# Patient Record
Sex: Female | Born: 1950 | Race: White | Hispanic: No | Marital: Married | Smoking: Former smoker
Health system: Southern US, Community
[De-identification: ages and names within clinical notes are randomized; demographics above are authoritative.]

## PROBLEM LIST (undated history)

## (undated) DIAGNOSIS — M810 Age-related osteoporosis without current pathological fracture: Secondary | ICD-10-CM

## (undated) DIAGNOSIS — E785 Hyperlipidemia, unspecified: Secondary | ICD-10-CM

## (undated) DIAGNOSIS — J449 Chronic obstructive pulmonary disease, unspecified: Secondary | ICD-10-CM

## (undated) DIAGNOSIS — T4145XA Adverse effect of unspecified anesthetic, initial encounter: Secondary | ICD-10-CM

## (undated) DIAGNOSIS — F32A Depression, unspecified: Secondary | ICD-10-CM

## (undated) DIAGNOSIS — C3490 Malignant neoplasm of unspecified part of unspecified bronchus or lung: Secondary | ICD-10-CM

## (undated) DIAGNOSIS — T8859XA Other complications of anesthesia, initial encounter: Secondary | ICD-10-CM

## (undated) DIAGNOSIS — F329 Major depressive disorder, single episode, unspecified: Secondary | ICD-10-CM

## (undated) HISTORY — DX: Hyperlipidemia, unspecified: E78.5

## (undated) HISTORY — PX: CHOLECYSTECTOMY: SHX55

## (undated) HISTORY — DX: Age-related osteoporosis without current pathological fracture: M81.0

## (undated) HISTORY — DX: Major depressive disorder, single episode, unspecified: F32.9

## (undated) HISTORY — DX: Adverse effect of unspecified anesthetic, initial encounter: T41.45XA

## (undated) HISTORY — DX: Depression, unspecified: F32.A

## (undated) HISTORY — DX: Chronic obstructive pulmonary disease, unspecified: J44.9

## (undated) HISTORY — DX: Malignant neoplasm of unspecified part of unspecified bronchus or lung: C34.90

## (undated) HISTORY — PX: POLYPECTOMY: SHX149

## (undated) HISTORY — PX: VAGINAL HYSTERECTOMY: SUR661

## (undated) HISTORY — DX: Other complications of anesthesia, initial encounter: T88.59XA

---

## 2003-01-18 ENCOUNTER — Ambulatory Visit (HOSPITAL_COMMUNITY): Admission: RE | Admit: 2003-01-18 | Discharge: 2003-01-18 | Payer: Self-pay | Admitting: Family Medicine

## 2003-01-18 ENCOUNTER — Encounter: Payer: Self-pay | Admitting: Family Medicine

## 2003-02-23 ENCOUNTER — Encounter (HOSPITAL_COMMUNITY): Admission: RE | Admit: 2003-02-23 | Discharge: 2003-03-25 | Payer: Self-pay | Admitting: Family Medicine

## 2004-11-09 ENCOUNTER — Ambulatory Visit: Payer: Self-pay | Admitting: Internal Medicine

## 2006-06-29 DIAGNOSIS — E785 Hyperlipidemia, unspecified: Secondary | ICD-10-CM | POA: Insufficient documentation

## 2006-06-29 DIAGNOSIS — F329 Major depressive disorder, single episode, unspecified: Secondary | ICD-10-CM

## 2006-06-30 ENCOUNTER — Ambulatory Visit: Payer: Self-pay | Admitting: Internal Medicine

## 2006-06-30 DIAGNOSIS — R634 Abnormal weight loss: Secondary | ICD-10-CM

## 2006-06-30 LAB — CONVERTED CEMR LAB
ALT: 12 units/L (ref 0–40)
AST: 20 units/L (ref 0–37)
Albumin: 4.3 g/dL (ref 3.5–5.2)
Alkaline Phosphatase: 74 units/L (ref 39–117)
BUN: 8 mg/dL (ref 6–23)
Basophils Absolute: 0 10*3/uL (ref 0.0–0.1)
Basophils Relative: 0.4 % (ref 0.0–1.0)
Bilirubin, Direct: 0.1 mg/dL (ref 0.0–0.3)
CO2: 32 meq/L (ref 19–32)
Calcium: 9.8 mg/dL (ref 8.4–10.5)
Chloride: 104 meq/L (ref 96–112)
Chol/HDL Ratio, serum: 4
Cholesterol, target level: 200 mg/dL
Cholesterol: 214 mg/dL (ref 0–200)
Creatinine, Ser: 0.8 mg/dL (ref 0.4–1.2)
Eosinophil percent: 0.8 % (ref 0.0–5.0)
GFR calc non Af Amer: 79 mL/min
Glomerular Filtration Rate, Af Am: 95 mL/min/{1.73_m2}
Glucose, Bld: 86 mg/dL (ref 70–99)
HCT: 47.2 % — ABNORMAL HIGH (ref 36.0–46.0)
HDL goal, serum: 40 mg/dL
HDL: 54.1 mg/dL (ref >39.0)
Hemoglobin: 15.8 g/dL — ABNORMAL HIGH (ref 12.0–15.0)
LDL DIRECT: 136.8 mg/dL
LDL Goal: 130 mg/dL
Lymphocytes Relative: 20.7 % (ref 12.0–46.0)
MCHC: 33.6 g/dL (ref 30.0–36.0)
MCV: 87 fL (ref 78.0–100.0)
Monocytes Absolute: 0.4 10*3/uL (ref 0.2–0.7)
Monocytes Relative: 4.6 % (ref 3.0–11.0)
Neutro Abs: 6.3 10*3/uL (ref 1.4–7.7)
Neutrophils Relative %: 73.5 % (ref 43.0–77.0)
Platelets: 311 10*3/uL (ref 150–400)
Potassium: 4.2 meq/L (ref 3.5–5.1)
RBC: 5.42 M/uL — ABNORMAL HIGH (ref 3.87–5.11)
RDW: 12.6 % (ref 11.5–14.6)
Sodium: 142 meq/L (ref 135–145)
TSH: 0.6 microintl units/mL (ref 0.35–5.50)
Total Bilirubin: 1 mg/dL (ref 0.3–1.2)
Total Protein: 7.9 g/dL (ref 6.0–8.3)
Triglyceride fasting, serum: 123 mg/dL (ref 0–149)
VLDL: 25 mg/dL (ref 0–40)
WBC: 8.6 10*3/uL (ref 4.5–10.5)

## 2008-08-19 DIAGNOSIS — C3490 Malignant neoplasm of unspecified part of unspecified bronchus or lung: Secondary | ICD-10-CM

## 2008-08-19 HISTORY — PX: LUNG CANCER SURGERY: SHX702

## 2008-08-19 HISTORY — DX: Malignant neoplasm of unspecified part of unspecified bronchus or lung: C34.90

## 2008-11-11 ENCOUNTER — Encounter: Payer: Self-pay | Admitting: Internal Medicine

## 2008-12-24 ENCOUNTER — Inpatient Hospital Stay (HOSPITAL_COMMUNITY): Admission: EM | Admit: 2008-12-24 | Discharge: 2008-12-28 | Payer: Self-pay | Admitting: Internal Medicine

## 2008-12-24 ENCOUNTER — Ambulatory Visit: Payer: Self-pay | Admitting: Internal Medicine

## 2008-12-26 ENCOUNTER — Encounter: Payer: Self-pay | Admitting: Internal Medicine

## 2008-12-27 ENCOUNTER — Encounter (INDEPENDENT_AMBULATORY_CARE_PROVIDER_SITE_OTHER): Payer: Self-pay | Admitting: Interventional Radiology

## 2008-12-27 ENCOUNTER — Encounter: Payer: Self-pay | Admitting: Internal Medicine

## 2008-12-30 ENCOUNTER — Ambulatory Visit: Payer: Self-pay | Admitting: Internal Medicine

## 2009-01-02 ENCOUNTER — Ambulatory Visit: Payer: Self-pay | Admitting: Internal Medicine

## 2009-01-05 ENCOUNTER — Ambulatory Visit: Payer: Self-pay | Admitting: Internal Medicine

## 2009-01-05 DIAGNOSIS — Z85118 Personal history of other malignant neoplasm of bronchus and lung: Secondary | ICD-10-CM

## 2009-01-05 DIAGNOSIS — Z08 Encounter for follow-up examination after completed treatment for malignant neoplasm: Secondary | ICD-10-CM

## 2009-01-06 ENCOUNTER — Encounter: Payer: Self-pay | Admitting: Internal Medicine

## 2009-01-10 ENCOUNTER — Ambulatory Visit (HOSPITAL_COMMUNITY): Admission: RE | Admit: 2009-01-10 | Discharge: 2009-01-10 | Payer: Self-pay | Admitting: Internal Medicine

## 2009-01-10 LAB — COMPREHENSIVE METABOLIC PANEL
ALT: 10 U/L (ref 0–35)
CO2: 29 mEq/L (ref 19–32)
Calcium: 9.8 mg/dL (ref 8.4–10.5)
Chloride: 103 mEq/L (ref 96–112)
Creatinine, Ser: 0.71 mg/dL (ref 0.40–1.20)
Total Protein: 7.3 g/dL (ref 6.0–8.3)

## 2009-01-10 LAB — CBC WITH DIFFERENTIAL/PLATELET
BASO%: 0.4 % (ref 0.0–2.0)
Basophils Absolute: 0 10*3/uL (ref 0.0–0.1)
HCT: 41.5 % (ref 34.8–46.6)
HGB: 14.3 g/dL (ref 11.6–15.9)
MCHC: 34.3 g/dL (ref 31.5–36.0)
MONO#: 0.4 10*3/uL (ref 0.1–0.9)
NEUT#: 4.2 10*3/uL (ref 1.5–6.5)
NEUT%: 66 % (ref 38.4–76.8)
WBC: 6.4 10*3/uL (ref 3.9–10.3)
lymph#: 1.7 10*3/uL (ref 0.9–3.3)

## 2009-01-20 ENCOUNTER — Ambulatory Visit: Payer: Self-pay | Admitting: Cardiology

## 2009-01-23 ENCOUNTER — Telehealth: Payer: Self-pay | Admitting: Internal Medicine

## 2009-01-25 ENCOUNTER — Ambulatory Visit: Payer: Self-pay | Admitting: Internal Medicine

## 2009-01-26 ENCOUNTER — Ambulatory Visit: Payer: Self-pay | Admitting: Cardiothoracic Surgery

## 2009-01-30 ENCOUNTER — Inpatient Hospital Stay (HOSPITAL_COMMUNITY): Admission: RE | Admit: 2009-01-30 | Discharge: 2009-02-04 | Payer: Self-pay | Admitting: Cardiothoracic Surgery

## 2009-01-30 ENCOUNTER — Encounter: Payer: Self-pay | Admitting: Cardiothoracic Surgery

## 2009-01-30 ENCOUNTER — Encounter: Payer: Self-pay | Admitting: Internal Medicine

## 2009-01-30 ENCOUNTER — Ambulatory Visit: Payer: Self-pay | Admitting: Cardiothoracic Surgery

## 2009-02-10 ENCOUNTER — Ambulatory Visit: Payer: Self-pay | Admitting: Cardiothoracic Surgery

## 2009-02-21 ENCOUNTER — Encounter: Payer: Self-pay | Admitting: Internal Medicine

## 2009-02-21 ENCOUNTER — Encounter: Admission: RE | Admit: 2009-02-21 | Discharge: 2009-02-21 | Payer: Self-pay | Admitting: Cardiothoracic Surgery

## 2009-02-21 ENCOUNTER — Ambulatory Visit: Payer: Self-pay | Admitting: Cardiothoracic Surgery

## 2009-04-27 ENCOUNTER — Encounter: Admission: RE | Admit: 2009-04-27 | Discharge: 2009-04-27 | Payer: Self-pay | Admitting: Cardiothoracic Surgery

## 2009-04-27 ENCOUNTER — Ambulatory Visit: Payer: Self-pay | Admitting: Cardiothoracic Surgery

## 2009-05-10 ENCOUNTER — Encounter: Payer: Self-pay | Admitting: Internal Medicine

## 2009-06-06 ENCOUNTER — Ambulatory Visit: Payer: Self-pay | Admitting: Internal Medicine

## 2009-06-06 DIAGNOSIS — J449 Chronic obstructive pulmonary disease, unspecified: Secondary | ICD-10-CM | POA: Insufficient documentation

## 2009-07-07 ENCOUNTER — Ambulatory Visit: Payer: Self-pay | Admitting: Internal Medicine

## 2009-08-24 ENCOUNTER — Ambulatory Visit: Payer: Self-pay | Admitting: Cardiovascular Disease

## 2009-08-31 ENCOUNTER — Ambulatory Visit: Payer: Self-pay | Admitting: Cardiothoracic Surgery

## 2009-08-31 ENCOUNTER — Encounter: Payer: Self-pay | Admitting: Internal Medicine

## 2009-09-08 ENCOUNTER — Telehealth: Payer: Self-pay | Admitting: Internal Medicine

## 2010-02-22 ENCOUNTER — Ambulatory Visit: Payer: Self-pay | Admitting: Internal Medicine

## 2010-02-27 ENCOUNTER — Ambulatory Visit: Payer: Self-pay | Admitting: Internal Medicine

## 2010-03-05 LAB — CONVERTED CEMR LAB: Vit D, 25-Hydroxy: 19 ng/mL — ABNORMAL LOW (ref 30–89)

## 2010-03-08 ENCOUNTER — Ambulatory Visit: Payer: Self-pay | Admitting: Cardiothoracic Surgery

## 2010-03-08 ENCOUNTER — Encounter: Payer: Self-pay | Admitting: Internal Medicine

## 2010-04-04 ENCOUNTER — Telehealth (INDEPENDENT_AMBULATORY_CARE_PROVIDER_SITE_OTHER): Payer: Self-pay | Admitting: *Deleted

## 2010-04-25 ENCOUNTER — Ambulatory Visit: Payer: Self-pay | Admitting: Internal Medicine

## 2010-04-30 ENCOUNTER — Telehealth: Payer: Self-pay | Admitting: Internal Medicine

## 2010-05-01 LAB — CONVERTED CEMR LAB: Vit D, 25-Hydroxy: 38 ng/mL (ref 30–89)

## 2010-05-02 ENCOUNTER — Telehealth: Payer: Self-pay | Admitting: Internal Medicine

## 2010-05-14 ENCOUNTER — Telehealth (INDEPENDENT_AMBULATORY_CARE_PROVIDER_SITE_OTHER): Payer: Self-pay | Admitting: *Deleted

## 2010-07-06 ENCOUNTER — Ambulatory Visit: Payer: Self-pay | Admitting: Internal Medicine

## 2010-09-06 ENCOUNTER — Ambulatory Visit: Admit: 2010-09-06 | Payer: Self-pay | Admitting: Cardiothoracic Surgery

## 2010-09-09 ENCOUNTER — Encounter: Payer: Self-pay | Admitting: Cardiothoracic Surgery

## 2010-09-10 ENCOUNTER — Ambulatory Visit: Payer: Self-pay | Admitting: Cardiology

## 2010-09-12 ENCOUNTER — Encounter: Payer: Self-pay | Admitting: Internal Medicine

## 2010-09-12 ENCOUNTER — Ambulatory Visit
Admission: RE | Admit: 2010-09-12 | Discharge: 2010-09-12 | Payer: Self-pay | Source: Home / Self Care | Attending: Internal Medicine | Admitting: Internal Medicine

## 2010-09-18 NOTE — Assessment & Plan Note (Signed)
Summary: rov   Copy to:  Amil Amen Primary Provider/Referring Provider:  Birdie Sons MD  CC:  follow up---review of ct scan results--with the humidity she wanted to know if you  wanted to increase the symbicort back to the 160??.  History of Present Illness:  [name] is an ex-40 pack smoker. 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 Preop PFTs were normal although she had CT evidence of emphysema. Post surgery  Spirometry showed Severe Obstruction: Fev1 1.19L/47% and she was started on spiriva and symbicort in Oct 2010. After this spirometry and walking desaturation normalized by Nov 2010 I started her on spiriv and symbicort.  Last seen Nov 2010. AFter that she followed with Dr. Tyrone Sage in Jan 2011. CT scan chest showed remission of LUL cancer but a new RUL infiltrate. Overall she has been doing well. No new complaints. Summer heat has her slightly more dyspneic. SHe is wondering about increasing her symbicort dose. Enjoyed vacation in [place] recently. Not exercising but has good quality of life. Here iwth husband.   Preventive Screening-Counseling & Management  Alcohol-Tobacco     Smoking Status: quit     Smoke Cessation Stage: quit     Packs/Day: 1.0     Year Started: 1969     Year Quit: 12/2008     Pack years: 40     Tobacco Counseling: not to resume use of tobacco products  Allergies: 1)  ! Penicillin V Potassium (Penicillin V Potassium) 2)  ! Codeine  Comments:  Nurse/Medical Assistant: The patient's medications and allergies were reviewed with the patient and were updated in the Medication and Allergy Lists.  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/23/2008 Daughter is [name] and works in [employer]  Risk Factors: Exercise: no (07/05/2006)  Risk Factors: Smoking Status: quit (02/27/2010) Packs/Day: 1.0 (02/27/2010) Passive Smoke Exposure: no (07/05/2006)  Past Medical History: Reviewed history from 07/07/2009 and no changes required. Depression Hyperlipidemia LLL mass - NSCLC COPD  >Gold stage 0 prior to Lobectomy in June 2010 >Gold stage 3 in October 2010 with exertional desaturations  > Normalized PFT Nov 2010 with spiriva and symbicort without exertional desaturations  Past Surgical History: Reviewed history from 06/06/2009 and no changes required. Cholecystectomy Hysterectomy Left Upper Lobectomy 2010  Family History: Reviewed history from 07/05/2006 and no changes required. mother deceased---unknown cause 41yo father deceased---cerebral aneurysm 28yo  Social History: Reviewed history from 01/05/2009 and no changes required. 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/23/2008 Daughter is [name] and works in Adult nurse Smoking Status:  quit Packs/Day:  1.0 Pack years:  40  Review of Systems  The patient denies shortness of breath with activity, shortness of breath at rest, productive cough, non-productive cough, coughing up blood, chest pain, irregular heartbeats, acid heartburn, indigestion, loss of appetite, weight change, abdominal pain, difficulty swallowing, sore throat, tooth/dental problems, headaches, nasal congestion/difficulty breathing through nose, sneezing, itching, ear ache, anxiety, depression, hand/feet swelling, joint stiffness or pain, rash, change in color of mucus, and fever.    Vital Signs:  Patient profile:   60 year old female Height:      63 inches Weight:      130 pounds BMI:     23.11 O2 Sat:  97 % on Room air Temp:     97.2 degrees F oral Pulse rate:   85 / minute Cuff size:   regular  Vitals Entered By: Randell Loop CMA (February 27, 2010 2:22 PM)  O2 Flow:  Room air CC: follow  up---review of ct scan results--with the humidity she wanted to know if you  wanted to increase the symbicort back to the 160?? Is Patient Diabetic? No Pain Assessment Patient in pain? no      Comments meds and allergies updated with pt today Randell Loop CMA  February 27, 2010 2:28 PM    Physical Exam  General:  well developed, well nourished, in no acute distress Head:  normocephalic and atraumatic Eyes:  PERRLA/EOM intact; conjunctiva and sclera clear Ears:  TMs intact and clear with normal canals Nose:  no deformity, discharge, inflammation, or lesions Mouth:  no deformity or lesions Neck:  no masses, thyromegaly, or abnormal cervical nodes Chest Wall:  no deformities noted Lungs:  decreased BS bilateral and prolonged exhilation.   Heart:  regular rate and rhythm, S1, S2 without murmurs, rubs, gallops, or clicks Abdomen:  bowel sounds positive; abdomen soft and non-tender without masses, or organomegaly Msk:  no deformity or scoliosis noted with normal posture Pulses:  pulses normal Extremities:  no clubbing, cyanosis, edema, or deformity noted Neurologic:  CN II-XII grossly intact with normal reflexes, coordination, muscle strength and tone Skin:  intact without lesions or rashes Cervical Nodes:  no significant adenopathy Axillary Nodes:  no significant adenopathy Psych:  alert and cooperative; normal mood and affect; normal attention span and concentration   CT of Chest  Procedure date:  02/22/2010  Findings:      LUL - lung cancer - no evidence of recurrence RUL infilrate from jan 2011 - signficiantly improved  Impression & Recommendations:  Problem # 1:  COPD (ICD-496) Assessment Unchanged stable copd  plan contnue spiriva she can try higher dose symbicort during the summer encourage her to start home exercises (could not attend rehab due to schedule conflicts)  check Vitamin D level  Problem # 2:  CARCINOMA, LUNG, UPPER LOBE (ICD-162.3) Assessment:  Unchanged  s/p LEft Upper Lobe Lobectomy June 2010 for Stage 1B (T2A, N0, M0, 3.1cm) Squamous cell carcinoma of the lung without folllowup Chemo/XRT  per Dr. Shirline Frees. She and family wish to follow with me for  post lobectomy cancer surveillance.  1 year surveillance CT on 02/22/2010 shows no evidence of recurrence  plan next CT Dec 2011 (18th month CT) (she wil need ct q 6 months through june 2012 and then annually after that per new guidelines)  Problem # 3:  PULMONARY NODULE, RIGHT UPPER LOBE (ICD-518.89) Assessment: New  This appeared new on surveillance CT during Jan 2011. Nearly resolved July 2011.  plan repeat ct dec 2011  Orders: Est. Patient Level IV ([address])  Medications Added to Medication List This Visit: 1)  Symbicort 80-4.5 Mcg/act Aero (Budesonide-formoterol fumarate) .... 2 puffs two times a day  rinse after each use  Other Orders: Radiology Referral (Radiology) T- * Misc. Laboratory test [phone])  Patient Instructions: 1)  you can take 2 samples of the 160 symbicort 2)  see if the higher dose of symbicort helps you in the summer 3)  otherwise continue spiriva and symbicort as before 4)  next cT SCAN CHEST IN  6 months 5)  return to see me in 6 months after ct scan chest 6)  check vitamin d level

## 2010-09-18 NOTE — Letter (Signed)
Summary: [facility]  [facility]   Imported By: Maryln Gottron 09/21/2009 12:12:35  _____________________________________________________________________  External Attachment:    Type:   Image     Comment:   External Document

## 2010-09-18 NOTE — Progress Notes (Signed)
Summary: AECOPD  Pt req work ov today to see Dr Cato Mulligan or a med called in  Phone Note Call from Patient Call back at Home Phone [phone]   Caller: Patient Summary of Call: Pt called and is req work in appt to see Dr Cato Mulligan today for sore throat and cough. If not pt req a med called in to [facility] in [place] [phone]  Initial call taken by: Lucy Antigua,  April 30, 2010 9:49 AM  Follow-up for Phone Call        I ran into daughter Jackilyn Umphlett in the lhallways. She interrupted me and had me speak to patient. Patient desscribed nasal congestion, fever, and dry cough. All c/w URI and early AECOPD. Feels very run down. FEver broke today. REcommended doxycycline x 5 days. IF worse wtih wheeze or dyspnea then needs aECOPD. I will close this note out Follow-up by: Kalman Shan MD,  April 30, 2010 3:00 PM    New/Updated Medications: DOXYCYCLINE MONOHYDRATE 100 MG  CAPS (DOXYCYCLINE MONOHYDRATE) By mouth twice daily after meals and avoid sunlight Prescriptions: DOXYCYCLINE MONOHYDRATE 100 MG  CAPS (DOXYCYCLINE MONOHYDRATE) By mouth twice daily after meals and avoid sunlight  #12 x 0   Entered and Authorized by:   Kalman Shan MD   Signed by:   Kalman Shan MD on 04/30/2010   Method used:   Electronically to        [facility] [address] [phone]* (retail)       [address] [place]  [address]       Ph: [phone]       Fax: [phone]   RxID:   [id number]

## 2010-09-18 NOTE — Progress Notes (Signed)
Summary: vitamin d level to be rechecked week of 9.5.11  Phone Note Call from Patient   Caller: gail Call For: ramaswamy Summary of Call: will finish vitd on 04/24/10 MR told her to check her vit again when does she need to come  Initial call taken by: Oneita Jolly,  April 04, 2010 3:27 PM  Follow-up for Phone Call        called spoke with patient.  lab appt scheduled for the week of 9.5.11 to recheck her vitamin d level.  pt aware she just needs to go to the lab and then may leave.  pt verbalized her understanding. Follow-up by: Boone Master CNA/MA,  April 04, 2010 3:49 PM

## 2010-09-18 NOTE — Assessment & Plan Note (Signed)
Summary: Acute NP office visit - COPD / sinus inf   Primary Provider/Referring Provider:  Birdie Sons MD  CC:  dry cough, sinus pressure/congestion with yellow/green drainage, PND, fever up to 102, and wheezing x5days.  History of Present Illness: 60 yo female ex-40 pack smoker. 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 Preop PFTs were normal although she had CT evidence of emphysema. Post surgery  Spirometry showed Severe Obstruction: Fev1 1.19L/47% and she was started on spiriva and symbicort in Oct 2010. After this spirometry and walking desaturation normalized by 11-21-2010I started her on spiriv and symbicort.  02/2010--Last seen 07/09/2009. AFter that she followed with Dr. Tyrone Sage in Jan 2011. CT scan chest showed remission of LUL cancer but a new RUL infiltrate. Overall she has been doing well. No new complaints. Summer heat has her slightly more dyspneic. SHe is wondering about increasing her symbicort dose. Enjoyed vacation in [place] recently. Not exercising but has good quality of life.   July 06, 2010--Presents for an acute office visit. Complains of dry cough, sinus pressure/congestion with yellow/green drainage, PND, fever up to 102, wheezing x5days. OTC not helping. Denies chest pain, orthopnea, hemoptysis, fever, n/v/d, edema, headache.  She has been doing well until last week. Eating well.   Medications Prior to Update: 1)  Ventolin Hfa 108 (90 Base) Mcg/act Aers (Albuterol Sulfate) .... As Needed 2)  Spiriva Handihaler 18 Mcg Caps (Tiotropium Bromide Monohydrate) .... One Puff Once A Day 3)  Symbicort 80-4.5 Mcg/act Aero (Budesonide-Formoterol Fumarate) .... 2 Puffs Two Times A Day  Rinse After Each Use 4)  Vitamin D3 50,000 Units .... Once A Week X8weeks 5)  Calcium 1000mg  .... Once A Day  Current Medications (verified): 1)  Ventolin Hfa 108 (90 Base) Mcg/act Aers (Albuterol Sulfate)  .... Inhale 2 Puffs Every Four Hours As Needed 2)  Spiriva Handihaler 18 Mcg Caps (Tiotropium Bromide Monohydrate) .... One Puff Once A Day 3)  Symbicort 80-4.5 Mcg/act Aero (Budesonide-Formoterol Fumarate) .... 2 Puffs Two Times A Day  Rinse After Each Use 4)  Viactiv Multi-Vitamin  Chew (Multiple Vitamins-Calcium) .Marland Kitchen.. 1 Chew By Mouth Two Times A Day  Allergies (verified): 1)  ! Penicillin V Potassium (Penicillin V Potassium) 2)  ! 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 is Marcia Brash and works in Adult nurse  Risk Factors: Smoking Status: quit (02/27/2010) Packs/Day: 1.0 (02/27/2010) Passive Smoke Exposure: no (July 09, 2006)  Review of Systems      See HPI  Vital Signs:  Patient profile:   60 year old female Height:      63 inches Weight:      129.38 pounds BMI:     23.00 O2 Sat:      97 % on Room air Temp:     97.6 degrees F oral Pulse rate:   95 / minute BP sitting:   114 / 72  (left  arm) Cuff size:   regular  Vitals Entered By: Boone Master CNA/MA (July 06, 2010 4:08 PM)  O2 Flow:  Room air CC: dry cough, sinus pressure/congestion with yellow/green drainage, PND, fever up to 102, wheezing x5days Is Patient Diabetic? No Comments Medications reviewed with patient Daytime contact number verified with patient. Boone Master CNA/MA  July 06, 2010 4:05 PM    Physical Exam  Additional Exam:  GEN: A/Ox3; pleasant , NAD HEENT:  [place]/AT, , EACs-clear, TMs-wnl, NOSE-clear, THROAT-clear NECK:  Supple w/ fair ROM; no JVD; normal carotid impulses w/o bruits; no thyromegaly or nodules palpated; no lymphadenopathy. RESP  Coarse BS w/ faint  exp wheeze  CARD:  RRR, no m/r/g   GI:   Soft & nt; nml bowel sounds; no organomegaly or masses detected. [place]: Warm bil,  no calf tenderness edema, clubbing, pulses intact Neuro:intact w/ no focal deficits noted.    Impression & Recommendations:  Problem # 1:  COPD (ICD-496)  Flare  Plan xopenex neb  Omnicef 300mg  two times a day for 7days  Prednisone taper over next week.  Mucinex DM two times a day as needed cough/congestion  Hydromet 1-2 tsp every 4-6 hrs as needed cough-may make you sleepy.  Zyrtec 10mg  at bedtime as needed drainage.  Please contact office for sooner follow up if symptoms do not improve or worsen  follow up Dr. Marchelle Gearing in 6-8 weeks   Orders: Est. Patient Level IV ([address])  Medications Added to Medication List This Visit: 1)  Viactiv Multi-vitamin Chew (Multiple vitamins-calcium) .Marland Kitchen.. 1 chew by mouth two times a day 2)  Ventolin Hfa 108 (90 Base) Mcg/act Aers (Albuterol sulfate) .... Inhale 2 puffs every four hours as needed 3)  Cefdinir 300 Mg Caps (Cefdinir) .Marland Kitchen.. 1 by mouth two times a day 4)  Prednisone 10 Mg Tabs (Prednisone) .... 4 tabs for 2 days, then 3 tabs for 2 days, 2 tabs for 2 days, then 1 tab for 2 days, then stop 5)  Hydromet 5-1.5 Mg/39ml Syrp (Hydrocodone-homatropine) .Marland Kitchen.. 1-2 tsp every 4-6 hr as needed cough  Complete Medication List: 1)  Spiriva Handihaler 18 Mcg Caps (Tiotropium bromide monohydrate) .... One puff once a day 2)  Symbicort 80-4.5 Mcg/act Aero (Budesonide-formoterol fumarate) .... 2 puffs two times a day  rinse after each use 3)  Viactiv Multi-vitamin Chew (Multiple vitamins-calcium) .Marland Kitchen.. 1 chew by mouth two times a day 4)   Ventolin Hfa 108 (90 Base) Mcg/act Aers (Albuterol sulfate) .... Inhale 2 puffs every four hours as needed 5)  Cefdinir 300 Mg Caps (Cefdinir) .Marland Kitchen.. 1 by mouth two times a day 6)  Prednisone 10 Mg Tabs (Prednisone) .... 4 tabs for 2 days, then 3 tabs for 2 days, 2 tabs for 2 days, then 1 tab for 2 days, then stop 7)  Hydromet 5-1.5 Mg/2ml Syrp (Hydrocodone-homatropine) .Marland Kitchen.. 1-2 tsp every 4-6 hr as needed cough  Patient Instructions: 1)  Omnicef 300mg  two times a day for 7days  2)  Prednisone taper over next week.  3)  Mucinex DM two times a day as needed cough/congestion  4)  Hydromet 1-2 tsp every 4-6 hrs as needed cough-may make you sleepy.  5)  Zyrtec 10mg  at bedtime as needed drainage.  6)  Please contact office for sooner follow up if symptoms do not improve or worsen  7)  follow up Dr. Marchelle Gearing in 6-8 weeks  Prescriptions: HYDROMET 5-1.5 MG/5ML SYRP (HYDROCODONE-HOMATROPINE) 1-2 tsp every 4-6 hr as needed cough  #8 oz  x 0   Entered and Authorized by:   Rubye Oaks NP   Signed by:   Tammy Parrett NP on 07/06/2010   Method used:   Print then Give to Patient   RxID:   [id number] PREDNISONE 10 MG TABS (PREDNISONE) 4 tabs for 2 days, then 3 tabs for 2 days, 2 tabs for 2 days, then 1 tab for 2 days, then stop  #20 x 0   Entered and Authorized by:   Rubye Oaks NP   Signed by:   Tammy Parrett NP on 07/06/2010   Method used:   Electronically to        [facility]. [phone]* (retail)       [address] [place], [place]  [address]       Ph: [phone]       Fax: [phone]   RxID:   [id number] CEFDINIR 300 MG CAPS (CEFDINIR) 1 by mouth two times a day  #14 x 0   Entered and Authorized by:   Rubye Oaks NP   Signed by:   Tammy Parrett NP on 07/06/2010   Method used:   Electronically to        [facility]. [phone]* (retail)       [address] [place], [place]  [address]       Ph: [phone]       Fax: [phone]   RxID:    [id number]     Appended Document: Orders Update - neb tx    Clinical Lists Changes  Orders: Added new Service order of Nebulizer Tx (31517) - Signed

## 2010-09-18 NOTE — Progress Notes (Signed)
Summary: returned call/ results  Phone Note Call from Patient Call back at Work Phone [phone]   Caller: Patient Call For: Kodi Guerrera Summary of Call: pt's spouse gail returned call from jennifer. call [phone] Initial call taken by: Tivis Ringer, CNA,  September 08, 2009 9:04 AM  Follow-up for Phone Call        pt advised of CT results and the need to have repeat CT in 6 months with f/u after CT.  Reminder has been placed for ROV in 6 months. PT aware. Order palced and sent to [facility]. Carron Curie CMA  September 08, 2009 10:19 AM

## 2010-09-18 NOTE — Progress Notes (Signed)
Summary: needs sterpids for aecopd  Phone Note Call from Patient   Summary of Call: PAtient daughter Marcia Brash came to office to update on mom the ptaient. Spoke to patient. Continues to be afebrile but cough is more esp at night and noew new dyspnea. Denies sputum, wheeze, edema, syncope, fever nausea vomit. She DOES NOT want to come to office on acute basis or go to ER. Short course prednisone called in Initial call taken by: Kalman Shan MD,  May 02, 2010 1:45 PM    New/Updated Medications: PREDNISONE 10 MG  TABS (PREDNISONE) Take 4 tablets daily x2 days, then 3 tablets daily x 2 days, then 2 tablets daily x2 days, then 1 tablet daily x2 days, then stop Prescriptions: PREDNISONE 10 MG  TABS (PREDNISONE) Take 4 tablets daily x2 days, then 3 tablets daily x 2 days, then 2 tablets daily x2 days, then 1 tablet daily x2 days, then stop  #20 x 0   Entered and Authorized by:   Kalman Shan MD   Signed by:   Kalman Shan MD on 05/02/2010   Method used:   Electronically to        [facility] [address] [phone]* (retail)       [address], [place]  [address]       Ph: [phone]       Fax: [phone]   RxID:   [id number]

## 2010-09-18 NOTE — Letter (Signed)
Summary: [facility]  [facility]   Imported By: Maryln Gottron 03/22/2010 11:03:49  _____________________________________________________________________  External Attachment:    Type:   Image     Comment:   External Document

## 2010-09-18 NOTE — Progress Notes (Signed)
Summary: Spiriva refilled  Phone Note Call from Patient   Caller: gail Call For: ramaswamy Summary of Call: needs [facility]ills [email or web address] [place] Initial call taken by: Oneita Jolly,  May 14, 2010 2:12 PM  Follow-up for Phone Call        Spoke with Dondra Spry and advised that spiriva refill was sent to pharm. Follow-up by: Vernie Murders,  May 14, 2010 2:58 PM    Prescriptions: SPIRIVA HANDIHALER 18 MCG CAPS (TIOTROPIUM BROMIDE MONOHYDRATE) one puff once a day  #30 x 5   Entered by:   Vernie Murders   Authorized by:   Kalman Shan MD   Signed by:   Vernie Murders on 05/14/2010   Method used:   Electronically to        [facility] [address] [phone]* (retail)       [address], [place]  [address]       Ph: [phone]       Fax: [phone]   RxID:   [id number]

## 2010-09-20 NOTE — Assessment & Plan Note (Signed)
Summary: 6-8 week return/mhh   Visit Type:  Follow-up Copy to:  Amil Amen Primary Provider/Referring Provider:  Birdie Sons MD  CC:  Pt her for follow-up after CT. pt denies any complaints at this time. Marland Kitchen  History of Present Illness: 60 yo female ex-40 pack smoker. 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 Preop PFTs were normal although she had CT evidence of emphysema. Post surgery  Spirometry showed Severe Obstruction: Fev1 1.19L/47% and she was started on spiriva and symbicort in Oct 2010. After this spirometry and walking desaturation normalized by Nov 2010.    September 12, 2010: Followup COPD, s/p LUL lung cancer - lobectomy June 2010 and new RUL nodule since kjuly 2011. Since last visit has has 2 AECOPD - sept 2011 and nov 2011 both treated as outpatient with usual abx/steroids.Now doing well. 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 Baseline mild cough. CT 09/10/2010 shows LUL cancer in remission and RUL nodule as stable when compared to july 2011   Preventive Screening-Counseling & Management  Alcohol-Tobacco     Smoking Status: quit     Smoke Cessation Stage: quit     Packs/Day: 1.0     Year Started: 1969     Year Quit: 12/2008     Pack years: 40     Passive Smoke Exposure: no     Tobacco Counseling: not to resume use of tobacco products  Current Medications (verified): 1)  Spiriva Handihaler 18 Mcg Caps (Tiotropium Bromide Monohydrate) .... One Puff Once A Day 2)  Symbicort 80-4.5 Mcg/act Aero (Budesonide-Formoterol Fumarate) .... 2 Puffs Two Times A Day  Rinse After Each Use 3)  Viactiv Multi-Vitamin  Chew (Multiple Vitamins-Calcium) .Marland Kitchen.. 1 Chew By Mouth Two Times A Day 4)  Ventolin Hfa 108 (90 Base) Mcg/act Aers (Albuterol Sulfate) .... Inhale 2 Puffs Every Four Hours As Needed  Allergies  (verified): 1)  ! Penicillin V Potassium (Penicillin V Potassium) 2)  ! Codeine  Past History:  Past medical, surgical, family and social histories (including risk factors) reviewed, and no changes noted (except as noted below).  Past Medical History: Depression Hyperlipidemia LLL mass - NSCLC COPD  >Gold stage 0 prior to Lobectomy in June 2010 >Gold stage 3 in October 2010 with exertional desaturations  > Normalized PFT Nov 2010 with spiriva and symbicort without exertional desaturations  - No exertional desaturation 09/12/2010 AECOPD  - sept 2011  - Nov 2011   Past Surgical History: Reviewed history from 06/06/2009 and no changes required. Cholecystectomy Hysterectomy Left Upper Lobectomy 2010  Family History: Reviewed history from 06/30/2006 and no changes required. mother deceased---unknown cause 41yo father deceased---cerebral aneurysm 78yo  Social History: Reviewed history from 01/05/2009 and no changes required. 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/23/2008 Daughter is Marcia Brash and works in [place]  Review of Systems       The patient complains of dyspnea on exertion.  The patient denies anorexia, fever, weight loss, weight gain, vision loss, decreased hearing, hoarseness, chest pain, syncope, peripheral edema, prolonged cough, headaches, hemoptysis, abdominal pain, melena, hematochezia, severe indigestion/heartburn, hematuria, incontinence, genital sores, muscle weakness, suspicious skin lesions, transient blindness, difficulty walking, depression, unusual weight change, abnormal bleeding, enlarged lymph nodes, angioedema, breast masses, and testicular masses.    Vital Signs:  Patient profile:   60 year  old female Height:      63 inches Weight:      132.50 pounds BMI:     23.56 O2 Sat:      96 % on Room air Temp:     97.7 degrees F oral Pulse rate:   76 / minute BP sitting:   124 / 72  (right arm) Cuff size:   regular  Vitals  Entered By: Carron Curie CMA (September 12, 2010 4:56 PM)  O2 Flow:  Room air  Serial Vital Signs/Assessments:  Comments: Ambulatory Pulse Oximetry  Resting; HR_79____    02 Sat__95%ra___  Lap1 (185 feet)   HR_95____   02 Sat_97%ra____ Lap2 (185 feet)   HR__96___   02 Sat_95%ra____    Lap3 (185 feet)   HR_94____   02 Sat__98%ra___  _X__Test Completed without Difficulty ___Test Stopped due to: Carver Fila  September 12, 2010 5:33 PM    By: Carver Fila   CC: Pt her for follow-up after CT. pt denies any complaints at this time.  Comments Medications reviewed with patient Carron Curie CMA  September 12, 2010 4:57 PM Daytime phone number verified with patient.    Physical Exam  General:  well developed, well nourished, in no acute distress Head:  normocephalic and atraumatic Eyes:  PERRLA/EOM intact; conjunctiva and sclera clear Ears:  TMs intact and clear with normal canals Nose:  no deformity, discharge, inflammation, or lesions Mouth:  no deformity or lesions Neck:  no masses, thyromegaly, or abnormal cervical nodes Chest Wall:  no deformities noted Lungs:  decreased BS bilateral and prolonged exhilation.   Heart:  regular rate and rhythm, S1, S2 without murmurs, rubs, gallops, or clicks Abdomen:  bowel sounds positive; abdomen soft and non-tender without masses, or organomegaly Msk:  no deformity or scoliosis noted with normal posture Pulses:  pulses normal Extremities:  no clubbing, cyanosis, edema, or deformity noted Neurologic:  CN II-XII grossly intact with normal reflexes, coordination, muscle strength and tone Skin:  intact without lesions or rashes Cervical Nodes:  no significant adenopathy Axillary Nodes:  no significant adenopathy Psych:  alert and cooperative; normal mood and affect; normal attention span and concentration   CT of Chest  Procedure date:  09/10/2010  Findings:      CT 09/10/2010 shows LUL cancer in remission and RUL nodule as  stable when compared to july 2011  Impression & Recommendations:  Problem # 1:  PULMONARY NODULE, RIGHT UPPER LOBE (ICD-518.89) Assessment Unchanged  Orders: Radiology Referral (Radiology) Est. Patient Level IV (04540)  This appeared new on surveillance CT during Jan 2011. Nearly resolved July 2011. Uncanbged on CT chest Jan 2012  plan repeat ct july 2012  - needs surveillance till Jan 2013  Problem # 2:  CARCINOMA, LUNG, UPPER LOBE (ICD-162.3) Assessment: Improved  s/p LEft Upper Lobe Lobectomy June 2010 for Stage 1B (T2A, N0, M0, 3.1cm) Squamous cell carcinoma of the lung without folllowup Chemo/XRT  per Dr. Shirline Frees. She and family wish to follow with me for  post lobectomy cancer surveillance.  1 year surveillance CT on 02/22/2010 shows no evidence of recurrence. No recurrence on CT at 18 months Jan 2012  plan next CT July 2012 ( 2year needs annual CT chest for life every summer (I told her this agin. She and husband were surprised but they verbalized understanding)  Problem # 3:  COPD (ICD-496) Assessment: Unchanged stable disease  plan continu spiriva and symbicort check alpha 1  Other Orders: Misc. Referral (Misc. Ref)  Patient  Instructions: 1)  #COPD 2)  glad you are doing well 3)  nuse will walk you for oxygen levels 4)  we will check your alpha 1 gene test for copd 5)  #Lung cancer 6)   - ct shows it is under control 7)   - next CT chest july 2012 8)  #RUL nodules 9)   - stable since July 2011 10)   - next ct chest july 2012 11)  #FOLLOWUP 12)   - 9 months or sooner ifneeded

## 2010-10-01 ENCOUNTER — Telehealth: Payer: Self-pay | Admitting: Internal Medicine

## 2010-10-04 ENCOUNTER — Telehealth: Payer: Self-pay | Admitting: Internal Medicine

## 2010-10-10 NOTE — Progress Notes (Signed)
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 This is normal and no issue for her. However, the S gene might indicate need for family testing  PLs give heads up and I will call when I am off night float.s if  Initial call taken by: Kalman Shan MD,  October 01, 2010 2:19 AM  Follow-up for Phone Call        Spoke with pt and advised of results and that MR will be calling her in the near future. Carron Curie CMA  October 02, 2010 5:13 PM

## 2010-10-10 NOTE — Progress Notes (Signed)
Summary: returning a call from Dr Marchelle Gearing  Phone Note Call from Patient   Caller: Patient Call For: [facility] Summary of Call: Patient phoned stated that she was returning a call from Dr Marchelle Gearing earlier today. She is at home now and can be reached at [phone] she will be there until 6 or 6:30 this evening. Initial call taken by: Vedia Coffer,  October 04, 2010 2:45 PM  Follow-up for Phone Call        MR, did you try to call this pt? Gweneth Dimitri RN  October 04, 2010 2:51 PM   Additional Follow-up for Phone Call Additional follow up Details #1::        spoke to patient. advised due to S gene, best to get her half brother (age 33, smokes) and kids tested. SHe verbalized understanding Additional Follow-up by: Kalman Shan MD,  October 05, 2010 6:12 PM

## 2010-11-02 IMAGING — CT CT CHEST W/ CM
2 of 4 series · 15 of 36 positions shown, 18 images · IV contrast (agent unspecified)
Comparison: Chest x-ray of 12/23/2008

CLINICAL DATA: Abnormal chest x-ray, nodule in the left lung apex,
smoking history, productive cough

CT CHEST WITH CONTRAST
TECHNIQUE: Multidetector CT imaging of the chest was performed
following the standard protocol during bolus administration of
intravenous contrast.
Contrast: 100 ml [id number]

[Series 2: routine chest 5.0 st · axial · 0.60mm/px · z∈[-350,-76]mm · 12 of 65 slices shown, 15 images]
[im 5/65  mediastinal]
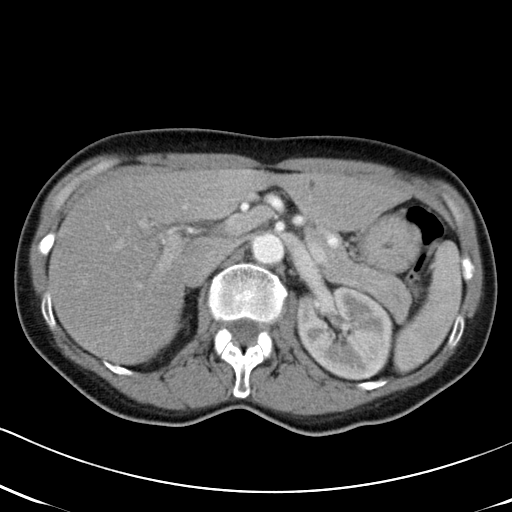
[im 5/65  lung]
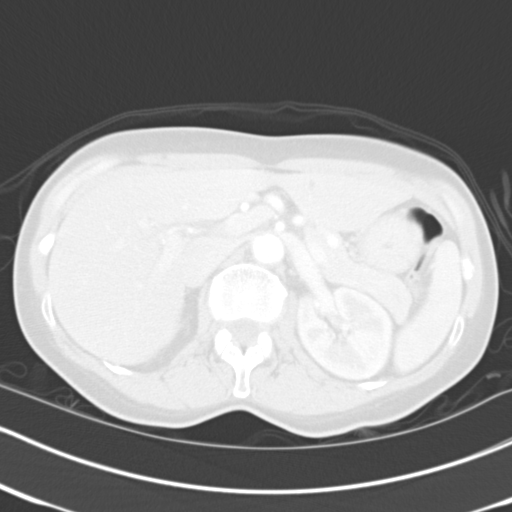
[im 10/65  lung]
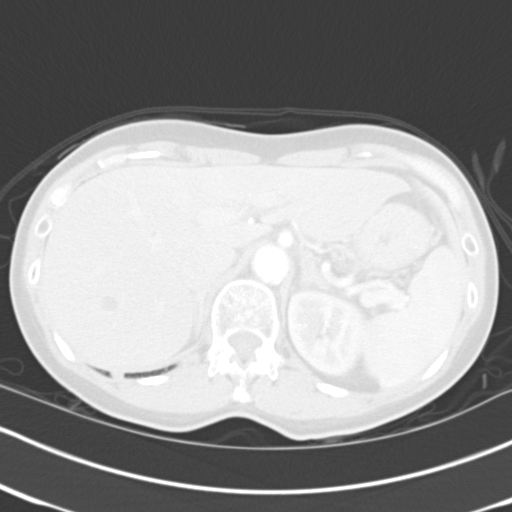
[im 14/65  lung]
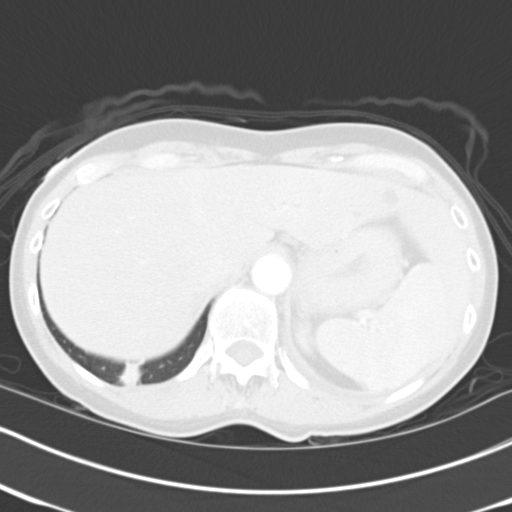
[im 19/65  lung]
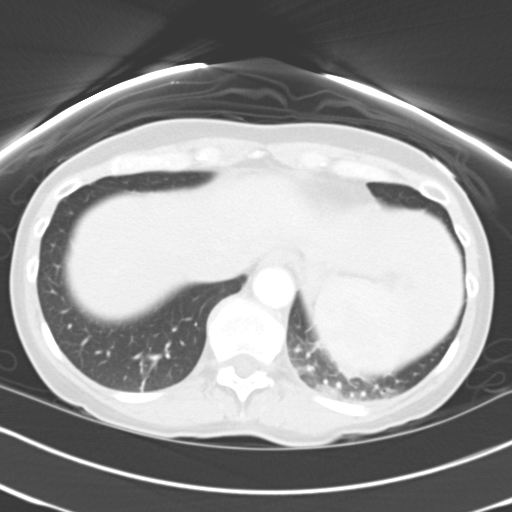
[im 23/65  mediastinal]
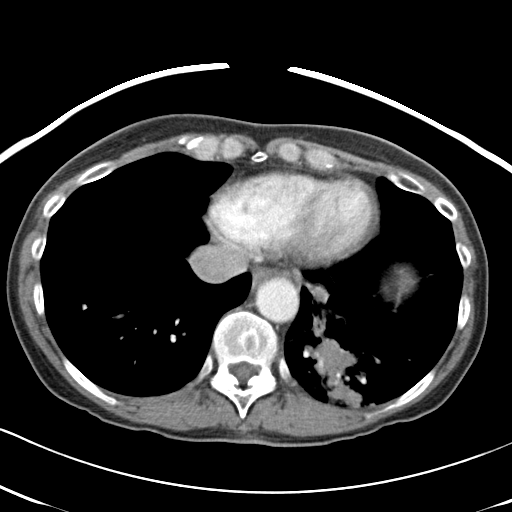
[im 23/65  lung]
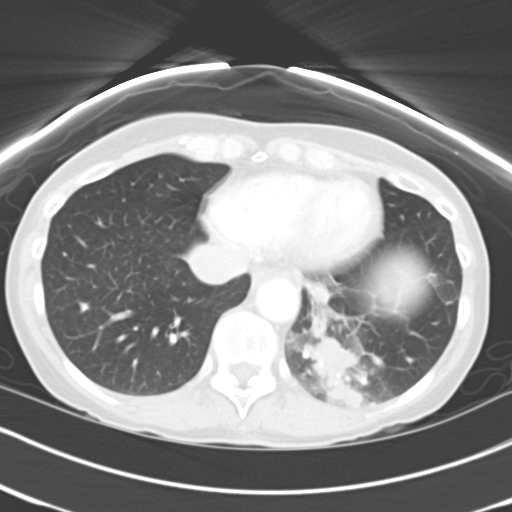
[im 28/65  lung]
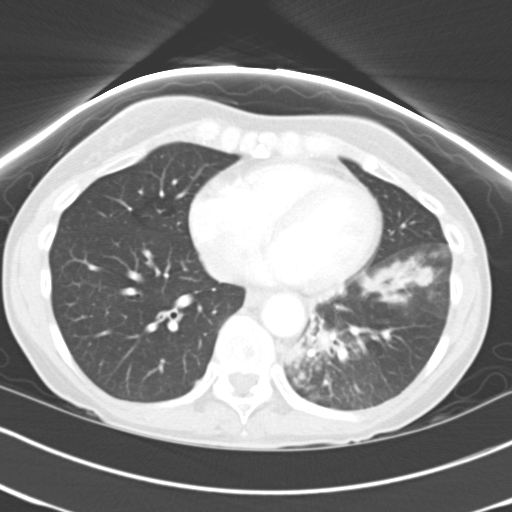
[im 37/65  lung]
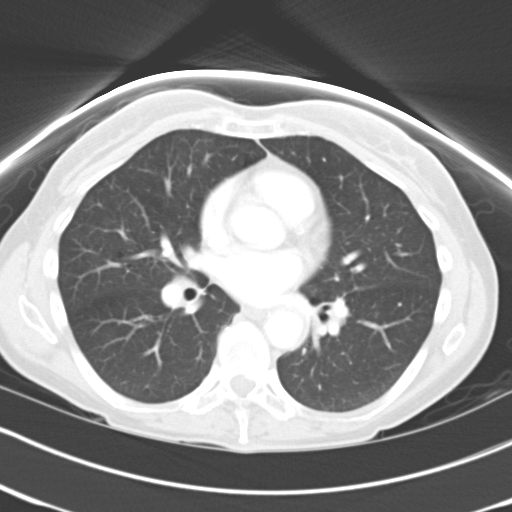
[im 42/65  lung]
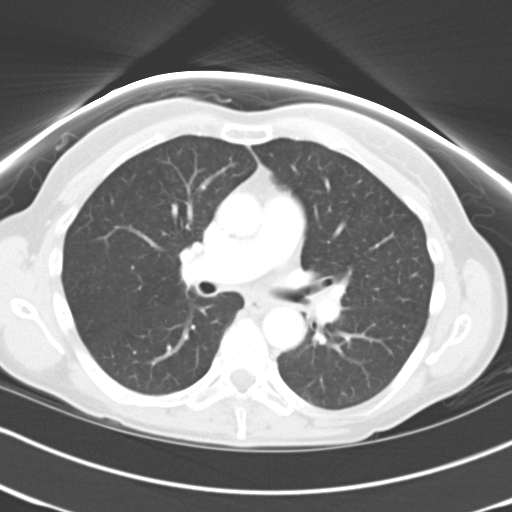
[im 46/65  mediastinal]
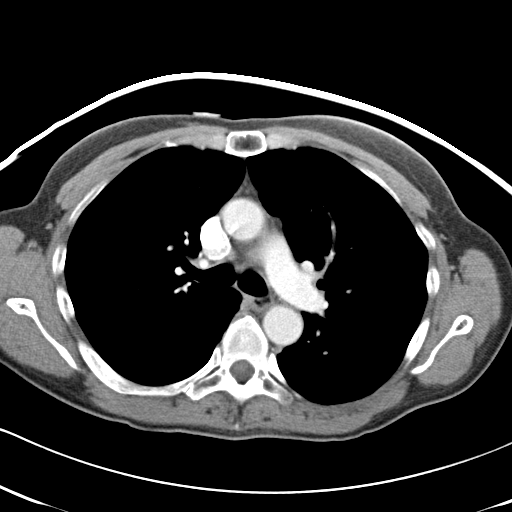
[im 46/65  lung]
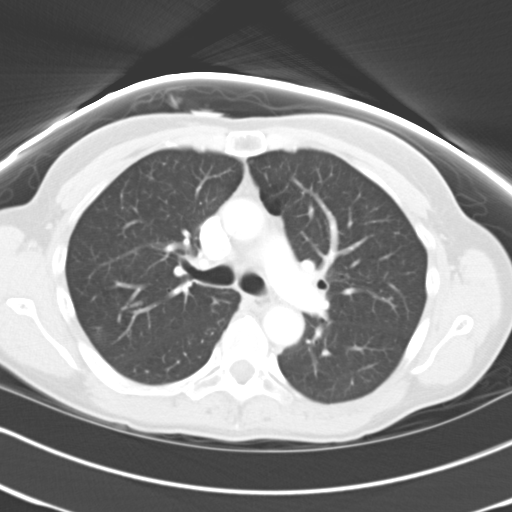
[im 51/65  lung]
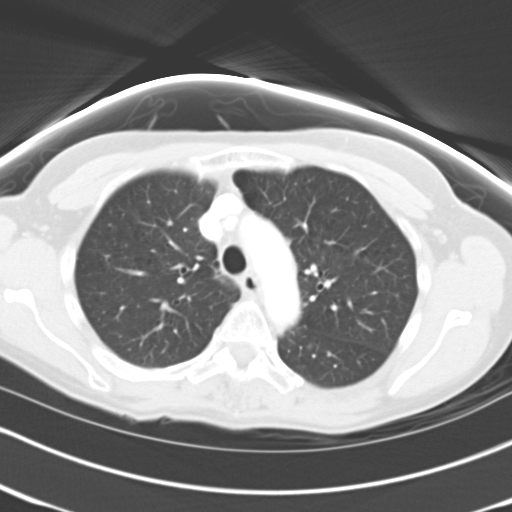
[im 55/65  lung]
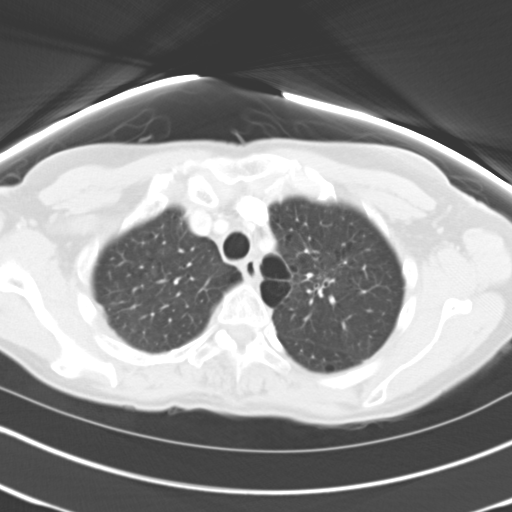
[im 60/65  lung]
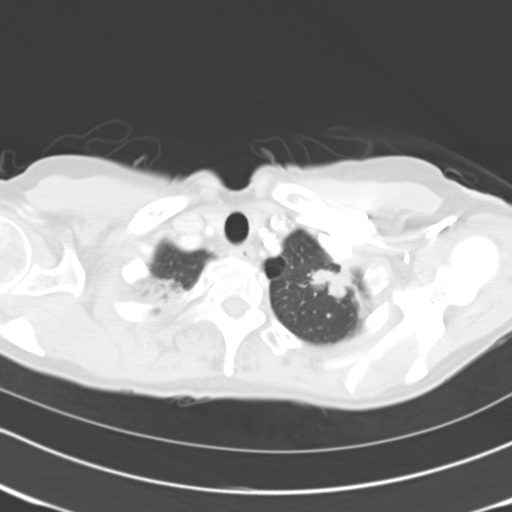

[Series 602: coronal chest · coronal · 0.60mm/px · 3 of 62 slices shown]
[im 13/62  lung]
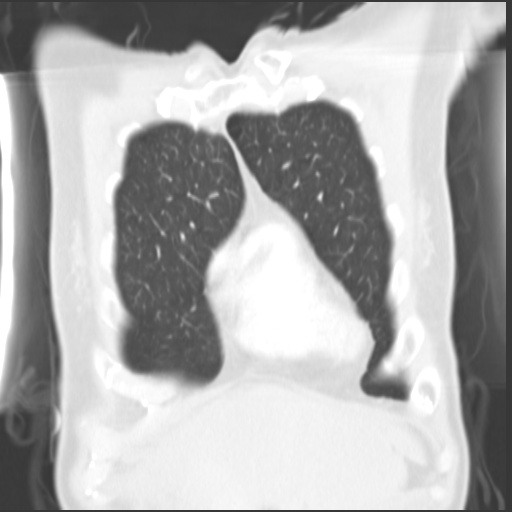
[im 25/62  lung]
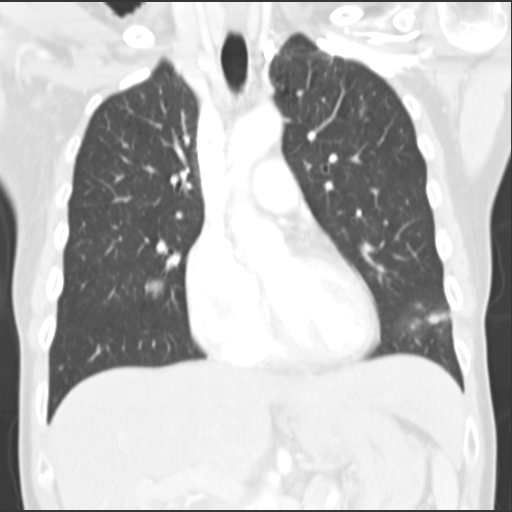
[im 37/62  lung]
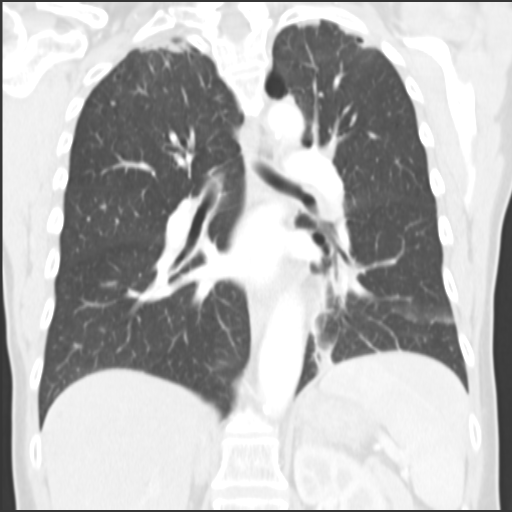

[15 of 36 positions shown; findings below may reference images not displayed]

FINDINGS: The nodular lesion noted on chest x-ray in the left apex
is very lobular with a maximum axial diameter of 22 x 31 mm.  A
small ground-glass opacity is noted more inferiorly in the left
upper lobe on image number 14 as well.  The opacity noted at the
left lung base by chest x-ray is very nodular and consolidated in
appearance.  Although this could represent a focus of pneumonia,
metastatic involvement of the left lower lobe is also a
consideration. The largest consolidated nodular lesion in the
posterior medial left lower lobe on image number 42 measures 27 x
26 mm. A nodular opacity is noted deep in the right posterior lung
sulcus worrisome for contralateral metastatic lesion as well
measuring 15 x 11 mm.  Ground-glass opacity is noted in the right
middle lobe also worrisome for metastatic lesion.  No pleural
effusion is seen. Another consideration is that of atypical
pneumonia, possibly fungal.  PET-CT may be helpful to evaluate
metabolic activity within these lesions and to aid in evaluating
possible lesion for biopsy.

On mediastinal window images the thoracic aorta and pulmonary
arteries opacify with no significant abnormality noted.  No
mediastinal or hilar adenopathy is seen.  Well defined liver
lesions most likely represent cysts, the largest in the left lobe.
IMPRESSION: Lobular lesion in the left lung apex, with lobular opacities in
the left lower lobe and ground-glass opacities as described above.
These findings most like represent lung carcinoma with metastases
although atypical infection such as fungal process cannot be
excluded.  Consider PET CT to assess further and as a guide for
possible biopsy.

## 2010-11-15 ENCOUNTER — Ambulatory Visit: Payer: Self-pay | Admitting: Cardiothoracic Surgery

## 2010-11-15 ENCOUNTER — Ambulatory Visit (INDEPENDENT_AMBULATORY_CARE_PROVIDER_SITE_OTHER): Payer: BC Managed Care – PPO | Admitting: Cardiothoracic Surgery

## 2010-11-15 DIAGNOSIS — C3490 Malignant neoplasm of unspecified part of unspecified bronchus or lung: Secondary | ICD-10-CM

## 2010-11-15 NOTE — Assessment & Plan Note (Signed)
OFFICE VISIT  [name], [name] DOB:  18-Mar-1951                                        November 15, 2010 CHART #:  [id number]  The patient returns to the office today in followup after her left video- assisted thoracotomy, left video-assisted thoracoscopy, mini thoracotomy, and left upper lobectomy.  What was staged as a pT2a, pN0, pMx squamous cell carcinoma of the lung 3.1 cm in its greatest dimension.  She has been followed since that time with serial CT scans. She had one done on September 10, 2010 at Dr. Jane Canary Office.  Since last seen she has continues to do well, working full-time without any complaints of shortness of breath or hemoptysis.  On exam, her blood pressure is 111/73, pulse is 90, respiratory rate 16, O2 sats 98% on room air.  I do not appreciate any cervical, supraclavicular, or axillary adenopathy.  Her lungs are clear bilaterally.  The left chest incision is well healed without any wound breakdown or masses palpable.  She has no lower extremity edema or tenderness.  Followup CT scan is reviewed and it appears unchanged.  There is still ground-glass nodule on the right upper lobe unchanged from prior exams. She originally presented with significant inflammatory process in the lungs.  She notes that she has a followup appointment with Dr. Marchelle Gearing, with followup CT scan in 6 months.  I will see her in a month or two following this and review that CT scan at the same time and not order any additional one.  The patient also was unclear, notes that she did not have a flu vaccination this year in the fall of 2011 and is unsure about her pneumococcal vaccination status.  And we will discuss this with Dr. Marchelle Gearing when she sees him.  Sheliah Plane, MD Electronically Signed  EG/MEDQ  D:  11/15/2010  T:  11/15/2010  Job:  [id number]  cc:   Kalman Shan, MD Valetta Mole. Swords, MD

## 2010-11-26 LAB — URINALYSIS, ROUTINE W REFLEX MICROSCOPIC
Bilirubin Urine: NEGATIVE
Glucose, UA: NEGATIVE mg/dL
Hgb urine dipstick: NEGATIVE
Ketones, ur: NEGATIVE mg/dL
Nitrite: NEGATIVE
Protein, ur: NEGATIVE mg/dL
Specific Gravity, Urine: 1.013 (ref 1.005–1.030)
Urobilinogen, UA: 0.2 mg/dL (ref 0.0–1.0)
pH: 5 (ref 5.0–8.0)

## 2010-11-26 LAB — BLOOD GAS, ARTERIAL
Acid-Base Excess: 1.5 mmol/L (ref 0.0–2.0)
Acid-base deficit: 0.1 mmol/L (ref 0.0–2.0)
Bicarbonate: 23.8 mEq/L (ref 20.0–24.0)
Bicarbonate: 26.1 mEq/L — ABNORMAL HIGH (ref 20.0–24.0)
Drawn by: 290241
FIO2: 0.21 %
FIO2: 0.21 %
O2 Saturation: 90.3 %
O2 Saturation: 98.6 %
Patient temperature: 100.1
Patient temperature: 98.6
TCO2: 25 mmol/L (ref 0–100)
TCO2: 27.5 mmol/L (ref 0–100)
pCO2 arterial: 37.7 mmHg (ref 35.0–45.0)
pCO2 arterial: 47.1 mmHg — ABNORMAL HIGH (ref 35.0–45.0)
pH, Arterial: 7.367 (ref 7.350–7.400)
pH, Arterial: 7.417 — ABNORMAL HIGH (ref 7.350–7.400)
pO2, Arterial: 108 mmHg — ABNORMAL HIGH (ref 80.0–100.0)
pO2, Arterial: 60.9 mmHg — ABNORMAL LOW (ref 80.0–100.0)

## 2010-11-26 LAB — COMPREHENSIVE METABOLIC PANEL
ALT: 16 U/L (ref 0–35)
ALT: 9 U/L (ref 0–35)
AST: 20 U/L (ref 0–37)
AST: 15 U/L (ref 0–37)
Albumin: 2.8 g/dL — ABNORMAL LOW (ref 3.5–5.2)
Albumin: 4 g/dL (ref 3.5–5.2)
Alkaline Phosphatase: 61 U/L (ref 39–117)
Alkaline Phosphatase: 86 U/L (ref 39–117)
BUN: 1 mg/dL — ABNORMAL LOW (ref 6–23)
BUN: 13 mg/dL (ref 6–23)
CO2: 30 mEq/L (ref 19–32)
CO2: 22 mEq/L (ref 19–32)
Calcium: 8.6 mg/dL (ref 8.4–10.5)
Calcium: 9.4 mg/dL (ref 8.4–10.5)
Chloride: 106 mEq/L (ref 96–112)
Chloride: 107 mEq/L (ref 96–112)
Creatinine, Ser: 0.54 mg/dL (ref 0.4–1.2)
Creatinine, Ser: 0.62 mg/dL (ref 0.4–1.2)
GFR calc Af Amer: >60 mL/min (ref >60)
GFR calc Af Amer: >60 mL/min (ref >60)
GFR calc non Af Amer: >60 mL/min (ref >60)
GFR calc non Af Amer: >60 mL/min (ref >60)
Glucose, Bld: 127 mg/dL — ABNORMAL HIGH (ref 70–99)
Glucose, Bld: 96 mg/dL (ref 70–99)
Potassium: 3.4 mEq/L — ABNORMAL LOW (ref 3.5–5.1)
Potassium: 4.4 mEq/L (ref 3.5–5.1)
Sodium: 142 mEq/L (ref 135–145)
Sodium: 137 mEq/L (ref 135–145)
Total Bilirubin: 0.6 mg/dL (ref 0.3–1.2)
Total Bilirubin: 0.4 mg/dL (ref 0.3–1.2)
Total Protein: 5.8 g/dL — ABNORMAL LOW (ref 6.0–8.3)
Total Protein: 6.7 g/dL (ref 6.0–8.3)

## 2010-11-26 LAB — BASIC METABOLIC PANEL
BUN: 2 mg/dL — ABNORMAL LOW (ref 6–23)
BUN: 3 mg/dL — ABNORMAL LOW (ref 6–23)
CO2: 30 mEq/L (ref 19–32)
CO2: 27 mEq/L (ref 19–32)
Calcium: 8.8 mg/dL (ref 8.4–10.5)
Calcium: 8.1 mg/dL — ABNORMAL LOW (ref 8.4–10.5)
Chloride: 102 mEq/L (ref 96–112)
Chloride: 109 mEq/L (ref 96–112)
Creatinine, Ser: 0.59 mg/dL (ref 0.4–1.2)
Creatinine, Ser: 0.55 mg/dL (ref 0.4–1.2)
GFR calc Af Amer: >60 mL/min (ref >60)
GFR calc Af Amer: >60 mL/min (ref >60)
GFR calc non Af Amer: >60 mL/min (ref >60)
GFR calc non Af Amer: >60 mL/min (ref >60)
Glucose, Bld: 111 mg/dL — ABNORMAL HIGH (ref 70–99)
Glucose, Bld: 153 mg/dL — ABNORMAL HIGH (ref 70–99)
Potassium: 4.1 mEq/L (ref 3.5–5.1)
Potassium: 3.5 mEq/L (ref 3.5–5.1)
Sodium: 139 mEq/L (ref 135–145)
Sodium: 139 mEq/L (ref 135–145)

## 2010-11-26 LAB — CBC
HCT: 34.9 % — ABNORMAL LOW (ref 36.0–46.0)
HCT: 32.9 % — ABNORMAL LOW (ref 36.0–46.0)
HCT: 40.8 % (ref 36.0–46.0)
Hemoglobin: 11.9 g/dL — ABNORMAL LOW (ref 12.0–15.0)
Hemoglobin: 11.4 g/dL — ABNORMAL LOW (ref 12.0–15.0)
Hemoglobin: 14 g/dL (ref 12.0–15.0)
MCHC: 34.2 g/dL (ref 30.0–36.0)
MCHC: 34.3 g/dL (ref 30.0–36.0)
MCHC: 34.7 g/dL (ref 30.0–36.0)
MCV: 87.1 fL (ref 78.0–100.0)
MCV: 84.9 fL (ref 78.0–100.0)
MCV: 85.5 fL (ref 78.0–100.0)
Platelets: 196 10*3/uL (ref 150–400)
Platelets: 199 10*3/uL (ref 150–400)
Platelets: 244 10*3/uL (ref 150–400)
RBC: 4 MIL/uL (ref 3.87–5.11)
RBC: 3.85 MIL/uL — ABNORMAL LOW (ref 3.87–5.11)
RBC: 4.8 MIL/uL (ref 3.87–5.11)
RDW: 13.4 % (ref 11.5–15.5)
RDW: 13 % (ref 11.5–15.5)
RDW: 13.6 % (ref 11.5–15.5)
WBC: 7.1 10*3/uL (ref 4.0–10.5)
WBC: 7.5 10*3/uL (ref 4.0–10.5)
WBC: 8 10*3/uL (ref 4.0–10.5)

## 2010-11-26 LAB — TYPE AND SCREEN
ABO/RH(D): O POS
Antibody Screen: NEGATIVE

## 2010-11-26 LAB — PROTIME-INR
INR: 0.9 (ref 0.00–1.49)
Prothrombin Time: 12.7 seconds (ref 11.6–15.2)

## 2010-11-26 LAB — ABO/RH: ABO/RH(D): O POS

## 2010-11-26 LAB — APTT: aPTT: 30 seconds (ref 24–37)

## 2010-11-27 LAB — PROTIME-INR: Prothrombin Time: 13 seconds (ref 11.6–15.2)

## 2010-11-27 LAB — DIFFERENTIAL
Basophils Absolute: 0 10*3/uL (ref 0.0–0.1)
Basophils Relative: 0 % (ref 0–1)
Basophils Relative: 0 % (ref 0–1)
Eosinophils Absolute: 0.1 10*3/uL (ref 0.0–0.7)
Eosinophils Relative: 2 % (ref 0–5)
Lymphocytes Relative: 9 % — ABNORMAL LOW (ref 12–46)
Lymphs Abs: 1.3 10*3/uL (ref 0.7–4.0)
Neutro Abs: 12.9 10*3/uL — ABNORMAL HIGH (ref 1.7–7.7)
Neutrophils Relative %: 74 % (ref 43–77)
Neutrophils Relative %: 87 % — ABNORMAL HIGH (ref 43–77)

## 2010-11-27 LAB — CBC
HCT: 36.3 % (ref 36.0–46.0)
Hemoglobin: 13.1 g/dL (ref 12.0–15.0)
MCHC: 35.2 g/dL (ref 30.0–36.0)
MCHC: 36 g/dL (ref 30.0–36.0)
MCV: 86 fL (ref 78.0–100.0)
MCV: 86.8 fL (ref 78.0–100.0)
Platelets: 233 10*3/uL (ref 150–400)
Platelets: 278 10*3/uL (ref 150–400)
Platelets: ADEQUATE 10*3/uL (ref 150–400)
RBC: 4.21 MIL/uL (ref 3.87–5.11)
RDW: 13.1 % (ref 11.5–15.5)
RDW: 13.3 % (ref 11.5–15.5)
WBC: 6 10*3/uL (ref 4.0–10.5)

## 2010-11-27 LAB — COMPREHENSIVE METABOLIC PANEL
ALT: 9 U/L (ref 0–35)
AST: 13 U/L (ref 0–37)
AST: 16 U/L (ref 0–37)
Albumin: 2.9 g/dL — ABNORMAL LOW (ref 3.5–5.2)
Albumin: 3 g/dL — ABNORMAL LOW (ref 3.5–5.2)
BUN: 6 mg/dL (ref 6–23)
Calcium: 8.4 mg/dL (ref 8.4–10.5)
Chloride: 110 mEq/L (ref 96–112)
Creatinine, Ser: 0.63 mg/dL (ref 0.4–1.2)
Creatinine, Ser: 0.65 mg/dL (ref 0.4–1.2)
GFR calc Af Amer: >60 mL/min (ref >60)
GFR calc Af Amer: >60 mL/min (ref >60)
GFR calc non Af Amer: >60 mL/min (ref >60)
Potassium: 4 mEq/L (ref 3.5–5.1)
Sodium: 141 mEq/L (ref 135–145)
Total Bilirubin: 0.9 mg/dL (ref 0.3–1.2)
Total Protein: 5.8 g/dL — ABNORMAL LOW (ref 6.0–8.3)
Total Protein: 5.9 g/dL — ABNORMAL LOW (ref 6.0–8.3)

## 2010-11-27 LAB — URINALYSIS, ROUTINE W REFLEX MICROSCOPIC
Bilirubin Urine: NEGATIVE
Glucose, UA: NEGATIVE mg/dL
Hgb urine dipstick: NEGATIVE
Protein, ur: NEGATIVE mg/dL
Urobilinogen, UA: 1 mg/dL (ref 0.0–1.0)

## 2010-11-27 LAB — STREP PNEUMONIAE URINARY ANTIGEN: Strep Pneumo Urinary Antigen: NEGATIVE

## 2010-11-27 LAB — GLUCOSE, CAPILLARY: Glucose-Capillary: 85 mg/dL (ref 70–99)

## 2010-11-27 LAB — CULTURE, RESPIRATORY W GRAM STAIN: Culture: NORMAL

## 2010-11-27 LAB — BASIC METABOLIC PANEL
BUN: 7 mg/dL (ref 6–23)
Calcium: 8.9 mg/dL (ref 8.4–10.5)
Creatinine, Ser: 0.67 mg/dL (ref 0.4–1.2)
GFR calc non Af Amer: >60 mL/min (ref >60)
Glucose, Bld: 132 mg/dL — ABNORMAL HIGH (ref 70–99)
Sodium: 136 mEq/L (ref 135–145)

## 2010-11-27 LAB — EXPECTORATED SPUTUM ASSESSMENT W GRAM STAIN, RFLX TO RESP C

## 2010-11-27 LAB — LEGIONELLA ANTIGEN, URINE

## 2010-11-27 LAB — URINE CULTURE
Colony Count: NO GROWTH
Culture: NO GROWTH
Special Requests: NEGATIVE

## 2010-11-27 LAB — CULTURE, BLOOD (ROUTINE X 2)
Culture: NO GROWTH
Culture: NO GROWTH

## 2011-01-01 ENCOUNTER — Emergency Department (HOSPITAL_COMMUNITY): Payer: BC Managed Care – PPO

## 2011-01-01 ENCOUNTER — Emergency Department (HOSPITAL_COMMUNITY)
Admission: EM | Admit: 2011-01-01 | Discharge: 2011-01-01 | Disposition: A | Discharge status: Home / Self Care | Payer: BC Managed Care – PPO | Attending: Emergency Medicine | Admitting: Emergency Medicine

## 2011-01-01 DIAGNOSIS — IMO0002 Reserved for concepts with insufficient information to code with codable children: Secondary | ICD-10-CM | POA: Insufficient documentation

## 2011-01-01 DIAGNOSIS — W19XXXA Unspecified fall, initial encounter: Secondary | ICD-10-CM | POA: Insufficient documentation

## 2011-01-01 DIAGNOSIS — Z85118 Personal history of other malignant neoplasm of bronchus and lung: Secondary | ICD-10-CM | POA: Insufficient documentation

## 2011-01-01 DIAGNOSIS — Y998 Other external cause status: Secondary | ICD-10-CM | POA: Insufficient documentation

## 2011-01-01 DIAGNOSIS — Y92009 Unspecified place in unspecified non-institutional (private) residence as the place of occurrence of the external cause: Secondary | ICD-10-CM | POA: Insufficient documentation

## 2011-01-01 NOTE — Discharge Summary (Signed)
NAMEIONNA, AVIS               ACCOUNT NO.:  [id number]   MEDICAL RECORD NO.:  [id number]          PATIENT TYPE:  INP   LOCATION:  3308                         FACILITY:  [facility]   PHYSICIAN:  Sheliah Plane, MD    DATE OF BIRTH:  06-Jun-1951   DATE OF ADMISSION:  01/30/2009  DATE OF DISCHARGE:                               DISCHARGE SUMMARY   FINAL DIAGNOSIS:  Left upper lobe lung disease, positive for squamous  cell carcinoma T2a, N0, MX.   SECONDARY DIAGNOSES:  1. Status post cholecystectomy.  2. Status post hysterectomy.   IN-HOSPITAL OPERATIONS AND PROCEDURES:  1. Bronchoscopy.  2. Left video-assisted thoracoscopic surgery with mini thoracotomy,      left upper lobectomy, and lymph node dissection.   HISTORY AND PHYSICAL AND HOSPITAL COURSE:  The patient is a 60 year old  female with a long history of tobacco use who presented to [facility] in  May with pneumonia symptoms.  Chest x-ray done showed a left upper lobe  lung mass.  Further studies including a CT scan of chest and PET scan  showed extensive air leak in the left upper lobe, small lesion that is 1  cm in the left upper lobe, right upper lobe lung mass, right lower lobe  lung mass, and extensive mass in the left lower lobe.  The left upper  lobe lung mass, left lower lung mass, and right lower lung mass were all  hypermetabolic on PET scan.  The right upper lobe lesion indeterminate  and lesion in the left upper lobe was hyperchromic, not hypermetabolic.  Initially, a needle biopsy was done on the left upper lobe lesion.  This  showed poorly differentiated non-small-cell carcinoma.  Of note,  __________ was thought to be more compatible __________ carcinoma.  An  MRI scan of the brain was performed that showed no evidence of  metastatic disease.  The patient was treated for pneumonia and presumed  to have advanced stage lung cancer.  The patient quit smoking for the  past month.  A followup CT scan done showed  complete resolution of left  lower lobe lung lesion and right lower lobe lung lesion, leaving the  known diagnostic lung cancer in the left upper lobe and scarring of the  right apex that was not hypermetabolic.  The patient was seen and  evaluated by Dr. Tyrone Sage.  Dr. Tyrone Sage discussed based on her  findings, left upper lobectomy.  He discussed risks and benefits with  the patient.  The patient acknowledged understanding and agreed to  proceed.  Surgery was scheduled for January 30, 2009.  For details of the  patient's past medical history and physical exam, please see dictated  H&P.   The patient was taken to the operating room on January 30, 2009, where she  underwent bronchoscopy with left video-assisted thoracoscopic surgery,  mini thoracotomy, and left upper lobectomy with lymph node dissection.  The patient tolerated this procedure well and transferred to the  Intensive Care Unit in stable condition.  The patient's pathology report  came back showing squamous cell carcinoma T2a, N0, MX.  Postoperatively,  the patient was able to be extubated following surgery.  Post  extubation, she was noted to be alert and oriented x4, neuro intact.  The patient was noted to be hemodynamically stable postoperatively.  Daily chest x-rays obtained postoperatively.  She was noted to have a  small apical pneumothorax initially.  There was also a small air leak  noted.  Suction remained.  This was monitored and that small  pneumothorax resolved as well as air leak.  Chest tubes were placed on  waterseal.  Anterior chest tube was discontinued on postop day #3 with  remaining chest tube discontinued on postop day #4.  Followup chest x-  ray showed a small persistent left apical pneumothorax.  During this  time, the patient was encouraged to use her incentive spirometer.  She  was able to be weaned off oxygen saturating greater than 90% on room  air.  Vital signs were followed closely.  The patient remained  afebrile.  She remained in normal sinus rhythm.  Blood pressure stable.  The  patient was up ambulating well with assistance.  She was tolerating diet  well and no nausea or vomiting noted.  All incisions were clean, dry,  and intact and healing well.   The patient is tentatively ready for discharge home in the next 24-48  hours pending she remained stable.  We will obtain PA and lateral chest  x-ray prior to discharge to home.  Most recent lab work showed sodium of  139, potassium of 4.1, chloride of 102, bicarbonate 30, BUN of 2,  creatinine 0.59, and glucose of 111.  White blood cell count of 7.1,  hemoglobin of 11.9, hematocrit 34.9, and platelet count 196.   FOLLOWUP APPOINTMENTS:  A followup appointment is arranged with Dr.  Tyrone Sage for February 23, 2009, at 9:30 a.m.  The patient needs to obtain PA  and lateral chest x-ray 30 minutes prior to this appointment.  An  appointment with a nurse for suture removal has been made for February 10, 2009, at 10 a.m.   ACTIVITY:  The patient is instructed no driving until released to do so,  no heavy lifting over 10 pounds.  She is told to ambulate 3-4 times per  day, progress as tolerated and continue her breathing exercises.   INCISIONAL CARE:  The patient is told to shower washing her incisions  using soap and water.  She is to contact the office if she develops any  drainage or opening from any of her incision sites.   DIET:  The patient is educated on diet to be low fat and low salt.   DISCHARGE MEDICATIONS:  1. Chantix b.i.d.  2. Tylenol p.r.n.  3. Oxycodone 5 mg 1-2 tabs q.4-6 hours p.r.n. pain.      Sol Blazing, PA      Sheliah Plane, MD  Electronically Signed    KMD/MEDQ  D:  02/03/2009  T:  02/04/2009  Job:  [id number]   cc:   Lajuana Matte, MD  Valetta Mole. Swords, MD

## 2011-01-01 NOTE — H&P (Signed)
[name], [name]               ACCOUNT NO.:  [id number]   MEDICAL RECORD NO.:  [id number]          PATIENT TYPE:  INP   LOCATION:  3311                         FACILITY:  [facility]   PHYSICIAN:  Donalynn Furlong, MD      DATE OF BIRTH:  12/27/1950   DATE OF ADMISSION:  12/24/2008  DATE OF DISCHARGE:                              HISTORY & PHYSICAL   PRIMARY CARE PHYSICIAN:  Dr. Birdie Sons with [facility].   CHIEF COMPLAINT:  Fever, productive cough, shortness of breath.   HISTORY OF PRESENT ILLNESS:  [name] is a 60 year old Caucasian  female who lives in [place], [place].  She presented to [facility] ER last night with a complaint of one-day history of fever, chills,  productive cough and shortness of breath.  She mentions that she had a  similar episode about a month ago, and at that time, she was prescribed  antibiotics, and the name of the antibiotic is not known.  It started to  get better, but it did not clear completely.  She started having  symptoms today.  She admits to a history of smoking at that time.  She  does have a history of pneumonia in the past.  She denied any history of  coronary artery disease, pulmonary embolism in the past.   PAST MEDICAL HISTORY:  1. Positive for cholecystectomy.  2. Hysterectomy.  3. History of tobacco use.   ALLERGIES:  PENICILLIN, CODEINE.  Codeine causes nausea.  Penicillin  causes hives.  The patient mentions that she has tolerated amoxicillin  in the past.   HOME MEDICATIONS:  Tylenol.   REVIEW OF SYSTEMS:  Positive for HPI.  Otherwise negative for review of  systems done for 14 systems.   FAMILY HISTORY:  Nothing remarkable.   SOCIAL HISTORY:  [place]  Recent user of tobacco.  No alcohol or illicit drug use.   PHYSICAL EXAMINATION:  VITAL SIGNS:  Blood pressure 110/71, pulse 113,  respirations 22, temperature 101.4 at the time of presentation in the  ER.  Subsequently, her  vitals have been stabilized.  GENERAL EXAM:  Alert and oriented x3 lying in bed without any acute  distress.  CARDIOVASCULAR:  S1-S2 regular.  No murmurs, rubs, or gallops.  LUNGS:  Left-sided basal crackles.  Otherwise clear to auscultation  bilaterally anteriorly.  ABDOMEN:  Nontender, nondistended.  Bowel sounds present.  EXTREMITIES:  No clubbing, cyanosis, or edema.  Pulses were normal in  all 4 extremities.  HEAD:  Normocephalic, atraumatic.  EYES:  Pupils reactive to light and accommodation.  Extraocular muscles  intact.  ORAL CAVITY:  Oral mucosa moist.  No thrush.  NECK:  No thyromegaly or JVD.  SKIN:  No rashes or bruits.   LABORATORY DATA:  CBC with differential with leukocytosis of 14,000.  Basic metabolic panel shows elevated glucose, otherwise normal.  Chest x-  ray shows patchy air space density in the left lower lung and the left  upper lung mass suggestive of cancer.   ASSESSMENT AND PLAN:  1. Left-sided lung pneumonia  complicated by left-sided lung mass.  2. Leukocytosis.  3. Cholecystectomy.  4. Hysterectomy.   PLAN:  We will admit patient on our service on telemetry bed with a  diagnosis of pneumonia.  We will check CBC, CMP, blood cultures x2,  urine cultures, sputum from gram stain and culture, urine Legionella  antigen, urine pneumococcal antigen, nasal swab at this time.  We will  provide breathing treatment with albuterol ipratropium q.6 h. and p.r.n.  as needed.  We will start regular diet.  We will provide oxygen by nasal  cannula to keep oxygen saturation more than 90.  Will check CT chest  with contrast for lung mass in the morning.  We will start vancomycin 15  mg/kg IV q.12 h. along with moxifloxacin 400 mg IV daily.  We will check  vancomycin trough level prior to full dose.  The patient has already  received moxifloxacin and vancomycin in the [facility] ER.  We will  provide Tylenol, Phenergan p.r.n. for fever and nausea and vomiting and  Ambien  p.r.n. for sleep also.  We will start Lovenox 40 mg  subcutaneously q.24 h. for DVT prophylaxis.  We will start Nexium 40 mg  p.o. for GI prophylaxis also.  Further plan according to workup pending.      Donalynn Furlong, MD  Electronically Signed     TVP/MEDQ  D:  12/24/2008  T:  12/24/2008  Job:  [id number]   cc:   Valetta Mole. Swords, MD

## 2011-01-01 NOTE — Assessment & Plan Note (Signed)
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  Preoperatively, she  had had several CTs that were suggestive of widespread disseminated  disease with areas in the left lower lobe and right lung.  However, over  a short period of time, these areas disappeared leaving a prominent  lesion that was resected in the left upper lobe.  She has done well  postoperatively, returned to normal activities.  She continues to stay  off cigarettes.  Has had no hemoptysis.  Does note that she had a recent  flu shot.  Does not recall having pneumococcal vaccination.   On exam, her blood pressure 127/79, pulse is 88, respiratory rate is 18,  and O2 sats 97%.  Her lungs are clear bilaterally.  I do not appreciate  any cervical, supraclavicular, or axillary adenopathy.  The left VATS  incisions are well healed.  She has no pedal edema.   CT scan of the chest was performed several days ago and shows no  evidence of recurrent disease in the left lung.  Area in the right lung  as there is still small residual smudge, but this is definitely  smaller and less prominent than on the previous scans.  However, because  of this we will continue with very close followup.  I have discussed the  findings with her and her husband, and we will repeat the CT scan of the  chest in 6 months.   Sheliah Plane, MD  Electronically Signed   EG/MEDQ  D:  08/31/2009  T:  09/01/2009  Job:  [id number]   cc:   Kalman Shan, MD  Valetta Mole. Swords, MD

## 2011-01-01 NOTE — Assessment & Plan Note (Signed)
OFFICE VISIT   [name], [name]  DOB:  1951-03-12                                        April 27, 2009  CHART #:  [id number]   The patient is now three and a half months postop from left upper  lobectomy for 3.1 cm squamous cell carcinoma T2 N0 lesion that was  resected.  Prior to surgery she had been a smoker but has now completely  stopped and not returned to smoking.  She seems to be making reasonable  progress now 3 months after surgery, though she has returned to work and  increased her activities but has noticed more fatigue and shortness of  breath, still has some left chest wall discomfort but this seems to be  improving.  She also notes episodic nonproductive cough.   PHYSICAL EXAMINATION:  Her blood pressure is 128/83, pulse 72,  respiratory rate 18, O2 sats 97% on exam.  I do not appreciate any  cervical or supraclavicular adenopathy.  Her left chest incisions are  all well healed.  Lungs are clear.  On exam today, I do not appreciate  any wheezing.  She has no tenderness in her calves.   CURRENT MEDICATIONS:  She had been on Chantix and has stopped this, and  is no longer on pain medication.  She is not currently on inhaler.  She  noted that Dr. Marchelle Gearing had mentioned inhaler use possibly in the  future.  I have given her prescription for albuterol inhaler 2 puffs q.6  h. p.r.n. with 3 refills and I plan to see her back in 3 months with a  CT scan 6 months after her original surgery.  We will also arrange  appointment for follow up with Pulmonology with Dr. Marchelle Gearing in the  coming weeks and see how the bronchodilator has helped or not   A chest x-ray today shows postoperative changes with no new pulmonary  lesions appreciated and clear lung fields.   Sheliah Plane, MD  Electronically Signed   EG/MEDQ  D:  04/27/2009  T:  04/28/2009  Job:  [id number]   cc:   Kalman Shan, MD  Lajuana Matte, MD

## 2011-01-01 NOTE — H&P (Signed)
[name], [name]               ACCOUNT NO.:  [id number]   MEDICAL RECORD NO.:  [id number]          PATIENT TYPE:  INP   LOCATION:  NA                           FACILITY:  [facility]   PHYSICIAN:  Sheliah Plane, MD    DATE OF BIRTH:  1951/05/01   DATE OF ADMISSION:  DATE OF DISCHARGE:                              HISTORY & PHYSICAL   REQUESTING PHYSICIAN:  Mohamed K. Arbutus Ped, MD   PRIMARY CARE PHYSICIAN:  Valetta Mole. Swords, MD   REASON FOR CONSULTATION:  Left upper lobe lung carcinoma.   HISTORY OF PRESENT ILLNESS:  The patient is a 60 year old female with a  long history of smoking, who presented to [facility] in early  May with pneumonia symptoms.  Chest x-ray was done that showed a left  upper lobe lung mass.  Further studies including a CT scan of the chest  and PET scan showed extensive nodularity in the left upper lobe, a small  less than 1-cm lesion also in the left upper lobe, right upper lobe lung  mass, right lower lobe lung mass, and extensive mass in the left lower  lobe.  The left upper lobe lung mass, left lower lung mass, and right  lower lobe mass all were hypermetabolic on PET scan.  The right upper  lobe lesion and the indeterminate lesion in the left upper lobe was not  hypermetabolic.  Initially, a needle biopsy was done of the left upper  lobe lesion.  [facility] #[id number] showed poorly  differentiated non-small cell carcinoma.  Phenotypic features were  thought to be most compatible squamous or adenosquamous carcinoma.  An  MRI scan of the brain was performed that showed no evidence of  metastatic disease.  The patient was treated for pneumonia and was  presumed to have advanced stage lung cancer.  She quit smoking for the  past month.  A followup CT scan done last week showed complete  resolution of the left lower lobe lung lesion and the right lower lobe  lung lesion leaving the known diagnosed lung cancer in left upper lobe  and a  scarring in the right apex that was not hypermetabolic, presumably  old scar.  The patient is referred for consideration for surgical  resection of the left lung lesion.   CARDIAC RISK FACTORS:  The patient denies hypertension.  Lipid status is  unknown.  She has had no previous history of cardiac disease.  Denies  diabetes.  She has no previous history of stroke.   PAST SURGICAL HISTORY:  Cholecystectomy and hysterectomy more than 10  years ago.   SOCIAL HISTORY:  She has smoked for many years, quit 1 month ago, on  Chantix and has been smoke free at that time. The patient is married,  employed in the school cafeteria in [place], and does not use  alcohol.   FAMILY HISTORY:  The patient's father died at age 18 of a brain aneurysm  and hypertension.  Mother died at age 33 with poor details, but had  seizures and question of a brain tumor.  CURRENT MEDICATIONS:  Chantix 0.5 mg b.i.d.   DRUG ALLERGIES:  PENICILLIN causes rash.  CODEINE causes nausea.  WELLBUTRIN cause question of syncope many years ago.   REVIEW OF SYSTEMS:  CARDIAC:  The patient denies chest pain, denies  angina, denies shortness of breath, denies palpitations, denies lower  extremity edema, denies orthopnea or presyncope.  She notes syncope many  years ago while on Wellbutrin, none since stopping.  GENERAL:  The  patient has gained weight slightly because she had been eating increased  diet in anticipation of starting on chemo.  Denies fever, chills, or  night sweats over the past month.  Denies amaurosis or TIAs.  Denies  hemoptysis.  Denies wheezing.  Denies orthopnea.  She is not on any  hormone supplementation.  Denies polyuria or polydipsia.  Other review  of systems are negative.   PHYSICAL EXAMINATION:  VITAL SIGNS:  Blood pressure is 117/68, pulse 68,  respiratory rate 18, O2 sats 98%, the patient is 5 feet and 3 inches  tall, and weighs 120 pounds.  GENERAL:  She is awake, alert,  neurologically intact.  NECK:  She do not hear carotid bruits.  She has no masses in her neck.  No cervical, supraclavicular, or axillary adenopathy.  LUNGS:  Clear bilaterally without wheezing.  CARDIAC:  Regular rate and rhythm without murmur or gallop.  ABDOMEN:  Benign without palpable masses or tenderness.  LOWER EXTREMITIES:  Without swelling.  She has +2 DP and PT pulses  bilaterally.   Pulmonary function studies were done in the [place] office show an FEV1  of 2.38, MDV not done, vital capacity 2.99, and diffusion capacity 84%.  The patient's initial chest x-ray, CT scan, PET scan, and followup CT  scan are all reviewed and noted above.   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  There is no hypermetabolic or enlarged  mediastinal lymph nodes.  With these findings, it would be most  appropriate to proceed with left upper lobectomy and bronchoscopy rather  than initial chemotherapy and with mediastinal lymph node dissection.  This has been explained to the patient and her husband and daughter in  detail the risk of surgery including death, infection, stroke,  myocardial infarction, bleeding, blood transfusion, pulmonary embolus,  and/or persistent air leak have all been discussed in detail, and the  patient is willing to proceed, arranged to proceed on June 14.  The  patient is agreeable with this approach.      Sheliah Plane, MD  Electronically Signed     EG/MEDQ  D:  01/26/2009  T:  01/26/2009  Job:  [id number]   cc:   Valetta Mole. Swords, MD  Lajuana Matte, MD

## 2011-01-01 NOTE — Assessment & Plan Note (Signed)
OFFICE VISIT   SWAY, GUTTIERREZ  DOB:  12/30/1950                                        March 08, 2010  CHART #:  [id number]   The patient returns to the office today in followup after the left upper  lobectomy for a 3.1-cm T2 N0 M0 IB carcinoma of the lung that was  resected on January 30, 2009.  Overall, she seems to be doing reasonably  well.  She has noted that the recent hot weather is making her asthmatic  disease slightly worse.  Dr. Marchelle Gearing has adjusted her bronchodilator  therapy.  She has had no hemoptysis and overall poor progressing well.   On exam, her blood pressure 126/78, pulse is 84, respiratory rate is 18  on room air, O2 sat is 98%.  I do not appreciate any cervical or  supraclavicular adenopathy.  Her lungs are clear bilaterally without  wheezing.  The left chest incisions are clear.  She has no axillary  adenopathy.  No calf tenderness.  No pedal edema.   A CT scan was performed by Dr. Marchelle Gearing In his office in followup and  showed a decrease in the ground glass opacity in the posterior-inferior  right upper lobe thought most likely inflammatory.  There was no  evidence of mediastinal or hilar adenopathy.   Overall, the patient has no evidence of recurrence now 1 year post  resection.  She notes that Dr. Marchelle Gearing had arranged for her to have a  repeat CT scan in 6 months.  I will see her after this repeat scan is  done.   Sheliah Plane, MD  Electronically Signed   EG/MEDQ  D:  03/08/2010  T:  03/08/2010  Job:  [id number]   cc:   Kalman Shan, MD  Valetta Mole. Swords, MD

## 2011-01-01 NOTE — Op Note (Signed)
[name], [name]               ACCOUNT NO.:  [id number]   MEDICAL RECORD NO.:  [id number]          PATIENT TYPE:  INP   LOCATION:  3308                         FACILITY:  [facility]   PHYSICIAN:  Brittney Plane, MD    DATE OF BIRTH:  05-23-1951   DATE OF PROCEDURE:  01/30/2009  DATE OF DISCHARGE:                               OPERATIVE REPORT   PREOPERATIVE DIAGNOSIS:  Left upper lobe lung lesion, non-small cell.   POSTOPERATIVE DIAGNOSIS:  Left upper lobe lung lesion, non-small cell.   SURGICAL PROCEDURE:  Bronchoscopy, left video-assisted thoracoscopy,  mini thoracotomy, left upper lobectomy with mediastinal lymph node  dissection and placement of On-Q device.   SURGEON:  Brittney Plane, MD   FIRST ASSISTANT:  Brittney Clack, PA-C   BRIEF HISTORY:  The patient is a 60 year old female with previous  history of smoking who presented with pneumonia symptoms.  CT scan and  PET scan done showed extensive hypermetabolic activity in the left upper  lobe, left lower lobe, and in the right lower lobe.  In addition, she  had areas of mass lesions involving the right upper lobe, but was not  hypermetabolic and a small 1-cm area in the left upper lobe.  Initially,  this was thought to be stage IV carcinoma of the lung.  However, CT scan  showed a complete resolution of the mass in the left lower lobe and  right lower lobe.  There were no mediastinal activities.  With this  resolution, it was felt that the patient did not have stage IV carcinoma  of the lung, but an inflammatory lung condition that had resolved  specially in the left lower lobe, but with biopsy-proven non-small-cell  cancer in the left upper lobe.  The left upper lobectomy was recommended  to the patient, agreed and signed informed consent.  Pulmonary function  studies preoperatively were adequate for lobectomy.   DESCRIPTION OF PROCEDURE:  The patient underwent general endotracheal  anesthesia and double-lumen  endotracheal tube was placed.  Through the  endotracheal tube, fiberoptic bronchoscope was passed to the  subsegmental level of both the left and the right without any obvious  endobronchial lesion.  The scope was then removed.  The patient was  turned to the lateral decubitus position where the left chest was  prepped with Betadine and draped in the usual sterile manner.  Initially, a small incision was placed in the midaxillary line at about  the fourth intercostal space.  Through this, a port was placed in the  chest and the pleural space was examined with a videoscope.  There were  adhesions at the apex of the lung that were fibrinous and did not appear  to involve tumor.  A small thoracotomy incision was made in fourth  intercostal space and separate anterior port site was graded to proceed  with lobectomy.  The fissure was well developed.  Dissection was carried  along the pulmonary artery and the branches to the upper lobe were  identified.  The pulmonary artery branch to the lingula was identified,  encircled and stapled through the port site  with a vascular stapler.  Three further arterial branches to the upper lobe were identified, each  individually encircled and stapled.  The lung was then rotated  posteriorly and the pulmonary vein to the upper lobe was identified and  encircled and stapled with a vascular stapler.  Along with the  dissection, lymph nodes from the 10L and 4L area were dissected out and  submitted separately to pathology for permanent section.  In addition,  an 8 lymph node was taken off the descending aorta and submitted.  With  the upper lobe vascular vessels divided, the remaining portion of the  fissure was stapled across and divided.  TIA staples were placed across  the left upper lobe bronchus.  Proper inflation of the lower lobe lung  was insured.  The bronchus was then divided and specimens submitted for  pathology, but the larger lesion and the smaller  lesion in the upper  lobe easily palpable.  Frozen section of the large lesion confirmed the  previous needle biopsy, non-small-cell carcinoma.  The smaller lesion in  the left upper lobe by frozen section was thought to be organizing  pneumonic process, not malignant.  With this specimen removed, the  bronchial stump which had been stable was also oversewn with 4-0 Vicryl  sutures.  She was placed on the bronchial stump.  The bronchial stump  was tested and was without any air leak.  Two 28 chest tubes were left  in place through the previous port sites.  An On-Q single tube was then  tunneled subpleurally along the posterior ribs and secured in place.  The lung was then reinflated.  Two Vicryl chest tube sutures were  placed.  The chest wall was then closed in layers with 0 Vicryl suture  and muscle layers and 2-0 Vicryl in the subcutaneous tissue and 3-0  subcuticular stitch in skin edges.  Dry dressings were applied.  Through  the port sites, the 2 chest tubes were then placed and secured in place.  The patient's blood loss was less than 100 mL.  The patient was awakened  and extubated in the operating room and transferred to the recovery room  having tolerated the procedure without obvious complication.  Sponge and  needle count was reported as correct at the completion of the procedure.      Brittney Plane, MD  Electronically Signed     EG/MEDQ  D:  01/30/2009  T:  01/31/2009  Job:  [id number]   cc:   Brittney Matte, MD  Brittney Mole. Swords, MD

## 2011-01-01 NOTE — Discharge Summary (Signed)
[name], [name]               ACCOUNT NO.:  [id number]   MEDICAL RECORD NO.:  [id number]          PATIENT TYPE:  INP   LOCATION:  5503                         FACILITY:  [facility]   PHYSICIAN:  Gordy Savers, MDDATE OF BIRTH:  12/18/1950   DATE OF ADMISSION:  12/24/2008  DATE OF DISCHARGE:  12/28/2008                               DISCHARGE SUMMARY   FINAL DIAGNOSES:  1. Left lower lobe pneumonia.  2. A 3-cm left upper lobe mass status post fine-needle aspiration      biopsy, left upper lobe mass.  3. History of tobacco use.   DISCHARGE MEDICATIONS:  1. Avelox 400 mg daily for 5 additional days.  2. Chantix 0.5 mg daily for 3 days followed by 0.5 mg twice daily.   HISTORY OF PRESENT ILLNESS:  The patient is a 60 year old female who  presented to the hospital complaining of fever, chills, cough, and  worsening shortness of breath.  The patient was evaluated in the ED  setting and noted to have a left lower lobe infiltrate consistent with  pneumonia.  On arrival, her temperature was 101.4 degrees with a pulse  of 113 and respiratory rate of 22.  WBC count was elevated at 14.8.  The  patient was subsequently admitted for further evaluation and treatment  of community-acquired pneumonia.   PAST MEDICAL HISTORY:  Pertinent for a prior cholecystectomy and  hysterectomy.   SOCIAL HISTORY:  Married, active smoker at the time of admission.   ALLERGIES:  PENICILLIN and CODEINE.   LABORATORY DATA AND HOSPITAL COURSE:  The patient was admitted to the  hospital and initially placed on vancomycin as well as Avelox, both  intravenously.  Prior to antibiotic treatment, blood cultures were  obtained as well as urinalysis for Legionella and pneumococcal antigen.  Nasal swab was also obtained.  The patient was treated with nebulizer  treatments and her clinical course was marked by steady improvement.  After 48 hours, vancomycin therapy was discontinued and the patient was  maintained  on IV Avelox 400 mg daily.  Her temperature quickly  normalized and her clinical status during the hospital course gradually  improved.  At the time of discharge, she was afebrile and minimally  symptomatic with nonproductive cough.  She was gaining strength daily.  A CT scan of the chest with contrast was obtained that revealed a  lobular lesion in the left lung apex as well as lobular densities in the  left lower lobe as well as associated ground-glass opacities.  These  findings were felt to represent lung carcinoma with metastases, although  infection could not be excluded.  The patient also underwent PET scan  imaging, this revealed a solid mass within the left upper lobe that was  worrisome for primary lung cancer.  There was also noted to be a left  lower lobe consolidation and also exhibited malignant range FDG uptake.  There is also a solid nodule within the right middle lobe also  exhibiting malignant range FDG uptake.  One day prior to admission, the  patient underwent CT-guided lung core and aspiration biopsy of the  left  upper lobe lung lesion.  There was a small less than 5% postprocedural  pneumothorax.  Followup chest x-ray revealed no change.  At the time of  this dictation, pathology report is pending.   DISPOSITION:  The patient will be discharged today with office followup  in 2 days.  She was discharged on Avelox to complete 5 additional days  of therapy.  She will also be discharged on Chantix.  In 2 days, the  patient will be reassessed in the office and pathology report and  further management discussed.   CONDITION ON DISCHARGE:  Stable.      Gordy Savers, MD  Electronically Signed     PFK/MEDQ  D:  12/28/2008  T:  12/28/2008  Job:  [id number]

## 2011-01-01 NOTE — Assessment & Plan Note (Signed)
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  The patient is  doing well postoperatively.  She still has some left chest wall  soreness, but now 3 weeks postop is improving.  She is to increase her  physical activity appropriately.  She has had no shortness of breath.  No cough.  No fever or chills.   PHYSICAL EXAMINATION:  VITAL SIGNS:  Her blood pressure 122/79, pulse is  104, respiratory rate 18, and O2 sats 97%.  LUNGS:  Clear bilaterally.  CHEST:  Her port sites and incision on the left chest are all healing  well without evidence of infection.  EXTREMITIES:  She has no pedal edema.  No tenderness in the calves.   Followup chest x-ray shows satisfactorily expansion of the lung without  effusion and without pneumothorax.   Overall, she is making very good progress, now 3 weeks postoperatively.  Her case was discussed postsurgery at the multidisciplinary oncology  clinic meeting and it was decided that close observation only was  indicated.  No further plans for chemotherapy.  I do plan to see the  patient back between 2 and 3 months with a followup chest x-ray.  At 6  months postoperatively, we will consider CT scan of chest.   Sheliah Plane, MD  Electronically Signed   EG/MEDQ  D:  02/21/2009  T:  02/22/2009  Job:  [id number]   cc:   Lajuana Matte, MD  Kalman Shan, MD

## 2011-01-24 ENCOUNTER — Other Ambulatory Visit: Payer: Self-pay | Admitting: *Deleted

## 2011-01-24 DIAGNOSIS — Z902 Acquired absence of lung [part of]: Secondary | ICD-10-CM

## 2011-03-18 ENCOUNTER — Ambulatory Visit (INDEPENDENT_AMBULATORY_CARE_PROVIDER_SITE_OTHER)
Admission: RE | Admit: 2011-03-18 | Discharge: 2011-03-18 | Disposition: A | Discharge status: Home / Self Care | Payer: BC Managed Care – PPO | Source: Ambulatory Visit | Attending: Internal Medicine | Admitting: Internal Medicine

## 2011-03-18 DIAGNOSIS — Z902 Acquired absence of lung [part of]: Secondary | ICD-10-CM

## 2011-03-18 DIAGNOSIS — Z9889 Other specified postprocedural states: Secondary | ICD-10-CM

## 2011-03-28 ENCOUNTER — Telehealth: Payer: Self-pay | Admitting: Internal Medicine

## 2011-03-28 ENCOUNTER — Encounter: Payer: Self-pay | Admitting: Internal Medicine

## 2011-03-28 MED ORDER — TIOTROPIUM BROMIDE MONOHYDRATE 18 MCG IN CAPS: 18.0000 ug | ORAL_CAPSULE | Freq: Every day | RESPIRATORY_TRACT | Status: DC

## 2011-03-28 NOTE — Telephone Encounter (Signed)
Notes Recorded by Kalman Shan, MD on 03/26/2011 at 12:55 PM No recurrence of cancer on CT chest on side operated which is left but on opposite side the right side on lower lobe some subtle infiltrates probably reflects infection or recent bronchitis. Pls have her come in to discuss   Called, spoke with pt.  She is aware MR would like her to come in to discus CT results - OV scheduled for tomorrow at 4:15, Mar 29, 2011 -- pt aware.  She is also requesting rx for spiriva - [facility].  Rx sent -- pt aware.

## 2011-03-29 ENCOUNTER — Ambulatory Visit (INDEPENDENT_AMBULATORY_CARE_PROVIDER_SITE_OTHER): Payer: BC Managed Care – PPO | Admitting: Internal Medicine

## 2011-03-29 ENCOUNTER — Encounter: Payer: Self-pay | Admitting: Internal Medicine

## 2011-03-29 VITALS — BP 104/70 | HR 87 | Temp 98.4°F | Ht 63.0 in | Wt 132.0 lb

## 2011-03-29 DIAGNOSIS — J984 Other disorders of lung: Secondary | ICD-10-CM

## 2011-03-29 DIAGNOSIS — J441 Chronic obstructive pulmonary disease with (acute) exacerbation: Secondary | ICD-10-CM | POA: Insufficient documentation

## 2011-03-29 DIAGNOSIS — C3410 Malignant neoplasm of upper lobe, unspecified bronchus or lung: Secondary | ICD-10-CM

## 2011-03-29 DIAGNOSIS — J449 Chronic obstructive pulmonary disease, unspecified: Secondary | ICD-10-CM

## 2011-03-29 DIAGNOSIS — R911 Solitary pulmonary nodule: Secondary | ICD-10-CM | POA: Insufficient documentation

## 2011-03-29 NOTE — Assessment & Plan Note (Signed)
This appeared new on surveillance CT during Jan 2011. Nearly resolved July 2011. Uncanbged on CT chest Jan 2012. New RLL infiltrates probably due to viral symptoms July 2012    plan   repeat ct 6 months - needs surveillance till Jan 2013

## 2011-03-29 NOTE — Assessment & Plan Note (Signed)
Currently stable. No desat.  Plan Continue spiriva and symbicort Flu shot in fall advised

## 2011-03-29 NOTE — Progress Notes (Signed)
Subjective:    Patient ID: [name], female    DOB: May 21, 1951, 60 y.o.   MRN: [id number]  HPI  Problem List 1. COPD with baseline class 2 dyspnea and mild basedline cough - MS genotype in Jan 2012. -  >Gold stage 0 prior to Lobectomy in June 2010   - Gold stage 3 in October 2010 with exertional desaturations   - Normalized PFT Nov 2010 with spiriva and symbicort without exertional desaturations    - No exertional desaturation 09/12/2010 2. s/p LUL lung cancer - lobectomy June 2010.  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 new RUL nodule since july 2011. -Stable CT Jan 2012 4. Recurrent AECOPD - sept 2011 and nov 2011 both treated as outpatient with usual abx/steroids. 5. Hx of Tobacco - quit may 2012  OV 03/29/2011: Followup for all of above active issues. Smoking still in remission. Had mild viral bronchitis symptoms around 03/11/2011 c/w very mild ae-copd. Did not call us or take Rx. Now resolved but left with residual fatigue and resolving cough/dyspnea. CT  03/08/2011 done 1 week into aecopd symptoms reflects the acute symptoms with RLL viral infiltrates. Otherwise stable ct and no evidence of cancer recurrence. (CT independently reivewed). Walking desat test 185 feet x 3 laps: no desaturation  Past Medical History  Diagnosis Date  . Depression   . Hyperlipidemia   . Lung mass     LLL  . COPD (chronic obstructive pulmonary disease)      No family history on file.   History   Social History  . Marital Status: Married    Spouse Name: N/A    Number of Children: N/A  . Years of Education: N/A   Occupational History  . Not on file.   Social History Main Topics  . Smoking status: Former Smoker -- 1.0 packs/day for 40 years    Types: Cigarettes    Quit date: 12/17/2008  . Smokeless tobacco: Not on file  . Alcohol Use: No  . Drug Use: No  . Sexually Active: Not on file   Other Topics Concern  . Not on file    Social History Narrative  . No narrative on file     Allergies  Allergen Reactions  . Codeine     REACTION: nausea  . Penicillins     REACTION: rash     Outpatient Prescriptions Prior to Visit  Medication Sig Dispense Refill  . albuterol (VENTOLIN HFA) 108 (90 BASE) MCG/ACT inhaler Inhale 2 puffs into the lungs every 6 (six) hours as needed.        . budesonide-formoterol (SYMBICORT) 80-4.5 MCG/ACT inhaler Inhale 2 puffs into the lungs 2 (two) times daily.        . Multiple Vitamin (MULTIVITAMIN) tablet Take 1 tablet by mouth daily.        Marland Kitchen tiotropium (SPIRIVA HANDIHALER) 18 MCG inhalation capsule Place 1 capsule (18 mcg total) into inhaler and inhale daily.  30 capsule  2      Review of Systems  Constitutional: Negative for fever and unexpected weight change.  HENT: Negative for ear pain, nosebleeds, congestion, sore throat, rhinorrhea, sneezing, trouble swallowing, dental problem, postnasal drip and sinus pressure.   Eyes: Negative for redness and itching.  Respiratory: Positive for cough. Negative for chest tightness, shortness of breath and wheezing.   Cardiovascular: Negative for palpitations and leg swelling.  Gastrointestinal: Negative for nausea and vomiting.  Genitourinary: Negative for dysuria.  Musculoskeletal: Negative for joint swelling.  Skin: Negative for rash.  Neurological: Negative for headaches.  Hematological: Does not bruise/bleed easily.  Psychiatric/Behavioral: Negative for dysphoric mood. The patient is not nervous/anxious.        Objective:   Physical Exam  Vitals reviewed. Constitutional: She is oriented to person, place, and time. She appears well-developed and well-nourished. No distress.  HENT:  Head: Normocephalic and atraumatic.  Right Ear: External ear normal.  Left Ear: External ear normal.  Mouth/Throat: Oropharynx is clear and moist. No oropharyngeal exudate.       Upper dentures  Eyes: Conjunctivae and EOM are normal. Pupils are  equal, round, and reactive to light. Right eye exhibits no discharge. Left eye exhibits no discharge. No scleral icterus.  Neck: Normal range of motion. Neck supple. No JVD present. No tracheal deviation present. No thyromegaly present.  Cardiovascular: Normal rate, regular rhythm, normal heart sounds and intact distal pulses.  Exam reveals no gallop and no friction rub.   No murmur heard. Pulmonary/Chest: Effort normal and breath sounds normal. No respiratory distress. She has no wheezes. She has no rales. She exhibits no tenderness.  Abdominal: Soft. Bowel sounds are normal. She exhibits no distension and no mass. There is no tenderness. There is no rebound and no guarding.  Musculoskeletal: Normal range of motion. She exhibits no edema and no tenderness.  Lymphadenopathy:    She has no cervical adenopathy.  Neurological: She is alert and oriented to person, place, and time. She has normal reflexes. No cranial nerve deficit. She exhibits normal muscle tone. Coordination normal.  Skin: Skin is warm and dry. No rash noted. She is not diaphoretic. No erythema. No pallor.  Psychiatric: She has a normal mood and affect. Her behavior is normal. Judgment and thought content normal.          Assessment & Plan:

## 2011-03-29 NOTE — Assessment & Plan Note (Signed)
In remission  Plan Serial ct once a year every summer for this. Explained new recs are for life. She and husband are not keen on this strategy due to xrt fears. They have agreed for serial CT till the 5 year mark which is 2015 summer

## 2011-03-29 NOTE — Patient Instructions (Signed)
#  COPD  - this is stable  - we will walk you for oxygen levels today - continue your spiriva and symbicort   - you are coming off a mild attack currently  - if this does not improve or it gets worse, call or come sooner #Rt lung nodule  - the old one is stable but tehre are new ones related to recent viral infection - you need repeat ct chest in 6 months #Left side lung cancer  - no evidence of recurrence #Followup 6 months with repeat CT chest without contrast

## 2011-03-29 NOTE — Assessment & Plan Note (Signed)
She is just coming of a mild AECOPD that spontaneously is resolving. Advised no need to rx with antibiotics or pred but to call if worse or not getting better or recurrent

## 2011-04-25 ENCOUNTER — Ambulatory Visit: Payer: BC Managed Care – PPO | Admitting: Cardiothoracic Surgery

## 2011-07-30 ENCOUNTER — Telehealth: Payer: Self-pay | Admitting: Internal Medicine

## 2011-07-30 MED ORDER — BUDESONIDE-FORMOTEROL FUMARATE 80-4.5 MCG/ACT IN AERO: 2.0000 | INHALATION_SPRAY | Freq: Two times a day (BID) | RESPIRATORY_TRACT | Status: DC

## 2011-07-30 MED ORDER — ALBUTEROL SULFATE HFA 108 (90 BASE) MCG/ACT IN AERS: 2.0000 | INHALATION_SPRAY | Freq: Four times a day (QID) | RESPIRATORY_TRACT | Status: DC | PRN

## 2011-07-30 NOTE — Telephone Encounter (Signed)
Called and spoke with pt.  Pt aware rx for Ventolin and Symbicort sent to pharmacy.

## 2011-08-05 ENCOUNTER — Telehealth: Payer: Self-pay | Admitting: Internal Medicine

## 2011-08-05 MED ORDER — LEVOFLOXACIN 500 MG PO TABS: ORAL_TABLET | ORAL | Status: DC

## 2011-08-05 MED ORDER — OSELTAMIVIR PHOSPHATE 75 MG PO CAPS: ORAL_CAPSULE | ORAL | Status: DC

## 2011-08-05 MED ORDER — PREDNISONE 10 MG PO TABS: ORAL_TABLET | ORAL | Status: DC

## 2011-08-05 NOTE — Telephone Encounter (Signed)
Dtr called stating patient unwell  Spoke to patient. She has not had flu shot this season despite advice to contrary in august 2012.  She states that a lady in church had cough, runny nose on 08/02/11  And was confirmed as flu on 08/04/11. One day after exposure to this person, on 08/03/11 - started as sore throat. Then Runny nose, cough, fever, dont feel good. Currently sore throat improved but feeling dry. Currently not more dyspneic. More cough though. No sputum. Lot of malaise, myalgia, feels weak, tired. Lower appetite but able to keep up. Able to keep up with fluids and drinking good. No wheeze.   ASSESSMENT  - Possible Flu - No AECOPD yet  PLAN  - tamiflu 75mg  twice daily x 5 days  - drink lot of fluids  - if worsens with sputum, increased cough, wheeze,: , levoflox 500mg  once daily x 5 days (generic) and Take prednisone 40 mg daily x 2 days, then 20mg  daily x 2 days, then 10mg  daily x 2 days, then 5mg  daily x 2 days and stop - further worsening go to ER, urgent care, office  Triage: Please call meds in annd go over instructions on plan again with her to make sure she has understood properly

## 2011-08-05 NOTE — Telephone Encounter (Signed)
I spoke with pt and is aware of MR's recs. rx's have been sent to the pharmacy. Pt also voiced her understanding of the directions for these medications.

## 2011-10-18 ENCOUNTER — Telehealth: Payer: Self-pay | Admitting: Internal Medicine

## 2011-10-18 DIAGNOSIS — R911 Solitary pulmonary nodule: Secondary | ICD-10-CM

## 2011-10-18 NOTE — Telephone Encounter (Signed)
Per last ov note, CT Chest was due in Feb 2013. Spoke with pt and notified will place order for her to have this scheduled before her 11/01/11 appt here. Order sent to [facility].

## 2011-10-23 ENCOUNTER — Ambulatory Visit (INDEPENDENT_AMBULATORY_CARE_PROVIDER_SITE_OTHER)
Admission: RE | Admit: 2011-10-23 | Discharge: 2011-10-23 | Disposition: A | Discharge status: Home / Self Care | Payer: BC Managed Care – PPO | Source: Ambulatory Visit | Attending: Internal Medicine | Admitting: Internal Medicine

## 2011-10-23 DIAGNOSIS — R911 Solitary pulmonary nodule: Secondary | ICD-10-CM

## 2011-11-01 ENCOUNTER — Ambulatory Visit (INDEPENDENT_AMBULATORY_CARE_PROVIDER_SITE_OTHER): Payer: BC Managed Care – PPO | Admitting: Internal Medicine

## 2011-11-01 ENCOUNTER — Encounter: Payer: Self-pay | Admitting: Internal Medicine

## 2011-11-01 VITALS — BP 112/68 | HR 63 | Temp 97.8°F | Ht 63.0 in | Wt 135.4 lb

## 2011-11-01 DIAGNOSIS — Z09 Encounter for follow-up examination after completed treatment for conditions other than malignant neoplasm: Secondary | ICD-10-CM

## 2011-11-01 DIAGNOSIS — Z85118 Personal history of other malignant neoplasm of bronchus and lung: Secondary | ICD-10-CM

## 2011-11-01 DIAGNOSIS — J449 Chronic obstructive pulmonary disease, unspecified: Secondary | ICD-10-CM

## 2011-11-01 DIAGNOSIS — R911 Solitary pulmonary nodule: Secondary | ICD-10-CM

## 2011-11-01 DIAGNOSIS — C3410 Malignant neoplasm of upper lobe, unspecified bronchus or lung: Secondary | ICD-10-CM

## 2011-11-01 DIAGNOSIS — Z08 Encounter for follow-up examination after completed treatment for malignant neoplasm: Secondary | ICD-10-CM

## 2011-11-01 NOTE — Progress Notes (Signed)
Subjective:    Patient ID: [name], female    DOB: 1950-08-21, 61 y.o.   MRN: [id number]  HPI Problem List 1. COPD with baseline class 2 dyspnea and mild basedline cough - MS genotype in Jan 2012. -  >Gold stage 0 prior to Lobectomy in June 2010   - Gold stage 3 in October 2010 with exertional desaturations   - Normalized PFT Nov 2010 with spiriva and symbicort without exertional desaturations    - No exertional desaturation 09/12/2010 2. s/p LUL lung cancer - lobectomy June 2010.  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 new RUL nodule since july 2011.  -Stable CT Jan 2012 and March 2013 4. Recurrent AECOPD  - sept 2011 and nov 2011 both treated as outpatient with usual abx/steroids. 5. Hx of Tobacco  - quit may 2012    IOV 11/01/2011  Feels well. No complaints. Here for above problems and for CT test results. Wants to stretch fu once a year. Dyspnea is class 1-2 and stable. Compliat with mdi. Smoking still in remission.     Ct Chest Wo Contrast  10/23/2011  *RADIOLOGY REPORT*  Clinical Data: Lung cancer status post left upper lobectomy, follow- up pulmonary nodule  CT CHEST WITHOUT CONTRAST  Technique:  Multidetector CT imaging of the chest was performed following the standard protocol without IV contrast.  Comparison: CT chest dated 03/18/2011  Findings: Prior left upper lung resection.  Stable mild ground- glass opacity/nodule in the posterior right upper lobe (series 3/image 19).  Prior ground-glass tree-in-bud nodularity in the lateral right lower lobe has resolved.  Mild centrilobular emphysematous changes.  Right apical pleural parenchymal scarring. No pleural effusion or pneumothorax.  Visualized thyroid is unremarkable.  The heart is normal in size.  No pericardial effusion.  Coronary atherosclerosis.  There are calcifications of the aortic arch.  No suspicious mediastinal or axillary lymphadenopathy.   Visualized upper abdomen is notable for probable hepatic cysts.  Degenerative changes of the visualized thoracolumbar spine.  IMPRESSION: Prior left upper lung resection.  No evidence of metastatic disease in the chest.  Prior ground-glass tree-in-bud nodularity in the lateral right lower lobe has resolved, likely infectious/inflammatory.  Stable mild ground-glass opacity / nodule in the posterior right upper lobe.  Original Report Authenticated By: Charline Bills, M.D.    Past, Family, Social reviewed: reviewed. Retired feb 2013. Currently busy with church. Plans  Walk for hospice [place] planned. Father in law passed away.   Review of Systems  Constitutional: Negative for fever and unexpected weight change.  HENT: Negative for ear pain, nosebleeds, congestion, sore throat, rhinorrhea, sneezing, trouble swallowing, dental problem, postnasal drip and sinus pressure.   Eyes: Negative for redness and itching.  Respiratory: Negative for cough, chest tightness, shortness of breath and wheezing.   Cardiovascular: Negative for palpitations and leg swelling.  Gastrointestinal: Negative for nausea and vomiting.  Genitourinary: Negative for dysuria.  Musculoskeletal: Negative for joint swelling.  Skin: Negative for rash.  Neurological: Negative for headaches.  Hematological: Does not bruise/bleed easily.  Psychiatric/Behavioral: Negative for dysphoric mood. The patient is not nervous/anxious.        Objective:   Physical Exam Vitals reviewed. Constitutional: She is oriented to person, place, and time. She appears well-developed and well-nourished. No distress.  HENT:  Head: Normocephalic and atraumatic.  Right Ear: External ear normal.  Left Ear: External ear normal.  Mouth/Throat: Oropharynx is  clear and moist. No oropharyngeal exudate.       Upper dentures  Eyes: Conjunctivae and EOM are normal. Pupils are equal, round, and reactive to light. Right eye exhibits no discharge. Left eye  exhibits no discharge. No scleral icterus.  Neck: Normal range of motion. Neck supple. No JVD present. No tracheal deviation present. No thyromegaly present.  Cardiovascular: Normal rate, regular rhythm, normal heart sounds and intact distal pulses.  Exam reveals no gallop and no friction rub.   No murmur heard. Pulmonary/Chest: Effort normal and breath sounds normal. No respiratory distress. She has no wheezes. She has no rales. She exhibits no tenderness.  Abdominal: Soft. Bowel sounds are normal. She exhibits no distension and no mass. There is no tenderness. There is no rebound and no guarding.  Musculoskeletal: Normal range of motion. She exhibits no edema and no tenderness.  Lymphadenopathy:    She has no cervical adenopathy.  Neurological: She is alert and oriented to person, place, and time. She has normal reflexes. No cranial nerve deficit. She exhibits normal muscle tone. Coordination normal.  Skin: Skin is warm and dry. No rash noted. She is not diaphoretic. No erythema. No pallor.  Psychiatric: She has a normal mood and affect. Her behavior is normal. Judgment and thought content normal.          Assessment & Plan:

## 2011-11-01 NOTE — Patient Instructions (Signed)
#  COPD  - this is stable  -  continue your spiriva and symbicort ; take a few samples   -  Have flu shot in fall #Rt lung nodule  - these are all stable  next ct scan in 1 year  #Left side lung cancer  - no evidence of recurrence #Followup 12 months with CT chest without contrast  call or come sooner if there are problems

## 2011-11-04 ENCOUNTER — Encounter: Payer: Self-pay | Admitting: Internal Medicine

## 2011-11-04 NOTE — Assessment & Plan Note (Signed)
#  Left side lung cancer  - no evidence of recurrence #Followup 12 months with CT chest without contrast  call or come sooner if there are problems

## 2011-11-04 NOTE — Assessment & Plan Note (Signed)
#  Rt lung nodule  - these are all stable  next ct scan in 1 year

## 2011-11-04 NOTE — Assessment & Plan Note (Signed)
#  COPD  - this is stable  -  continue your spiriva and symbicort ; take a few samples   -  Have flu shot in fall  

## 2012-01-27 ENCOUNTER — Ambulatory Visit (INDEPENDENT_AMBULATORY_CARE_PROVIDER_SITE_OTHER): Payer: BC Managed Care – PPO | Admitting: Family

## 2012-01-27 ENCOUNTER — Encounter: Payer: Self-pay | Admitting: Family

## 2012-01-27 VITALS — BP 126/80 | Temp 99.0°F | Wt 134.0 lb

## 2012-01-27 DIAGNOSIS — R05 Cough: Secondary | ICD-10-CM

## 2012-01-27 DIAGNOSIS — J209 Acute bronchitis, unspecified: Secondary | ICD-10-CM

## 2012-01-27 MED ORDER — AZITHROMYCIN 250 MG PO TABS: 250.0000 mg | ORAL_TABLET | Freq: Every day | ORAL | Status: DC

## 2012-01-27 MED ORDER — BENZONATATE 200 MG PO CAPS: 200.0000 mg | ORAL_CAPSULE | Freq: Two times a day (BID) | ORAL | Status: AC | PRN

## 2012-01-27 NOTE — Progress Notes (Signed)
Subjective:    Patient ID: [name], female    DOB: 08-29-1950, 61 y.o.   MRN: [id number]  HPI 61 year old white female, nonsmoker, patient of Dr. Cato Mulligan is in today with complaints of cough, congestion, sore throat x3 days. Her cough is productive with brown phlegm. She's been taken over-the-counter NyQuil without relief. She is a past smoker who quit as a result of lung cancer diagnosed in 2010. She's had CT scans every 6 months at that time that have been negative. She had a right upper lobectomy. Denies any fever, myalgias, lightheadedness, dizziness, chest pain, palpitations, shortness of breath or edema. She has a history of COPD.   Review of Systems  Constitutional: Positive for fatigue.  HENT: Positive for sore throat.   Eyes: Negative.   Respiratory: Positive for cough.   Cardiovascular: Negative.   Musculoskeletal: Negative.   Neurological: Negative.   Hematological: Negative.   Psychiatric/Behavioral: Negative.    Past Medical History  Diagnosis Date  . Depression   . Hyperlipidemia   . Lung mass     LLL  . COPD (chronic obstructive pulmonary disease)     History   Social History  . Marital Status: Married    Spouse Name: N/A    Number of Children: N/A  . Years of Education: N/A   Occupational History  . Not on file.   Social History Main Topics  . Smoking status: Former Smoker -- 1.0 packs/day for 40 years    Types: Cigarettes    Quit date: 12/17/2008  . Smokeless tobacco: Not on file  . Alcohol Use: No  . Drug Use: No  . Sexually Active: Not on file   Other Topics Concern  . Not on file   Social History Narrative  . No narrative on file    Past Surgical History  Procedure Date  . Cholecystectomy   . Vesicovaginal fistula closure w/ tah   . Lobectomy 2010    Left upper    No family history on file.  Allergies  Allergen Reactions  . Codeine     REACTION: nausea  . Penicillins     REACTION: rash    Current Outpatient  Prescriptions on File Prior to Visit  Medication Sig Dispense Refill  . albuterol (VENTOLIN HFA) 108 (90 BASE) MCG/ACT inhaler Inhale 2 puffs into the lungs every 6 (six) hours as needed for shortness of breath.  1 Inhaler  3  . budesonide-formoterol (SYMBICORT) 80-4.5 MCG/ACT inhaler Inhale 2 puffs into the lungs 2 (two) times daily.  1 Inhaler  5  . Multiple Vitamin (MULTIVITAMIN) tablet Take 1 tablet by mouth daily.        Marland Kitchen tiotropium (SPIRIVA HANDIHALER) 18 MCG inhalation capsule Place 1 capsule (18 mcg total) into inhaler and inhale daily.  30 capsule  2    BP 126/80  Temp(Src) 99 F (37.2 C) (Oral)  Wt 134 lb (60.782 kg)chart    Objective:   Physical Exam  Constitutional: She is oriented to person, place, and time. She appears well-developed and well-nourished.  HENT:  Right Ear: External ear normal.  Left Ear: External ear normal.  Nose: Nose normal.  Mouth/Throat: Oropharynx is clear and moist.  Neck: Normal range of motion. Neck supple.  Cardiovascular: Normal rate, regular rhythm and normal heart sounds.   Pulmonary/Chest: Effort normal. She has wheezes.       Bilaterally very mild expiratory wheezing.  Musculoskeletal: Normal range of motion.  Neurological: She is alert and oriented to person,  place, and time.  Skin: Skin is warm and dry.  Psychiatric: She has a normal mood and affect.          Assessment & Plan:  Assessment: Acute bronchitis, cough, COPD  Plan: Empirically cover patient with a Z-Pak as directed. Tessalon Perles 200 mg twice a day as needed for cough. Rest. Drink plenty of fluids. Patient to call the office symptoms worsen or persist. Recheck a schedule, when necessary.

## 2012-01-27 NOTE — Patient Instructions (Signed)

## 2012-02-17 ENCOUNTER — Other Ambulatory Visit: Payer: Self-pay | Admitting: Internal Medicine

## 2012-05-11 ENCOUNTER — Telehealth: Payer: Self-pay | Admitting: Internal Medicine

## 2012-05-11 NOTE — Telephone Encounter (Signed)
Patient wants Rx script sent in to [facility] locally where she lives so they can give pneumovax. For some reason they told her to wait30 days after flu shot to give pneumovax but this was wrong advice. Please cal in script  Thanks  MR

## 2012-05-12 MED ORDER — PNEUMOCOCCAL VAC POLYVALENT 25 MCG/0.5ML IJ INJ: 0.5000 mL | INJECTION | Freq: Once | INTRAMUSCULAR | Status: DC

## 2012-05-12 NOTE — Telephone Encounter (Signed)
rx sent to [facility] in [place]. Pt is aware.Carron Curie, CMA

## 2012-10-26 ENCOUNTER — Ambulatory Visit (INDEPENDENT_AMBULATORY_CARE_PROVIDER_SITE_OTHER)
Admission: RE | Admit: 2012-10-26 | Discharge: 2012-10-26 | Disposition: A | Discharge status: Home / Self Care | Payer: BC Managed Care – PPO | Source: Ambulatory Visit | Attending: Internal Medicine | Admitting: Internal Medicine

## 2012-10-26 DIAGNOSIS — Z85118 Personal history of other malignant neoplasm of bronchus and lung: Secondary | ICD-10-CM

## 2012-10-26 DIAGNOSIS — Z08 Encounter for follow-up examination after completed treatment for malignant neoplasm: Secondary | ICD-10-CM

## 2012-10-26 DIAGNOSIS — R911 Solitary pulmonary nodule: Secondary | ICD-10-CM

## 2012-10-26 DIAGNOSIS — Z09 Encounter for follow-up examination after completed treatment for conditions other than malignant neoplasm: Secondary | ICD-10-CM

## 2012-10-30 ENCOUNTER — Encounter: Payer: Self-pay | Admitting: Internal Medicine

## 2012-10-30 ENCOUNTER — Ambulatory Visit (INDEPENDENT_AMBULATORY_CARE_PROVIDER_SITE_OTHER): Payer: BC Managed Care – PPO | Admitting: Internal Medicine

## 2012-10-30 DIAGNOSIS — Z9189 Other specified personal risk factors, not elsewhere classified: Secondary | ICD-10-CM

## 2012-10-30 DIAGNOSIS — I2510 Atherosclerotic heart disease of native coronary artery without angina pectoris: Secondary | ICD-10-CM

## 2012-10-30 DIAGNOSIS — R0789 Other chest pain: Secondary | ICD-10-CM

## 2012-10-30 DIAGNOSIS — I2584 Coronary atherosclerosis due to calcified coronary lesion: Secondary | ICD-10-CM

## 2012-10-30 DIAGNOSIS — C3410 Malignant neoplasm of upper lobe, unspecified bronchus or lung: Secondary | ICD-10-CM

## 2012-10-30 DIAGNOSIS — J449 Chronic obstructive pulmonary disease, unspecified: Secondary | ICD-10-CM

## 2012-10-30 NOTE — Patient Instructions (Addendum)
#  COPD  - this is stable  -  continue your spiriva and symbicort ; take a few samples   -  Have flu shot in fall  #Rt lung nodule  - these are all stable   next ct scan in 1 year   #Left side lung cancer  - no evidence of recurrence  #Coronary artery calcification - Please see Dr. Marca Ancona or Dr. Tonny Bollman or Dr. Melene Muller at [facility] cardiology  #Followup 12 months with CT chest without contrast, spirometry at followup  call or come sooner if there are problems You can start exercising in the gym after cardiac stress test is normal or cleared by the cardiologist

## 2012-10-30 NOTE — Progress Notes (Signed)
Subjective:    Patient ID: [name], female    DOB: April 02, 1951, 62 y.o.   MRN: [id number]  HPI Problem List 1. COPD with baseline class 2 dyspnea and mild basedline cough - MS genotype in Jan 2012. - Gold stage 0 prior to Lobectomy in June 2010   - Gold stage 3 in October 2010 with exertional desaturations   - Normalized PFT Nov 2010 with spiriva and symbicort without exertional desaturations    - No exertional desaturation 09/12/2010 and march 2014  2. s/p LUL lung cancer - lobectomy June 2010.  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 new RUL nodule since july 2011.  -Stable CT Jan 2012 and March 2013, March 2014  4. Recurrent AECOPD  - sept 2011 and nov 2011 both treated as outpatient with usual abx/steroids. - 2013: none  5. Hx of Tobacco  - quit may 2012  6. Coronary Artery Calcification March 2014: Calcifications within the LAD and RCA coronary arteries noted.  OV 10/30/2012  Followup for abobe. Last visit was 1 year ago. Since then no recurrence in smoking. No aecopd. Retired. Doing well. Has had pneumovax. Symptoms are minimal - copd cat score is 8. Onl issue is moderate fatigue and rare atypical chest discomfort nos that is chronic. CT chest march 2014 reviewed does not show recurrence of cancer and rul nodule is stable. There is evidence of coronary artery calcification (she has positive risk factors).  NEver seen cardiology. NEvr had stress test. She is interested in starting exericse program    CAT COPD Symptom & Quality of Life Score (GSK trademark) 0 is no burden. 5 is highest burden 10/30/2012   Never Cough -> Cough all the time 2  No phlegm in chest -> Chest is full of phlegm 0  No chest tightness -> Chest feels very tight 2  No dyspnea for 1 flight stairs/hill -> Very dyspneic for 1 flight of stairs 1  No limitations for ADL at home -> Very limited with ADL at home 0   Confident leaving home -> Not at all confident leaving home 0  Sleep soundly -> Do not sleep soundly because of lung condition 0  Lots of Energy -> No energy at all 3  TOTAL Score (max 40)  8      CT chest dated 10/26/12 Comparison: 10/23/2011   Findings: Lungs/pleura: No pleural effusion identified. No  airspace consolidation or atelectasis identified. Sub solid nodule  within the right upper lobe is again noted and appears unchanged  from previous exam measuring 7 mm, image 20/series 3. No new or  enlarging pulmonary nodules or masses.  Heart/Mediastinum: Heart size is within normal limits.   No  enlarged mediastinal or hilar lymph nodes. similar to previous  exam.  Bones/Musculoskeletal: No axillary or supraclavicular adenopathy.  There are no worrisome lytic or sclerotic bone lesions identified.  IMPRESSION:  1.Stable sub solid nodule within the right upper lobe. This has  been stable since 08/24/2009 and is likely benign.  2. No evidence of metastatic disease.  Original Report Authenticated By: Signa Kell, M.D.    Review of Systems  Constitutional: Negative for fever and unexpected weight change.  HENT: Negative for ear pain, nosebleeds, congestion, sore throat, rhinorrhea, sneezing, trouble swallowing, dental problem, postnasal drip and sinus pressure.   Eyes: Negative for redness and itching.  Respiratory: Positive for cough. Negative for  chest tightness, shortness of breath and wheezing.   Cardiovascular: Negative for palpitations and leg swelling.  Gastrointestinal: Negative for nausea and vomiting.  Genitourinary: Negative for dysuria.  Musculoskeletal: Negative for joint swelling.  Skin: Negative for rash.  Neurological: Negative for headaches.  Hematological: Does not bruise/bleed easily.  Psychiatric/Behavioral: Negative for dysphoric mood. The patient is not nervous/anxious.       Current outpatient prescriptions:albuterol (VENTOLIN HFA) 108 (90 BASE)  MCG/ACT inhaler, Inhale 2 puffs into the lungs every 6 (six) hours as needed for shortness of breath., Disp: 1 Inhaler, Rfl: 3;  budesonide-formoterol (SYMBICORT) 80-4.5 MCG/ACT inhaler, Inhale 2 puffs into the lungs 2 (two) times daily., Disp: 1 Inhaler, Rfl: 5 SPIRIVA HANDIHALER 18 MCG inhalation capsule, INHALE CONTENTS OF ONE CAPSULE EVERY DAY VIA INHALER, Disp: 30 capsule, Rfl: 7  Objective:   Physical Exam   Physical Exam Vitals reviewed. Constitutional: She is oriented to person, place, and time. She appears well-developed and well-nourished. No distress.  HENT:  Head: Normocephalic and atraumatic.  Right Ear: External ear normal.  Left Ear: External ear normal.  Mouth/Throat: Oropharynx is clear and moist. No oropharyngeal exudate.       Upper dentures  Eyes: Conjunctivae and EOM are normal. Pupils are equal, round, and reactive to light. Right eye exhibits no discharge. Left eye exhibits no discharge. No scleral icterus.  Neck: Normal range of motion. Neck supple. No JVD present. No tracheal deviation present. No thyromegaly present.  Cardiovascular: Normal rate, regular rhythm, normal heart sounds and intact distal pulses.  Exam reveals no gallop and no friction rub.   No murmur heard. Pulmonary/Chest: Effort normal and breath sounds normal. No respiratory distress. She has no wheezes. She has no rales. She exhibits no tenderness.  Abdominal: Soft. Bowel sounds are normal. She exhibits no distension and no mass. There is no tenderness. There is no rebound and no guarding.  Musculoskeletal: Normal range of motion. She exhibits no edema and no tenderness.  Lymphadenopathy:    She has no cervical adenopathy.  Neurological: She is alert and oriented to person, place, and time. She has normal reflexes. No cranial nerve deficit. She exhibits normal muscle tone. Coordination normal.  Skin: Skin is warm and dry. No rash noted. She is not diaphoretic. No erythema. No pallor.  Psychiatric:  She has a normal mood and affect. Her behavior is normal. Judgment and thought content normal.       Assessment & Plan:         Objective:

## 2012-10-31 NOTE — Assessment & Plan Note (Signed)
#  COPD  - this is stable  -  continue your spiriva and symbicort ; take a few samples   -  Have flu shot in fall

## 2012-10-31 NOTE — Assessment & Plan Note (Signed)
She has cad risk factors. Has chronic atypical chest discomfort. CT chest march 2014 shows co artery calcification.   Plan  #Coronary artery calcification - Please see Dr. Marca Ancona or Dr. Tonny Bollman or Dr. Melene Muller at [facility] cardiology

## 2012-10-31 NOTE — Assessment & Plan Note (Addendum)
#  Rt lung nodule  - these are all stable   next ct scan in 1 year   #Left side lung cancer  - no evidence of recurrence

## 2012-11-17 ENCOUNTER — Encounter: Payer: Self-pay | Admitting: Family Medicine

## 2012-11-17 ENCOUNTER — Ambulatory Visit (INDEPENDENT_AMBULATORY_CARE_PROVIDER_SITE_OTHER): Payer: BC Managed Care – PPO | Admitting: Family Medicine

## 2012-11-17 VITALS — BP 142/84 | HR 94 | Temp 98.8°F | Wt 142.0 lb

## 2012-11-17 DIAGNOSIS — J019 Acute sinusitis, unspecified: Secondary | ICD-10-CM

## 2012-11-17 MED ORDER — AZITHROMYCIN 250 MG PO TABS: 250.0000 mg | ORAL_TABLET | Freq: Every day | ORAL | Status: AC

## 2012-11-17 NOTE — Progress Notes (Signed)
  Subjective:    Patient ID: [name], female    DOB: [birth year], 62 y.o.   MRN: [id number]  HPI Here for 3 days of [name] fever, sinus drainage, ST, and a dry cough. Drinking fluids.    Review of Systems  Constitutional: Positive for fever.  HENT: Positive for postnasal drip and sinus pressure.   Eyes: Negative.   Respiratory: Positive for cough.        Objective:   Physical Exam  Constitutional: She appears well-developed and well-nourished.  HENT:  Right Ear: External ear normal.  Left Ear: External ear normal.  Nose: Nose normal.  Mouth/Throat: Oropharynx is clear and moist.  Eyes: Conjunctivae are normal.  Pulmonary/Chest: Effort normal and breath sounds normal.  Lymphadenopathy:    She has no cervical adenopathy.          Assessment & Plan:  Add Mucinex

## 2012-11-24 ENCOUNTER — Encounter: Payer: Self-pay | Admitting: Cardiology

## 2012-11-24 ENCOUNTER — Ambulatory Visit (INDEPENDENT_AMBULATORY_CARE_PROVIDER_SITE_OTHER): Payer: BC Managed Care – PPO | Admitting: Cardiology

## 2012-11-24 VITALS — BP 142/85 | HR 72 | Ht 63.0 in | Wt 143.8 lb

## 2012-11-24 DIAGNOSIS — I10 Essential (primary) hypertension: Secondary | ICD-10-CM

## 2012-11-24 DIAGNOSIS — I2510 Atherosclerotic heart disease of native coronary artery without angina pectoris: Secondary | ICD-10-CM

## 2012-11-24 DIAGNOSIS — E785 Hyperlipidemia, unspecified: Secondary | ICD-10-CM

## 2012-11-24 DIAGNOSIS — R079 Chest pain, unspecified: Secondary | ICD-10-CM

## 2012-11-24 DIAGNOSIS — I2584 Coronary atherosclerosis due to calcified coronary lesion: Secondary | ICD-10-CM

## 2012-11-24 LAB — BASIC METABOLIC PANEL
BUN: 10 mg/dL (ref 6–23)
CO2: 29 mEq/L (ref 19–32)
Chloride: 103 mEq/L (ref 96–112)
Creatinine, Ser: 0.7 mg/dL (ref 0.4–1.2)
Glucose, Bld: 101 mg/dL — ABNORMAL HIGH (ref 70–99)

## 2012-11-24 LAB — LIPID PANEL
Cholesterol: 304 mg/dL — ABNORMAL HIGH (ref 0–200)
VLDL: 33.8 mg/dL (ref 0.0–40.0)

## 2012-11-24 NOTE — Patient Instructions (Addendum)
Your physician recommends that you have lab work today--Lipid profile/BMET.   Your physician has requested that you have en exercise stress myoview. For further information please visit [email or web address] Please follow instruction sheet, as given.  Start aspirin 81mg  daily.  Take and record your blood pressure daily. I will call you in 2 weeks to get the readings. Luana Shu.  Your physician wants you to follow-up in: 1 year with Dr Shirlee Latch. (April 2015).You will receive a reminder letter in the mail two months in advance. If you don't receive a letter, please call our office to schedule the follow-up appointment.  Marland Kitchen

## 2012-11-24 NOTE — Progress Notes (Signed)
Patient ID: [name], female   DOB: [birth year], 62 y.o.   MRN: [id number] PCP: Adline Mango  62 yo with history of COPD and prior lung cancer (s/p lobectomy) presents for cardiology evaluation after chest CT showed calcification in the LAD and the RCA.  The CT was done to followup pulmonary nodules.  She does get occasional chest pain.  This tends to be rare and atypical.  However, about 2 months ago she had a long episode of burning chest pain lasting for about an hour that woke her up at night.  Her other episodes are short and are not associated with exertion.  She fatigues with long walks but denies shortness of breath, per se. She quit smoking in 5/12.  She wants to start going to the gym but is concerned about the coronary calcification and her occasional chest pain episodes.  BP is elevated today.  She has a BP cuff at home but does not use it.   ECG: NSR, iRBBB, PVC  PMH: 1. COPD: h/o acute exacerbations.  Not on home oxygen.  2. Squamous cell lung cancer: s/p lobectomy in 6/10.   3. CAD: Chest CT in 3/14 showed calcification in the LAD and RCA.   SH: Quit smoking in 5/12.  Married, retired.    FH: Father with HTN.  No history of MI.   ROS: All systems reviewed and negative except as per HPI.   Current Outpatient Prescriptions  Medication Sig Dispense Refill  . albuterol (VENTOLIN HFA) 108 (90 BASE) MCG/ACT inhaler Inhale 2 puffs into the lungs every 6 (six) hours as needed for shortness of breath.  1 Inhaler  3  . budesonide-formoterol (SYMBICORT) 80-4.5 MCG/ACT inhaler Inhale 2 puffs into the lungs 2 (two) times daily.  1 Inhaler  5  . SPIRIVA HANDIHALER 18 MCG inhalation capsule INHALE CONTENTS OF ONE CAPSULE EVERY DAY VIA INHALER  30 capsule  7  . aspirin EC 81 MG tablet Take 1 tablet (81 mg total) by mouth daily.       No current facility-administered medications for this visit.    BP 142/85  Pulse 72  Ht 5\' 3"  (1.6 m)  Wt 143 lb 12.8 oz (65.227 kg)  BMI 25.48  kg/m2  SpO2 97% General: NAD Neck: No JVD, no thyromegaly or thyroid nodule.  Lungs: Clear to auscultation bilaterally prolonged expiratory phase.  CV: Nondisplaced PMI.  Heart regular S1/S2, +S4, no murmur.  No peripheral edema.  No carotid bruit.  Normal pedal pulses.  Abdomen: Soft, nontender, no hepatosplenomegaly, no distention.  Skin: Intact without lesions or rashes.  Neurologic: Alert and oriented x 3.  Psych: Normal affect. Extremities: No clubbing or cyanosis.  HEENT: Normal.   Assessment/Plan: 1. CAD: Patient does have CAD, based on coronary calcification noted on CT.  She has chest pain occasionally but it is atypical.   As she wants to start an exercise program, I think that it would be reasonable to get a functional study (Lexiscan Sestamibi) to look for ischemia.  Given known CAD, she should start ASA 81 mg daily and a statin.  I will check her lipids and will pick which statin to use based on her LDL level.   2. HTN: BP is elevated today.  She does have an S4 on exam, which concerns me that her BP has been running high long-term.  She has a home BP cuff, I will have her check BP daily and record.  We will call for readings in  2 wks and if numbers are high, she should start on ramipril.    Marca Ancona 11/24/2012 2:05 PM

## 2012-11-27 ENCOUNTER — Telehealth: Payer: Self-pay | Admitting: Cardiology

## 2012-11-27 DIAGNOSIS — E785 Hyperlipidemia, unspecified: Secondary | ICD-10-CM

## 2012-11-27 MED ORDER — ATORVASTATIN CALCIUM 40 MG PO TABS: 40.0000 mg | ORAL_TABLET | Freq: Every day | ORAL | Status: DC

## 2012-11-27 NOTE — Telephone Encounter (Signed)
Message copied by Antony Odea on Fri Nov 27, 2012  4:08 PM ------      Message from: Laurey Morale      Created: Fri Nov 27, 2012  3:17 PM       Extremely high LDL.  Start atorvastatin 40 mg daily.  Lipids/LFTs in 2 months. ------

## 2012-11-27 NOTE — Telephone Encounter (Signed)
Results reviewed, medication explained and accepted, lab date given.

## 2012-11-27 NOTE — Telephone Encounter (Signed)
New Problem:    Patient called in returning your call regarding her labs.  Please call back.

## 2012-12-07 ENCOUNTER — Ambulatory Visit (HOSPITAL_COMMUNITY): Payer: BC Managed Care – PPO | Attending: Cardiology | Admitting: Radiology

## 2012-12-07 VITALS — BP 124/92 | HR 75 | Ht 64.0 in | Wt 139.0 lb

## 2012-12-07 DIAGNOSIS — Z87891 Personal history of nicotine dependence: Secondary | ICD-10-CM | POA: Insufficient documentation

## 2012-12-07 DIAGNOSIS — I2584 Coronary atherosclerosis due to calcified coronary lesion: Secondary | ICD-10-CM

## 2012-12-07 DIAGNOSIS — I4510 Unspecified right bundle-branch block: Secondary | ICD-10-CM

## 2012-12-07 DIAGNOSIS — R079 Chest pain, unspecified: Secondary | ICD-10-CM

## 2012-12-07 DIAGNOSIS — R002 Palpitations: Secondary | ICD-10-CM | POA: Insufficient documentation

## 2012-12-07 DIAGNOSIS — I10 Essential (primary) hypertension: Secondary | ICD-10-CM

## 2012-12-07 DIAGNOSIS — R000 Tachycardia, unspecified: Secondary | ICD-10-CM | POA: Insufficient documentation

## 2012-12-07 DIAGNOSIS — R0789 Other chest pain: Secondary | ICD-10-CM | POA: Insufficient documentation

## 2012-12-07 DIAGNOSIS — E785 Hyperlipidemia, unspecified: Secondary | ICD-10-CM | POA: Insufficient documentation

## 2012-12-07 MED ORDER — REGADENOSON 0.4 MG/5ML IV SOLN
0.4000 mg | Freq: Once | INTRAVENOUS | Status: AC
  Administered 2012-12-07: 0.4 mg via INTRAVENOUS

## 2012-12-07 MED ORDER — TECHNETIUM TC 99M SESTAMIBI GENERIC - CARDIOLITE
11.0000 | Freq: Once | INTRAVENOUS | Status: AC | PRN
  Administered 2012-12-07: 11 via INTRAVENOUS

## 2012-12-07 MED ORDER — TECHNETIUM TC 99M SESTAMIBI GENERIC - CARDIOLITE
33.0000 | Freq: Once | INTRAVENOUS | Status: AC | PRN
  Administered 2012-12-07: 33 via INTRAVENOUS

## 2012-12-07 NOTE — Progress Notes (Addendum)
[facility] SITE 3 NUCLEAR MED [address] [place], [place] [address] [phone]    Cardiology Nuclear Med Study  [name] is a 62 y.o. female     MRN : [id number]     DOB: 1950-12-11  Procedure Date: 12/07/2012  Nuclear Med Background Indication for Stress Test:  Evaluation for Ischemia History:  3/14 IO:[id number] calcification Cardiac Risk Factors: History of Smoking, Hypertension, Lipids and RBBB  Symptoms:  Chest Pain (last episode of chest discomfort was about a month ago), Dizziness, Fatigue with and without Exertion, Palpitations and Rapid HR   Nuclear Pre-Procedure Caffeine/Decaff Intake:  None > 12 hrs NPO After: 9:00pm   Lungs:  Clear. O2 Sat: 98% on room air. IV 0.9% NS with Angio Cath:  22g  IV Site: R Antecubital x 1, tolerated well IV Started by:  Irean Hong, RN  Chest Size (in):  36 Cup Size: A  Height: 5\' 4"  (1.626 m)  Weight:  139 lb (63.05 kg)  BMI:  Body mass index is 23.85 kg/(m^2). Tech Comments:  Spiriva and Symbicort inhaler this am    Nuclear Med Study 1 or 2 day study: 1 day  Stress Test Type:  Lexiscan  Reading MD: Dietrich Pates, MD  Order Authorizing Provider:  Marca Ancona, MD  Resting Radionuclide: Technetium 67m Sestamibi  Resting Radionuclide Dose: 11.0 mCi   Stress Radionuclide:  Technetium 19m Sestamibi  Stress Radionuclide Dose: 33.0 mCi           Stress Protocol Rest HR: 75 Stress HR: 136  Rest BP: 124/92 Stress BP: 147/88  Exercise Time (min): 2:00 METS: n/a   Predicted Max HR: 157 bpm % Max HR: 86.62 bpm Rate Pressure Product: 13244   Dose of Adenosine (mg):  n/a Dose of Lexiscan: 0.4 mg  Dose of Atropine (mg): n/a Dose of Dobutamine: n/a mcg/kg/min (at max HR)  Stress Test Technologist: Smiley Houseman, CMA-N  Nuclear Technologist:  Domenic Polite, CNMT     Rest Procedure:  Myocardial perfusion imaging was performed at rest 45 minutes following the intravenous administration of Technetium 44m Sestamibi.    Rest ECG: NSR.  IRBBB  Stress Procedure:  The patient received IV Lexiscan 0.4 mg over 15-seconds.  Technetium 66m Sestamibi injected at 30-seconds.  Quantitative spect images were obtained after a 45 minute delay. and The patient received IV Lexiscan 0.4 mg over 15-seconds with concurrent low level exercise and then Technetium 52m Sestamibi was injected at 30-seconds while the patient continued walking one more minute.  She c/o chest tightness with lexiscan.  Quantitative spect images were obtained after a 45-minute delay.  Stress ECG: No significant change from baseline ECG  QPS Raw Data Images: Soft tissue (diaprhagm) underlies heart. Stress Images:  Normal homogeneous uptake in all areas of the myocardium. Rest Images:  Normal homogeneous uptake in all areas of the myocardium. Subtraction (SDS):  No evidence of ischemia. Transient Ischemic Dilatation (Normal <1.22):  0.99 Lung/Heart Ratio (Normal <0.45):  0.33  Quantitative Gated Spect Images QGS EDV:  72 ml QGS ESV:  31 ml  Impression Exercise Capacity:  Lexiscan with low level exercise. BP Response:  Normal blood pressure response. Clinical Symptoms:  Mild chest pain/dyspnea. ECG Impression:  No significant ST segment change suggestive of ischemia. Comparison with Prior Nuclear Study: No previous nuclear study performed  Overall Impression:  Normal stress nuclear study.  LV Ejection Fraction: 57%.  LV Wall Motion:  NL LV Function; NL Wall Motion  Dietrich Pates  Normal study.   Marca Ancona 12/08/2012

## 2012-12-09 NOTE — Progress Notes (Signed)
Pt.notified

## 2012-12-10 ENCOUNTER — Telehealth: Payer: Self-pay | Admitting: *Deleted

## 2012-12-10 NOTE — Telephone Encounter (Signed)
Follow up ° ° °Pt returning your call °

## 2012-12-10 NOTE — Telephone Encounter (Signed)
Pt.notified

## 2012-12-10 NOTE — Telephone Encounter (Signed)
LMTCB

## 2012-12-10 NOTE — Telephone Encounter (Signed)
HTN: BP is elevated today. She does have an S4 on exam, which concerns me that her BP has been running high long-term. She has a home BP cuff, I will have her check BP daily and record. We will call for readings in 2 wks and if numbers are high, she should start on ramipril.  12/10/12--recent BP readings 4/9 138/86   4/10 121/81   4/11  121/84   4/12  122/89   4/13 121/84   4/14 120/75   4/15 121/77 4/16  128/77   4/17 122/75 4/18 120/75 4/19 121/84 4/20 123/80 4/21 129/81 4/22 129/81 4/23 124/84. I will forward to Dr Shirlee Latch for review

## 2012-12-10 NOTE — Telephone Encounter (Signed)
Good bp, no changes.  

## 2013-01-25 ENCOUNTER — Other Ambulatory Visit (INDEPENDENT_AMBULATORY_CARE_PROVIDER_SITE_OTHER): Payer: BC Managed Care – PPO

## 2013-01-25 DIAGNOSIS — E785 Hyperlipidemia, unspecified: Secondary | ICD-10-CM

## 2013-01-25 LAB — HEPATIC FUNCTION PANEL
ALT: 14 U/L (ref 0–35)
AST: 16 U/L (ref 0–37)
Bilirubin, Direct: 0 mg/dL (ref 0.0–0.3)
Total Bilirubin: 0.8 mg/dL (ref 0.3–1.2)

## 2013-01-25 LAB — BASIC METABOLIC PANEL
BUN: 10 mg/dL (ref 6–23)
Calcium: 9.1 mg/dL (ref 8.4–10.5)
GFR: 97.72 mL/min (ref >60.00)
Glucose, Bld: 80 mg/dL (ref 70–99)

## 2013-01-25 LAB — LIPID PANEL: Total CHOL/HDL Ratio: 3

## 2013-03-15 ENCOUNTER — Encounter: Payer: Self-pay | Admitting: Family Medicine

## 2013-03-15 ENCOUNTER — Ambulatory Visit (INDEPENDENT_AMBULATORY_CARE_PROVIDER_SITE_OTHER): Payer: BC Managed Care – PPO | Admitting: Family Medicine

## 2013-03-15 VITALS — BP 132/90 | Temp 98.4°F | Wt 144.0 lb

## 2013-03-15 DIAGNOSIS — R350 Frequency of micturition: Secondary | ICD-10-CM

## 2013-03-15 LAB — POCT URINALYSIS DIPSTICK
Bilirubin, UA: NEGATIVE
Ketones, UA: NEGATIVE
Protein, UA: NEGATIVE
Spec Grav, UA: 1.01
pH, UA: 7

## 2013-03-15 MED ORDER — CIPROFLOXACIN HCL 500 MG PO TABS: 500.0000 mg | ORAL_TABLET | Freq: Two times a day (BID) | ORAL | Status: DC

## 2013-03-15 NOTE — Progress Notes (Signed)
Chief Complaint  Patient presents with  . Urinary Frequency    HPI:  62 yo F pt of Dr. Marliss Coots here for acute visit for urinary frequency: -started: 2 days ago -symptoms: frequency, dysuria, thought saw a little blood in urine -denies: fevers, nausea, vomiting, flank pain, vaginal symptoms -hx of: UTIs  ROS: See pertinent positives and negatives per HPI.  Past Medical History  Diagnosis Date  . Depression   . Hyperlipidemia   . Lung mass     LLL  . COPD (chronic obstructive pulmonary disease)     No family history on file.  History   Social History  . Marital Status: Married    Spouse Name: N/A    Number of Children: N/A  . Years of Education: N/A   Social History Main Topics  . Smoking status: Former Smoker -- 1.00 packs/day for 40 years    Types: Cigarettes    Quit date: 12/17/2008  . Smokeless tobacco: None  . Alcohol Use: No  . Drug Use: No  . Sexually Active: None   Other Topics Concern  . None   Social History Narrative  . None    Current outpatient prescriptions:albuterol (VENTOLIN HFA) 108 (90 BASE) MCG/ACT inhaler, Inhale 2 puffs into the lungs every 6 (six) hours as needed for shortness of breath., Disp: 1 Inhaler, Rfl: 3;  aspirin EC 81 MG tablet, Take 1 tablet (81 mg total) by mouth daily., Disp: , Rfl: ;  atorvastatin (LIPITOR) 40 MG tablet, Take 1 tablet (40 mg total) by mouth daily., Disp: 30 tablet, Rfl: 6 budesonide-formoterol (SYMBICORT) 80-4.5 MCG/ACT inhaler, Inhale 2 puffs into the lungs 2 (two) times daily., Disp: 1 Inhaler, Rfl: 5;  SPIRIVA HANDIHALER 18 MCG inhalation capsule, INHALE CONTENTS OF ONE CAPSULE EVERY DAY VIA INHALER, Disp: 30 capsule, Rfl: 7;  ciprofloxacin (CIPRO) 500 MG tablet, Take 1 tablet (500 mg total) by mouth 2 (two) times daily., Disp: 6 tablet, Rfl: 0  EXAM:  Filed Vitals:   03/15/13 1031  BP: 132/90  Temp: 98.4 F (36.9 C)    Body mass index is 24.71 kg/(m^2).  GENERAL: vitals reviewed and listed above,  alert, oriented, appears well hydrated and in no acute distress  HEENT: atraumatic, conjunttiva clear, no obvious abnormalities on inspection of external nose and ears  NECK: no obvious masses on inspection  LUNGS: clear to auscultation bilaterally, no wheezes, rales or rhonchi, good air movement  CV: HRRR, no peripheral edema  ABD: BS+, soft, NTTP, no CVA TTP  MS: moves all extremities without noticeable abnormality  PSYCH: pleasant and cooperative, no obvious depression or anxiety  ASSESSMENT AND PLAN:  Discussed the following assessment and plan:  Urinary frequency - Plan: POCT urinalysis dipstick, Urine culture, ciprofloxacin (CIPRO) 500 MG tablet  -udip with tr blood and leuks; discussed may be UTI - will do empiric tx - risks discussed. Culture pending. To follow up if symptoms persist. Plenty of water. Azo for symptoms short term. -Patient advised to return or notify a doctor immediately if symptoms worsen or persist or new concerns arise.  There are no Patient Instructions on file for this visit.   Kriste Basque R.

## 2013-03-18 LAB — URINE CULTURE

## 2013-03-18 MED ORDER — NITROFURANTOIN MONOHYD MACRO 100 MG PO CAPS: 100.0000 mg | ORAL_CAPSULE | Freq: Two times a day (BID) | ORAL | Status: DC

## 2013-03-18 NOTE — Progress Notes (Signed)
Quick Note:  Left a message for pt to return call. Rx sent to pharmacy. ______

## 2013-03-18 NOTE — Addendum Note (Signed)
Addended by: Azucena Freed on: 03/18/2013 11:33 AM   Modules accepted: Orders, Medications

## 2013-03-18 NOTE — Progress Notes (Signed)
Quick Note:  Called and spoke with pt and pt is aware. ______ 

## 2013-04-20 ENCOUNTER — Ambulatory Visit (INDEPENDENT_AMBULATORY_CARE_PROVIDER_SITE_OTHER): Payer: BC Managed Care – PPO | Admitting: Family Medicine

## 2013-04-20 ENCOUNTER — Telehealth: Payer: Self-pay | Admitting: Internal Medicine

## 2013-04-20 VITALS — BP 140/98 | HR 68 | Temp 98.7°F | Wt 149.0 lb

## 2013-04-20 DIAGNOSIS — R300 Dysuria: Secondary | ICD-10-CM

## 2013-04-20 NOTE — Telephone Encounter (Signed)
[facility] Triage Call Report Triage Record Num: [id number] Operator: Patriciaann Clan Patient Name: [name] Call Date & Time: 04/19/2013 10:09:08AM Patient Phone: [phone] PCP: Valetta Mole. [facility] Patient Gender: Female PCP Fax : [phone] Patient DOB: [birth year] [facility] Name: [facility] Reason for Call: Caller: Kaneshia/Patient; PCP: Kriste Basque (Family Practice); CB#: [phone]; Call regarding Urinary Pain/Bleeding; Patient states she was seen in the office 03/18/13 and prescribed Macrobid X 7 days for a diagnosis of Urinary Tract Infection. Patient states symptoms resolved with the treatment. Patient states she again developed urinary urgency/frequency and burning with urination 04/17/13. Patient states she began taking OTC Azo with no improvement. Afebrile. Denies flank pain. Denies hematuria. Triage per Urinary Symptoms Protocol. No emergent sx identified. Disposition of " See Provider within 24 hours" obtained related to positive triage assessment for " Has one or more urinary tract sx and had not been previously evaluated." Care advice given per guidelines. Patient advised increased fluids. Call back parameters reviewed. Patient verbalizes understanding. Patient advised to be evaluated in the ED immediately if she develops fever hematuria, flank pain or increased sx. Patient verbalizes understanding. Appt. scheduled for 04/20/13 0915 with Dr. Kriste Basque. Protocol(s) Used: Urinary Symptoms - Female Recommended Outcome per Protocol: See Provider within 24 hours Reason for Outcome: Has one or more urinary tract symptoms AND has not been previously evaluated Care Advice: ~ Call provider if you develop flank or low back pain, fever, generally feel sick. ~ SYMPTOM / CONDITION MANAGEMENT Limit carbonated, alcoholic, and caffeinated beverages such as coffee, tea and soda. Avoid nonprescription cold and allergy medications that contain caffeine. Limit intake of  tomatoes, fruit juices (except for unsweetened cranberry juice), dairy products, spicy foods, sugar, and artificial sweeteners (aspartame or saccharine). Stop or decrease smoking. Reducing exposure to bladder irritants may help lessen urgency. ~ ~ Call provider if urine is pink, red, smoky or cola colored. Systemic Inflammatory Response Syndrome (SIRS): Watch for signs of a generalized, whole body infection. Occurs within days of a localized infection, especially of the urinary, GI, respiratory or nervous systems; or after a traumatic injury or invasive procedure. - Call EMS 911 if symptoms have worsened, such as increasing confusion or unusual drowsiness; cold and clammy skin; no urine output; rapid respiration (>30/min.) or slow respiration (<10/min.); struggling to breathe. - Go to the ED immediately for early symptoms of rapid pulse >90/min. or rapid breathing >20/min. at rest; chills; oral temperature >100.4 F (38 C) or <96.8 F (36 C) when associated with conditions noted. ~ Increase intake of fluids. Try to drink 8 oz. (.2 liter) every hour when awake, unless on restricted fluids for other medical reasons. Include at least two 8 oz. (.2 liter) glasses of unsweetened cranberry juice each day. Take sips of fluid or eat ice chips if nauseated or vomiting. ~ 04/19/2013 10:33:24AM Page 1 of 1 [id number]

## 2013-04-20 NOTE — Progress Notes (Signed)
Chief Complaint  Patient presents with  . Urinary Tract Infection    HPI:  Acute visit for dysuria: -started 2 days ago -symptoms: dysuria, frequency a few days ago - now resolved but wanted to check urine -denies: fevers, chills, NVD, flank pain of back pain or pelvic pain -hx UTI  ROS: See pertinent positives and negatives per HPI.  Past Medical History  Diagnosis Date  . Depression   . Hyperlipidemia   . Lung mass     LLL  . COPD (chronic obstructive pulmonary disease)     Past Surgical History  Procedure Laterality Date  . Cholecystectomy    . Vesicovaginal fistula closure w/ tah    . Lobectomy  2010    Left upper    No family history on file.  History   Social History  . Marital Status: Married    Spouse Name: N/A    Number of Children: N/A  . Years of Education: N/A   Social History Main Topics  . Smoking status: Former Smoker -- 1.00 packs/day for 40 years    Types: Cigarettes    Quit date: 12/17/2008  . Smokeless tobacco: Not on file  . Alcohol Use: No  . Drug Use: No  . Sexual Activity: Not on file   Other Topics Concern  . Not on file   Social History Narrative  . No narrative on file    Current outpatient prescriptions:albuterol (VENTOLIN HFA) 108 (90 BASE) MCG/ACT inhaler, Inhale 2 puffs into the lungs every 6 (six) hours as needed for shortness of breath., Disp: 1 Inhaler, Rfl: 3;  aspirin EC 81 MG tablet, Take 1 tablet (81 mg total) by mouth daily., Disp: , Rfl: ;  atorvastatin (LIPITOR) 40 MG tablet, Take 1 tablet (40 mg total) by mouth daily., Disp: 30 tablet, Rfl: 6 budesonide-formoterol (SYMBICORT) 80-4.5 MCG/ACT inhaler, Inhale 2 puffs into the lungs 2 (two) times daily., Disp: 1 Inhaler, Rfl: 5;  SPIRIVA HANDIHALER 18 MCG inhalation capsule, INHALE CONTENTS OF ONE CAPSULE EVERY DAY VIA INHALER, Disp: 30 capsule, Rfl: 7  EXAM:  Filed Vitals:   04/20/13 0917  BP: 140/98  Pulse: 68  Temp: 98.7 F (37.1 C)    Body mass index is  25.56 kg/(m^2).  GENERAL: vitals reviewed and listed above, alert, oriented, appears well hydrated and in no acute distress  HEENT: atraumatic, conjunttiva clear, no obvious abnormalities on inspection of external nose and ears  NECK: no obvious masses on inspection  LUNGS: clear to auscultation bilaterally, no wheezes, rales or rhonchi, good air movement  CV: HRRR, no peripheral edema  ABD: soft, NTTP, no CVA TTP  MS: moves all extremities without noticeable abnormality  PSYCH: pleasant and cooperative, no obvious depression or anxiety  ASSESSMENT AND PLAN:  Discussed the following assessment and plan:  Dysuria - Plan: POCT urinalysis dipstick  -urine dip only with tr leuks, discussed and will wait on pending culture given symptoms resolved - of course she was advised to call us if starts feeling bad in the meantime or symptoms return -Patient advised to return or notify a doctor immediately if symptoms worsen or persist or new concerns arise.  There are no Patient Instructions on file for this visit.   Kriste Basque R.

## 2013-04-22 LAB — URINE CULTURE: Colony Count: 40000

## 2013-04-22 MED ORDER — CIPROFLOXACIN HCL 500 MG PO TABS: 500.0000 mg | ORAL_TABLET | Freq: Two times a day (BID) | ORAL | Status: DC

## 2013-04-22 NOTE — Progress Notes (Signed)
Quick Note:  Called and spoke with pt and pt is aware. Rx sent to pharmacy. ______ 

## 2013-04-22 NOTE — Addendum Note (Signed)
Addended by: Azucena Freed on: 04/22/2013 12:57 PM   Modules accepted: Orders

## 2013-05-19 ENCOUNTER — Other Ambulatory Visit (INDEPENDENT_AMBULATORY_CARE_PROVIDER_SITE_OTHER): Payer: BC Managed Care – PPO

## 2013-05-19 DIAGNOSIS — R350 Frequency of micturition: Secondary | ICD-10-CM

## 2013-05-19 LAB — POCT URINALYSIS DIPSTICK
Blood, UA: NEGATIVE
Glucose, UA: NEGATIVE
Ketones, UA: NEGATIVE
Nitrite, UA: NEGATIVE
Protein, UA: NEGATIVE
Spec Grav, UA: 1.01
Urobilinogen, UA: 0.2

## 2013-05-23 LAB — URINE CULTURE

## 2013-05-23 MED ORDER — NITROFURANTOIN MONOHYD MACRO 100 MG PO CAPS: 100.0000 mg | ORAL_CAPSULE | Freq: Two times a day (BID) | ORAL | Status: DC

## 2013-05-23 NOTE — Addendum Note (Signed)
Addended by: Lindley Magnus on: 05/23/2013 04:54 PM   Modules accepted: Orders

## 2013-05-26 ENCOUNTER — Encounter: Payer: Self-pay | Admitting: Internal Medicine

## 2013-07-22 ENCOUNTER — Other Ambulatory Visit: Payer: Self-pay | Admitting: Cardiology

## 2013-07-27 ENCOUNTER — Telehealth: Payer: Self-pay | Admitting: Internal Medicine

## 2013-07-27 NOTE — Telephone Encounter (Signed)
Pt requesting a referral to surgeon due to unbearable left leg pain and lower back pain since August.  Pt states she will need an x-ray or CT, because she knows she will need to have surgery.

## 2013-07-28 ENCOUNTER — Encounter: Payer: Self-pay | Admitting: Internal Medicine

## 2013-07-30 NOTE — Telephone Encounter (Signed)
Pt has an appt with Padonda on Monday

## 2013-07-30 NOTE — Telephone Encounter (Signed)
We need to see her first- Have her see Oran Rein or any other provider

## 2013-08-02 ENCOUNTER — Encounter: Payer: Self-pay | Admitting: Family

## 2013-08-02 ENCOUNTER — Ambulatory Visit (INDEPENDENT_AMBULATORY_CARE_PROVIDER_SITE_OTHER): Payer: BC Managed Care – PPO | Admitting: Family

## 2013-08-02 VITALS — BP 140/90 | HR 91 | Wt 147.0 lb

## 2013-08-02 DIAGNOSIS — IMO0002 Reserved for concepts with insufficient information to code with codable children: Secondary | ICD-10-CM

## 2013-08-02 DIAGNOSIS — M545 Low back pain: Secondary | ICD-10-CM

## 2013-08-02 DIAGNOSIS — M5416 Radiculopathy, lumbar region: Secondary | ICD-10-CM

## 2013-08-02 MED ORDER — PREDNISONE 20 MG PO TABS: ORAL_TABLET | ORAL | Status: AC

## 2013-08-02 NOTE — Progress Notes (Signed)
Subjective:    Patient ID: [name], female    DOB: [birth year], 62 y.o.   MRN: [id number]  HPI  62 year old white female, nonsmoker some additional resources in today with complaints of [name] back pain that radiates down her left leg ongoing off and on x6 months. Pain originally started after helping her husband with some things onto a truck. She rates the pain 8-10 out of 10 and it varies in intensity. She also reports stiffness per se in the morning they usually loosens after being up for about 10-15 minutes. She's been taking 2 Aleve daily that typically help but recently has not been as effective.  Review of Systems  Constitutional: Negative.   HENT: Negative.   Respiratory: Negative.   Cardiovascular: Negative.   Gastrointestinal: Negative.   Endocrine: Negative.   Genitourinary: Negative.   Musculoskeletal: Positive for back pain.  Skin: Negative.   Neurological: Negative.   Hematological: Negative.   Psychiatric/Behavioral: Negative.    Past Medical History  Diagnosis Date  . Depression   . Hyperlipidemia   . Lung mass     LLL  . COPD (chronic obstructive pulmonary disease)     History   Social History  . Marital Status: Married    Spouse Name: N/A    Number of Children: N/A  . Years of Education: N/A   Occupational History  . Not on file.   Social History Main Topics  . Smoking status: Former Smoker -- 1.00 packs/day for 40 years    Types: Cigarettes    Quit date: 12/17/2008  . Smokeless tobacco: Not on file  . Alcohol Use: No  . Drug Use: No  . Sexual Activity: Not on file   Other Topics Concern  . Not on file   Social History Narrative  . No narrative on file    Past Surgical History  Procedure Laterality Date  . Cholecystectomy    . Vesicovaginal fistula closure w/ tah    . Lobectomy  2010    Left upper    History reviewed. No pertinent family history.  Allergies  Allergen Reactions  . Codeine     REACTION: nausea  .  Penicillins     REACTION: rash    Current Outpatient Prescriptions on File Prior to Visit  Medication Sig Dispense Refill  . albuterol (VENTOLIN HFA) 108 (90 BASE) MCG/ACT inhaler Inhale 2 puffs into the lungs every 6 (six) hours as needed for shortness of breath.  1 Inhaler  3  . aspirin EC 81 MG tablet Take 1 tablet (81 mg total) by mouth daily.      Marland Kitchen atorvastatin (LIPITOR) 40 MG tablet TAKE 1 TABLET BY MOUTH DAILY  30 tablet  3  . budesonide-formoterol (SYMBICORT) 80-4.5 MCG/ACT inhaler Inhale 2 puffs into the lungs 2 (two) times daily.  1 Inhaler  5  . SPIRIVA HANDIHALER 18 MCG inhalation capsule INHALE CONTENTS OF ONE CAPSULE EVERY DAY VIA INHALER  30 capsule  7  . ciprofloxacin (CIPRO) 500 MG tablet Take 1 tablet (500 mg total) by mouth 2 (two) times daily.  6 tablet  0   No current facility-administered medications on file prior to visit.    BP 140/90  Pulse 91  Wt 147 lb (66.679 kg)chart    Objective:   Physical Exam  Constitutional: She is oriented to person, place, and time. She appears well-developed and well-nourished.  Neck: Normal range of motion. Neck supple.  Cardiovascular: Normal rate, regular rhythm and normal heart  sounds.   Pulmonary/Chest: Effort normal and breath sounds normal.  Musculoskeletal: She exhibits tenderness. She exhibits no edema.  Neurological: She is alert and oriented to person, place, and time.  Skin: Skin is warm and dry.  Psychiatric: She has a normal mood and affect.          Assessment & Plan:  Assessment: 1. Lumbar radiculopathy 2. [name] back pain  Plan: Prednisone 60x3, 40x3, 20x3. Consider a CT scan of the L-spine if symptoms persist. Thereafter, we'll do MOBIC 15 mg once daily after prednisone taper is complete. Call the office with any questions or concerns. [name] back strengthening exercises provided today.

## 2013-08-02 NOTE — Patient Instructions (Signed)
Back Exercises These exercises may help you when beginning to rehabilitate your injury. Your symptoms may resolve with or without further involvement from your physician, physical therapist or athletic trainer. While completing these exercises, remember:   Restoring tissue flexibility helps normal motion to return to the joints. This allows healthier, less painful movement and activity.  An effective stretch should be held for at least 30 seconds.  A stretch should never be painful. You should only feel a gentle lengthening or release in the stretched tissue. STRETCH  Extension, Prone on Elbows   Lie on your stomach on the floor, a bed will be too soft. Place your palms about shoulder width apart and at the height of your head.  Place your elbows under your shoulders. If this is too painful, stack pillows under your chest.  Allow your body to relax so that your hips drop lower and make contact more completely with the floor.  Hold this position for __________ seconds.  Slowly return to lying flat on the floor. Repeat __________ times. Complete this exercise __________ times per day.  RANGE OF MOTION  Extension, Prone Press Ups   Lie on your stomach on the floor, a bed will be too soft. Place your palms about shoulder width apart and at the height of your head.  Keeping your back as relaxed as possible, slowly straighten your elbows while keeping your hips on the floor. You may adjust the placement of your hands to maximize your comfort. As you gain motion, your hands will come more underneath your shoulders.  Hold this position __________ seconds.  Slowly return to lying flat on the floor. Repeat __________ times. Complete this exercise __________ times per day.  RANGE OF MOTION- Quadruped, Neutral Spine   Assume a hands and knees position on a firm surface. Keep your hands under your shoulders and your knees under your hips. You may place padding under your knees for comfort.  Drop  your head and point your tail bone toward the ground below you. This will round out your low back like an angry cat. Hold this position for __________ seconds.  Slowly lift your head and release your tail bone so that your back sags into a large arch, like an old horse.  Hold this position for __________ seconds.  Repeat this until you feel limber in your low back.  Now, find your "sweet spot." This will be the most comfortable position somewhere between the two previous positions. This is your neutral spine. Once you have found this position, tense your stomach muscles to support your low back.  Hold this position for __________ seconds. Repeat __________ times. Complete this exercise __________ times per day.  STRETCH  Flexion, Single Knee to Chest   Lie on a firm bed or floor with both legs extended in front of you.  Keeping one leg in contact with the floor, bring your opposite knee to your chest. Hold your leg in place by either grabbing behind your thigh or at your knee.  Pull until you feel a gentle stretch in your low back. Hold __________ seconds.  Slowly release your grasp and repeat the exercise with the opposite side. Repeat __________ times. Complete this exercise __________ times per day.  STRETCH - Hamstrings, Standing  Stand or sit and extend your right / left leg, placing your foot on a chair or foot stool  Keeping a slight arch in your low back and your hips straight forward.  Lead with your chest and   lean forward at the waist until you feel a gentle stretch in the back of your right / left knee or thigh. (When done correctly, this exercise requires leaning only a small distance.)  Hold this position for __________ seconds. Repeat __________ times. Complete this stretch __________ times per day. STRENGTHENING  Deep Abdominals, Pelvic Tilt   Lie on a firm bed or floor. Keeping your legs in front of you, bend your knees so they are both pointed toward the ceiling and  your feet are flat on the floor.  Tense your lower abdominal muscles to press your low back into the floor. This motion will rotate your pelvis so that your tail bone is scooping upwards rather than pointing at your feet or into the floor.  With a gentle tension and even breathing, hold this position for __________ seconds. Repeat __________ times. Complete this exercise __________ times per day.  STRENGTHENING  Abdominals, Crunches   Lie on a firm bed or floor. Keeping your legs in front of you, bend your knees so they are both pointed toward the ceiling and your feet are flat on the floor. Cross your arms over your chest.  Slightly tip your chin down without bending your neck.  Tense your abdominals and slowly lift your trunk high enough to just clear your shoulder blades. Lifting higher can put excessive stress on the low back and does not further strengthen your abdominal muscles.  Control your return to the starting position. Repeat __________ times. Complete this exercise __________ times per day.  STRENGTHENING  Quadruped, Opposite UE/LE Lift   Assume a hands and knees position on a firm surface. Keep your hands under your shoulders and your knees under your hips. You may place padding under your knees for comfort.  Find your neutral spine and gently tense your abdominal muscles so that you can maintain this position. Your shoulders and hips should form a rectangle that is parallel with the floor and is not twisted.  Keeping your trunk steady, lift your right hand no higher than your shoulder and then your left leg no higher than your hip. Make sure you are not holding your breath. Hold this position __________ seconds.  Continuing to keep your abdominal muscles tense and your back steady, slowly return to your starting position. Repeat with the opposite arm and leg. Repeat __________ times. Complete this exercise __________ times per day. Document Released: 08/23/2005 Document  Revised: 10/28/2011 Document Reviewed: 11/17/2008 [employer] Patient Information 2014 [employer].  

## 2013-08-09 ENCOUNTER — Other Ambulatory Visit: Payer: Self-pay | Admitting: Family

## 2013-08-09 ENCOUNTER — Telehealth: Payer: Self-pay | Admitting: Family

## 2013-08-09 ENCOUNTER — Encounter: Payer: Self-pay | Admitting: Family

## 2013-08-09 DIAGNOSIS — M5416 Radiculopathy, lumbar region: Secondary | ICD-10-CM

## 2013-08-09 MED ORDER — MELOXICAM 15 MG PO TABS: 15.0000 mg | ORAL_TABLET | Freq: Every day | ORAL | Status: DC

## 2013-08-09 NOTE — Telephone Encounter (Signed)
Please advise I have ordered CT scan to be done asap. Start Mobic. Can pick up pain med RX if needed while out of town.

## 2013-08-09 NOTE — Telephone Encounter (Signed)
Left message to advise pt of Padonda's note. Advised pt to call with questions or concerns

## 2013-08-10 ENCOUNTER — Encounter: Payer: Self-pay | Admitting: Family

## 2013-08-10 ENCOUNTER — Telehealth: Payer: Self-pay

## 2013-08-10 MED ORDER — TRAMADOL HCL 50 MG PO TABS: 50.0000 mg | ORAL_TABLET | Freq: Three times a day (TID) | ORAL | Status: DC | PRN

## 2013-08-10 NOTE — Telephone Encounter (Signed)
Pt would like pain meds for but not a narcotic because she will be unable to pick Rx up from office. She states that she will get scan done after the holidays

## 2013-08-10 NOTE — Telephone Encounter (Signed)
Please phone in Tramadol

## 2013-08-10 NOTE — Telephone Encounter (Signed)
Rx phoned in.   

## 2013-08-16 ENCOUNTER — Inpatient Hospital Stay: Admission: RE | Admit: 2013-08-16 | Payer: BC Managed Care – PPO | Source: Ambulatory Visit

## 2013-08-18 ENCOUNTER — Other Ambulatory Visit: Payer: Self-pay | Admitting: Family

## 2013-08-18 ENCOUNTER — Ambulatory Visit (INDEPENDENT_AMBULATORY_CARE_PROVIDER_SITE_OTHER)
Admission: RE | Admit: 2013-08-18 | Discharge: 2013-08-18 | Disposition: A | Discharge status: Home / Self Care | Payer: BC Managed Care – PPO | Source: Ambulatory Visit | Attending: Family | Admitting: Family

## 2013-08-18 DIAGNOSIS — M479 Spondylosis, unspecified: Secondary | ICD-10-CM

## 2013-08-18 DIAGNOSIS — M5416 Radiculopathy, lumbar region: Secondary | ICD-10-CM

## 2013-08-18 DIAGNOSIS — IMO0002 Reserved for concepts with insufficient information to code with codable children: Secondary | ICD-10-CM

## 2013-08-22 ENCOUNTER — Encounter: Payer: Self-pay | Admitting: Family

## 2013-08-24 ENCOUNTER — Other Ambulatory Visit: Payer: Self-pay | Admitting: Family

## 2013-08-24 ENCOUNTER — Encounter: Payer: Self-pay | Admitting: Family

## 2013-08-24 MED ORDER — PREDNISONE 20 MG PO TABS: ORAL_TABLET | ORAL | Status: AC

## 2013-08-25 ENCOUNTER — Encounter: Payer: Self-pay | Admitting: Family

## 2013-08-25 ENCOUNTER — Other Ambulatory Visit: Payer: Self-pay | Admitting: Family

## 2013-08-25 MED ORDER — OXYCODONE-ACETAMINOPHEN 5-325 MG PO TABS: 0.5000 | ORAL_TABLET | Freq: Three times a day (TID) | ORAL | Status: DC | PRN

## 2013-09-14 ENCOUNTER — Encounter: Payer: Self-pay | Admitting: Family

## 2013-09-14 MED ORDER — OXYCODONE-ACETAMINOPHEN 5-325 MG PO TABS
0.5000 | ORAL_TABLET | Freq: Three times a day (TID) | ORAL | Status: DC | PRN
Start: 2013-09-14 — End: 2020-02-29

## 2013-09-15 ENCOUNTER — Encounter: Payer: Self-pay | Admitting: Family

## 2013-09-16 ENCOUNTER — Other Ambulatory Visit: Payer: Self-pay | Admitting: Family

## 2013-09-16 MED ORDER — MELOXICAM 15 MG PO TABS: 15.0000 mg | ORAL_TABLET | Freq: Every day | ORAL | Status: DC

## 2013-10-18 ENCOUNTER — Encounter: Payer: Self-pay | Admitting: Family

## 2013-10-22 ENCOUNTER — Encounter: Payer: Self-pay | Admitting: Family

## 2013-11-01 ENCOUNTER — Other Ambulatory Visit: Payer: BC Managed Care – PPO

## 2013-11-22 ENCOUNTER — Other Ambulatory Visit: Payer: Self-pay | Admitting: Cardiology

## 2013-12-27 ENCOUNTER — Ambulatory Visit (INDEPENDENT_AMBULATORY_CARE_PROVIDER_SITE_OTHER): Payer: BC Managed Care – PPO | Admitting: Internal Medicine

## 2013-12-27 ENCOUNTER — Encounter: Payer: Self-pay | Admitting: Internal Medicine

## 2013-12-27 ENCOUNTER — Ambulatory Visit (INDEPENDENT_AMBULATORY_CARE_PROVIDER_SITE_OTHER)
Admission: RE | Admit: 2013-12-27 | Discharge: 2013-12-27 | Disposition: A | Discharge status: Home / Self Care | Payer: BC Managed Care – PPO | Source: Ambulatory Visit | Attending: Internal Medicine | Admitting: Internal Medicine

## 2013-12-27 VITALS — BP 140/80 | Temp 101.3°F | Wt 140.0 lb

## 2013-12-27 DIAGNOSIS — J4489 Other specified chronic obstructive pulmonary disease: Secondary | ICD-10-CM

## 2013-12-27 DIAGNOSIS — R509 Fever, unspecified: Secondary | ICD-10-CM

## 2013-12-27 DIAGNOSIS — J069 Acute upper respiratory infection, unspecified: Secondary | ICD-10-CM

## 2013-12-27 DIAGNOSIS — B9789 Other viral agents as the cause of diseases classified elsewhere: Secondary | ICD-10-CM

## 2013-12-27 DIAGNOSIS — J449 Chronic obstructive pulmonary disease, unspecified: Secondary | ICD-10-CM

## 2013-12-27 DIAGNOSIS — Z85118 Personal history of other malignant neoplasm of bronchus and lung: Secondary | ICD-10-CM

## 2013-12-27 NOTE — Progress Notes (Signed)
Chief Complaint  Patient presents with  . Fever    Started on Sunday.  Has tried OTC Sudafed and Dayquil.  . Sore Throat  . Cough  . Nasal Congestion  . Fatigue    HPI: Patient comes in today for SDA for  new problem evaluation.here with husband . pcp NA ;1 day hx of new onset st and then cough bad  And fever  98 taking otc meds  Still sick. Chills and shakes  Also.today  No more sob past base;line. Dry cough takes inhalers; no cough meds . No tick bites rashes unusual bleeding. ROS: See pertinent positives and negatives per HPI.  To have  Ct scan  End of week. For restaging she is 5 years out from her cancer treatment. The worse part of this illness besides the body aches is her sore throat and runny nose and cough   reveiwed pulm hx  COPD with baseline class 2 dyspnea and mild basedline cough - MS genotype in Jan 2012.  - Gold stage 0 prior to Lobectomy in June 2010  - Gold stage 3 in October 2010 with exertional desaturations  - Normalized PFT Nov 2010 with spiriva and symbicort without exertional desaturations  - No exertional desaturation 09/12/2010 and march 2014  2. s/p LUL lung cancer - lobectomy June 2010. 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 new RUL nodule since july 2011.  -Stable CT Jan 2012 and March 2013, March 2014  4. Recurrent AECOPD  - sept 2011 and nov 2011 both treated as outpatient with usual abx/steroids.  - 2013: none  5. Hx of Tobacco  - quit may 2012  6. Coronary Artery Calcification  March 2014: Calcifications within the LAD and RCA coronary arteries noted.   Past Medical History  Diagnosis Date  . Depression   . Hyperlipidemia   . Lung mass     LLL  . COPD (chronic obstructive pulmonary disease)     No family history on file.  History   Social History  . Marital Status: Married    Spouse Name: N/A    Number of Children: N/A  . Years of Education:  N/A   Social History Main Topics  . Smoking status: Former Smoker -- 1.00 packs/day for 40 years    Types: Cigarettes    Quit date: 12/17/2008  . Smokeless tobacco: None  . Alcohol Use: No  . Drug Use: No  . Sexual Activity: None   Other Topics Concern  . None   Social History Narrative  . None    Outpatient Encounter Prescriptions as of 12/27/2013  Medication Sig  . albuterol (VENTOLIN HFA) 108 (90 BASE) MCG/ACT inhaler Inhale 2 puffs into the lungs every 6 (six) hours as needed for shortness of breath.  Marland Kitchen aspirin EC 81 MG tablet Take 1 tablet (81 mg total) by mouth daily.  Marland Kitchen atorvastatin (LIPITOR) 40 MG tablet TAKE 1 TABLET BY MOUTH EVERY DAY  . budesonide-formoterol (SYMBICORT) 80-4.5 MCG/ACT inhaler Inhale 2 puffs into the lungs 2 (two) times daily.  . meloxicam (MOBIC) 15 MG tablet Take 1 tablet (15 mg total) by mouth daily.  Marland Kitchen SPIRIVA HANDIHALER 18 MCG inhalation capsule INHALE CONTENTS OF ONE CAPSULE EVERY DAY VIA INHALER  . oxyCODONE-acetaminophen (ROXICET) 5-325 MG per tablet Take 0.5-1 tablets by mouth every 8 (eight) hours as needed for severe pain.  . [DISCONTINUED] ciprofloxacin (CIPRO)  500 MG tablet Take 1 tablet (500 mg total) by mouth 2 (two) times daily.    EXAM:  BP 140/80  Temp(Src) 101.3 F (38.5 C) (Oral)  Wt 140 lb (63.504 kg)  Body mass index is 24.02 kg/(m^2). Marland Kitchen GENERAL: vitals reviewed and listed above, alert, oriented, appears well hydrated and in no acute distress non toxic  Mildly ill normal resp  HEENT: atraumatic, conjunctiva  clear, no obvious abnormalities on inspection of external nose and ears OP : no lesion edema or exudate  NECK: no obvious masses on inspection palpation  LUNGS: clear to auscultation bilaterally, no wheezes, rales or rhonchi, ? If crackle right base CV: HRRR, no clubbing cyanosis or  peripheral edema nl cap refill  Abdomen:  Sof,t normal bowel sounds without hepatosplenomegaly, no guarding rebound or masses no CVA  tenderness Skin: normal capillary refill ,turgor , color: No acute rashes ,petechiae or bruising MS: moves all extremities without noticeable focal  abnormality PSYCH: pleasant and cooperative, no obvious depression or anxiety ASSESSMENT AND PLAN:  Discussed the following assessment and plan:  Fever, unspecified - Flulike illness appears to be viral check chest x-ray rule out pneumonia occult - Plan: Basic metabolic panel, CBC with Differential, DG Chest 2 View  Viral upper respiratory tract infection with cough - Plan: Basic metabolic panel, CBC with Differential, DG Chest 2 View  COPD - Does not appear to be active at this time continue her inhalers check chest x-ray - Plan: Basic metabolic panel, CBC with Differential, DG Chest 2 View  Hx of cancer of lung - 5 years / staging CT to be done at the end of the week - Plan: Basic metabolic panel, CBC with Differential, DG Chest 2 View  -Patient advised to return or notify health care team  if symptoms worsen ,persist or new concerns arise.  Patient Instructions  Get labs tests and chest x ray today  This acts like a viral infection but   High risk to get pneumonia so we are doing these tests.  Antibiotics ojly help bacterial infections . Will notify you  of labs when available. And decide if antibiotics may help. Monitor fever and if   persistent or progressive over the next 48 hours advise recheck.    Standley Brooking. Maecie Sevcik M.D.  Pre visit review using our clinic review tool, if applicable. No additional management support is needed unless otherwise documented below in the visit note. Total visit 13mins > 50% spent counseling and coordinating care

## 2013-12-27 NOTE — Patient Instructions (Signed)
Get labs tests and chest x ray today  This acts like a viral infection but   High risk to get pneumonia so we are doing these tests.  Antibiotics ojly help bacterial infections . Will notify you  of labs when available. And decide if antibiotics may help. Monitor fever and if   persistent or progressive over the next 48 hours advise recheck.

## 2013-12-28 ENCOUNTER — Telehealth: Payer: Self-pay | Admitting: Internal Medicine

## 2013-12-28 LAB — BASIC METABOLIC PANEL
BUN: 7 mg/dL (ref 6–23)
CHLORIDE: 104 meq/L (ref 96–112)
CO2: 29 mEq/L (ref 19–32)
Calcium: 9.3 mg/dL (ref 8.4–10.5)
Creatinine, Ser: 0.8 mg/dL (ref 0.4–1.2)
GFR: 77.8 mL/min (ref >60.00)
Glucose, Bld: 94 mg/dL (ref 70–99)
POTASSIUM: 4.2 meq/L (ref 3.5–5.1)
Sodium: 140 mEq/L (ref 135–145)

## 2013-12-28 LAB — CBC WITH DIFFERENTIAL/PLATELET
BASOS PCT: 0.2 % (ref 0.0–3.0)
Basophils Absolute: 0 10*3/uL (ref 0.0–0.1)
EOS PCT: 0.5 % (ref 0.0–5.0)
Eosinophils Absolute: 0 10*3/uL (ref 0.0–0.7)
HEMATOCRIT: 41.8 % (ref 36.0–46.0)
HEMOGLOBIN: 14.4 g/dL (ref 12.0–15.0)
LYMPHS ABS: 0.9 10*3/uL (ref 0.7–4.0)
Lymphocytes Relative: 9.4 % — ABNORMAL LOW (ref 12.0–46.0)
MCHC: 34.3 g/dL (ref 30.0–36.0)
MCV: 84.5 fl (ref 78.0–100.0)
Monocytes Absolute: 0.5 10*3/uL (ref 0.1–1.0)
Monocytes Relative: 5.3 % (ref 3.0–12.0)
Neutro Abs: 8.5 10*3/uL — ABNORMAL HIGH (ref 1.4–7.7)
Neutrophils Relative %: 84.6 % — ABNORMAL HIGH (ref 43.0–77.0)
Platelets: 262 10*3/uL (ref 150.0–400.0)
RBC: 4.95 Mil/uL (ref 3.87–5.11)
RDW: 13.8 % (ref 11.5–15.5)
WBC: 10.1 10*3/uL (ref 4.0–10.5)

## 2013-12-28 NOTE — Telephone Encounter (Signed)
Patient notified that results are not available at this time.  Will need to be reviewed by Dr. Regis Bill and I will then call her with the results.

## 2013-12-28 NOTE — Telephone Encounter (Signed)
Pt was seen by dr Regis Bill yesterday and would like blood work results

## 2013-12-30 ENCOUNTER — Other Ambulatory Visit: Payer: Self-pay | Admitting: Cardiology

## 2013-12-31 ENCOUNTER — Other Ambulatory Visit: Payer: Self-pay | Admitting: Internal Medicine

## 2013-12-31 ENCOUNTER — Telehealth: Payer: Self-pay | Admitting: Internal Medicine

## 2013-12-31 ENCOUNTER — Ambulatory Visit
Admission: RE | Admit: 2013-12-31 | Discharge: 2013-12-31 | Disposition: A | Discharge status: Home / Self Care | Payer: BC Managed Care – PPO | Source: Ambulatory Visit | Attending: Internal Medicine | Admitting: Internal Medicine

## 2013-12-31 ENCOUNTER — Other Ambulatory Visit: Payer: Self-pay | Admitting: *Deleted

## 2013-12-31 ENCOUNTER — Ambulatory Visit (HOSPITAL_COMMUNITY)
Admission: RE | Admit: 2013-12-31 | Discharge: 2013-12-31 | Disposition: A | Discharge status: Home / Self Care | Payer: BC Managed Care – PPO | Source: Ambulatory Visit | Attending: Internal Medicine | Admitting: Internal Medicine

## 2013-12-31 DIAGNOSIS — C3490 Malignant neoplasm of unspecified part of unspecified bronchus or lung: Secondary | ICD-10-CM | POA: Insufficient documentation

## 2013-12-31 MED ORDER — ATORVASTATIN CALCIUM 40 MG PO TABS: ORAL_TABLET | ORAL | Status: DC

## 2013-12-31 NOTE — Telephone Encounter (Signed)
Let [name] know that CT chest May 2015 shows no evidence of cancer recurrence and no new problems. She needs 1 year fu; so please give first available appt  Thanks'  Dr. Brand Males, M.D., [facility].C.P Pulmonary and Critical Care Medicine Staff Physician [place] [facility] Pager: [phone], If no answer or between  15:00h - 7:00h: call [phone]  12/31/2013 6:29 PM    Ct Chest Wo Contrast  12/31/2013   CLINICAL DATA:  Lung cancer.  Status post left lobectomy.  EXAM: CT CHEST WITHOUT CONTRAST  TECHNIQUE: Multidetector CT imaging of the chest was performed following the standard protocol without IV contrast.  COMPARISON:  10/26/2012.  FINDINGS: No pleural effusion identified. Postop change from left upper lobectomy identified. Stable sub solid nodule within the left upper lobe measuring 5 mm, image 18/series 5. Right apical scarring is noted. No new or enlarging pulmonary nodules or mass is identified.  The heart size is normal. No pericardial effusion. Calcified atherosclerotic disease involves the thoracic aorta as well as the LAD coronary artery. No enlarged mediastinal or hilar lymph nodes identified. There is no axillary or supraclavicular adenopathy.  Incidental imaging through the upper abdomen is significant for multiple liver cysts.  The adrenal glands both appear normal.  Review of the visualized bony structures is on unremarkable. There are no aggressive lytic or sclerotic bone lesions identified.  IMPRESSION: 1. No acute findings within the chest. No specific features identified to suggest residual or recurrence of tumor. 2. Atherosclerotic disease including LAD coronary artery calcifications. 3. Liver cysts.   Electronically Signed   By: Kerby Moors M.D.   On: 12/31/2013 17:14

## 2014-01-04 ENCOUNTER — Telehealth: Payer: Self-pay | Admitting: Internal Medicine

## 2014-01-04 NOTE — Telephone Encounter (Signed)
Brand Males, MD at 12/31/2013 6:29 PM     Status: Signed        Let [name] know that CT chest May 2015 shows no evidence of cancer recurrence and no new problems. She needs 1 year fu; so please give first available appt  Thanks'  --  I spoke with patient about results and She verbalized understanding and had no questions APPT SCHEDULED 01/25/14.

## 2014-01-04 NOTE — Telephone Encounter (Signed)
Pt has already been made aware of results. Called pt made her aware it was duplicate. Nothing further needed

## 2014-01-04 NOTE — Telephone Encounter (Signed)
Patient returning call.

## 2014-01-04 NOTE — Telephone Encounter (Signed)
lmtcb

## 2014-01-17 ENCOUNTER — Telehealth: Payer: Self-pay | Admitting: Internal Medicine

## 2014-01-17 MED ORDER — TIOTROPIUM BROMIDE MONOHYDRATE 18 MCG IN CAPS: ORAL_CAPSULE | RESPIRATORY_TRACT | Status: DC

## 2014-01-17 NOTE — Telephone Encounter (Signed)
Spoke with pt. Aware RX has been sent. Nothing further needed

## 2014-01-24 ENCOUNTER — Encounter: Payer: Self-pay | Admitting: Physician Assistant

## 2014-01-24 ENCOUNTER — Ambulatory Visit (INDEPENDENT_AMBULATORY_CARE_PROVIDER_SITE_OTHER): Payer: BC Managed Care – PPO | Admitting: Physician Assistant

## 2014-01-24 VITALS — BP 126/72 | HR 58 | Ht 63.0 in | Wt 142.0 lb

## 2014-01-24 DIAGNOSIS — E785 Hyperlipidemia, unspecified: Secondary | ICD-10-CM

## 2014-01-24 DIAGNOSIS — I2584 Coronary atherosclerosis due to calcified coronary lesion: Secondary | ICD-10-CM

## 2014-01-24 DIAGNOSIS — I2510 Atherosclerotic heart disease of native coronary artery without angina pectoris: Secondary | ICD-10-CM

## 2014-01-24 NOTE — Patient Instructions (Signed)
Your physician wants you to follow-up in: [name]. [name] You will receive a reminder letter in the mail two months in advance. If you don't receive a letter, please call our office to schedule the follow-up appointment.  Your physician recommends that you return for lab work in: [place] (LFT/LIPID)  Your physician recommends that you continue on your current medications as directed. Please refer to the Current Medication list given to you today.

## 2014-01-24 NOTE — Progress Notes (Signed)
HPI: This is a 63 year old female patient Dr. Aundra Dubin who has history of coronary disease based on chest CT in 10/2012 showing calcification in the LAD and RCA. Lexi scan Myoview on 12/08/12 was normal. EF 57%. She also has hypertension and history of COPD with prior lung cancer status post lobectomy 01/2009. She quit smoking in 2012.  Patient comes in today for yearly followup. She denies any chest pain, palpitations,  dizziness, or presyncope. She has chronic dyspnea on exertion due to to her COPD. She's had no further cardiac problems. She hasn't had blood work in a long time. She's been taking atorvastatin for a year now. She may need back surgery in the near future.   Allergies-- Codeine    --  REACTION: nausea  -- Penicillins    --  REACTION: rash  Current Outpatient Prescriptions on File Prior to Visit: albuterol (VENTOLIN HFA) 108 (90 BASE) MCG/ACT inhaler, Inhale 2 puffs into the lungs every 6 (six) hours as needed for shortness of breath., Disp: 1 Inhaler, Rfl: 3 aspirin EC 81 MG tablet, Take 1 tablet (81 mg total) by mouth daily., Disp: , Rfl:  atorvastatin (LIPITOR) 40 MG tablet, TAKE 1 TABLET BY MOUTH EVERY DAY, Disp: 30 tablet, Rfl: 0 budesonide-formoterol (SYMBICORT) 80-4.5 MCG/ACT inhaler, Inhale 2 puffs into the lungs 2 (two) times daily., Disp: 1 Inhaler, Rfl: 5 meloxicam (MOBIC) 15 MG tablet, Take 1 tablet (15 mg total) by mouth daily., Disp: 30 tablet, Rfl: 3 oxyCODONE-acetaminophen (ROXICET) 5-325 MG per tablet, Take 0.5-1 tablets by mouth every 8 (eight) hours as needed for severe pain., Disp: 60 tablet, Rfl: 0 tiotropium (SPIRIVA HANDIHALER) 18 MCG inhalation capsule, INHALE CONTENTS OF ONE CAPSULE EVERY DAY VIA INHALER, Disp: 30 capsule, Rfl: 0  No current facility-administered medications on file prior to visit.   Past Medical History:   Depression                                                   Hyperlipidemia                                               Lung mass                                                       Comment:LLL   COPD (chronic obstructive pulmonary disease)                Past Surgical History:   CHOLECYSTECTOMY                                               VESICOVAGINAL FISTULA CLOSURE W/ TAH                          LOBECTOMY  2010           Comment:Left upper  No family history on file.   Social History   Marital Status: Married             Spouse Name:                      Years of Education:                 Number of children:             Occupational History   None on file  Social History Main Topics   Smoking Status: Former Smoker                   Packs/Day: 1.00  Years: 29        Types: Cigarettes     Quit date: 12/17/2008   Smokeless Status: Not on file                      Alcohol Use: No             Drug Use: No             Sexual Activity: Not on file        Other Topics            Concern   None on file  Social History Narrative   None on file    ROS: See history of present illness otherwise negative.   PHYSICAL EXAM: Well-nournished, in no acute distress. Neck: No JVD, HJR, Bruit, or thyroid enlargement  Lungs: No tachypnea, clear without wheezing, rales, or rhonchi  Cardiovascular: RRR, PMI not displaced, positive S4 and 2/6 systolic murmur at the left sternal border, no bruit, thrill, or heave.  Abdomen: BS normal. Soft without organomegaly, masses, lesions or tenderness.  Extremities: without cyanosis, clubbing or edema. Good distal pulses bilateral  SKin: Warm, no lesions or rashes   Musculoskeletal: No deformities  Neuro: no focal signs  BP 126/72  Pulse 58  Ht 5\' 3"  (1.6 m)  Wt 142 lb (64.411 kg)  BMI 25.16 kg/m2    EKG: Sinus bradycardia 58 beats per minute otherwise normal  Overall Impression:  Normal stress nuclear study.  LV Ejection Fraction: 57%.  LV Wall Motion:  NL LV Function; NL Wall Motion  Dorris Carnes   Normal study.    Loralie Champagne 12/08/2012

## 2014-01-24 NOTE — Assessment & Plan Note (Signed)
Patient asymptomatic. Check fasting lipid panel and LFTs. Follow up with Dr. Marigene Ehlers in one year.

## 2014-01-24 NOTE — Assessment & Plan Note (Signed)
Check fasting lipid panel and LFTs 

## 2014-01-25 ENCOUNTER — Ambulatory Visit: Payer: BC Managed Care – PPO | Admitting: Internal Medicine

## 2014-01-27 ENCOUNTER — Other Ambulatory Visit: Payer: Self-pay | Admitting: Cardiology

## 2014-02-07 ENCOUNTER — Other Ambulatory Visit (INDEPENDENT_AMBULATORY_CARE_PROVIDER_SITE_OTHER): Payer: BC Managed Care – PPO

## 2014-02-07 ENCOUNTER — Encounter: Payer: Self-pay | Admitting: Physician Assistant

## 2014-02-07 ENCOUNTER — Ambulatory Visit (INDEPENDENT_AMBULATORY_CARE_PROVIDER_SITE_OTHER): Payer: BC Managed Care – PPO | Admitting: Internal Medicine

## 2014-02-07 ENCOUNTER — Telehealth: Payer: Self-pay | Admitting: Internal Medicine

## 2014-02-07 ENCOUNTER — Encounter: Payer: Self-pay | Admitting: Internal Medicine

## 2014-02-07 VITALS — BP 118/80 | HR 78 | Ht 63.0 in | Wt 143.8 lb

## 2014-02-07 DIAGNOSIS — J449 Chronic obstructive pulmonary disease, unspecified: Secondary | ICD-10-CM

## 2014-02-07 DIAGNOSIS — R911 Solitary pulmonary nodule: Secondary | ICD-10-CM

## 2014-02-07 DIAGNOSIS — C3410 Malignant neoplasm of upper lobe, unspecified bronchus or lung: Secondary | ICD-10-CM

## 2014-02-07 LAB — LIPID PANEL
CHOL/HDL RATIO: 3
Cholesterol: 187 mg/dL (ref 0–200)
HDL: 64.2 mg/dL (ref >39.00)
LDL Cholesterol: 99 mg/dL (ref 0–99)
NonHDL: 122.8
TRIGLYCERIDES: 119 mg/dL (ref 0.0–149.0)
VLDL: 23.8 mg/dL (ref 0.0–40.0)

## 2014-02-07 LAB — HEPATIC FUNCTION PANEL
ALT: 14 U/L (ref 0–35)
AST: 21 U/L (ref 0–37)
Albumin: 4.2 g/dL (ref 3.5–5.2)
Alkaline Phosphatase: 89 U/L (ref 39–117)
BILIRUBIN DIRECT: 0.1 mg/dL (ref 0.0–0.3)
Total Bilirubin: 0.9 mg/dL (ref 0.2–1.2)
Total Protein: 7.2 g/dL (ref 6.0–8.3)

## 2014-02-07 NOTE — Telephone Encounter (Signed)
Pt was seen today with the following instructions: #Rt lung nodule and left side lung cancer  - mo evidence of recurrence  next ct scan in 1 year Summer 2016 (do Low dose followup protocol  -------  Called, spoke with Rose, would like to clarify CT order -  Pt hasn't had initial low dose cancer screening scan per Rose. Would like to know if MR would like pt to have the Low dose cancer screening initial scan in Summer 2016 (this is the scan pt will need to pay $299 for), or reports they can do a Ct Chest without with low dose.  MR, pls advise on which scan you would like pt to have.  Thank you.

## 2014-02-07 NOTE — Patient Instructions (Signed)
#  COPD  - this is stable  -  continue your spiriva    -  Have flu shot in fall  #Rt lung nodule and left side lung cancer  - mo evidence of recurrence  next ct scan in 1 year  Summer 2016 (do Low dose followup protocol)  #Followup 12 months with CT chest without contrast,   call or come sooner if there are problems

## 2014-02-07 NOTE — Telephone Encounter (Signed)
I had explained this clearly in AM   she does not need the $299 low dose screening CT.  This patient already has lung cancer (cured). She needs one year followup CT chest wo contrast for nodule and cancer followup but this scan has to be done using the low dose protocol in order to cut down radiation risk.

## 2014-02-07 NOTE — Progress Notes (Signed)
Subjective:    Patient ID: [name], female    DOB: [birth year], 63 y.o.   MRN: [id number]  HPI  blem List 1. COPD with baseline class 2 dyspnea and mild basedline cough - MS genotype in Jan 2012. - Gold stage 0 prior to Lobectomy in June 2010   - Gold stage 3 in October 2010 with exertional desaturations   - Normalized PFT Nov 2010 with spiriva and symbicort without exertional desaturations    - No exertional desaturation 09/12/2010 and march 2014  2. s/p LUL lung cancer - lobectomy June 2010.  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 new RUL nodule since july 2011.  -Stable CT Jan 2012 and March 2013, March 2014  4. Recurrent AECOPD  - sept 2011 and nov 2011 both treated as outpatient with usual abx/steroids. - 2013: none  5. Hx of Tobacco  - quit may 2012  6. Coronary Artery Calcification March 2014: Calcifications within the LAD and RCA coronary arteries noted.  OV 10/30/2012  Followup for COPD. Last visit was 1 year ago. Since then no recurrence in smoking. No aecopd. Retired. Doing well. Has had pneumovax. Symptoms are minimal - copd cat score is 8. Onl issue is moderate fatigue and rare atypical chest discomfort nos that is chronic. CT chest march 2014 reviewed does not show recurrence of cancer and rul nodule is stable. There is evidence of coronary artery calcification (she has positive risk factors).  NEver seen cardiology. NEvr had stress test. She is interested in starting exericse program    OV 02/07/2014  Followup for COPD. Last visit was 1 year ago. Since then no recurrence in smoking. No aecopd. Still Retired. Doing well. Nearlyt asymptomatic.  SPirometry - totally normal whle on spiriva. Mild dyspnea on exertion relieved by rest is only symptom   Follolwup Lung cancer surveillance: CT chest 12/31/13 without evidence of cancer recurrence. Personally reviewd    Past:  Sciatica +. Contemplating back surgery+  Review of Systems  Constitutional: Negative for fever and unexpected weight change.  HENT: Negative for congestion, dental problem, ear pain, nosebleeds, postnasal drip, rhinorrhea, sinus pressure, sneezing, sore throat and trouble swallowing.   Eyes: Negative for redness and itching.  Respiratory: Positive for shortness of breath. Negative for cough, chest tightness and wheezing.   Cardiovascular: Negative for palpitations and leg swelling.  Gastrointestinal: Negative for nausea and vomiting.  Genitourinary: Negative for dysuria.  Musculoskeletal: Negative for joint swelling.  Skin: Negative for rash.  Neurological: Negative for headaches.  Hematological: Does not bruise/bleed easily.  Psychiatric/Behavioral: Negative for dysphoric mood. The patient is not nervous/anxious.        Objective:   Physical Exam  Vitals reviewed. Constitutional: She is oriented to person, place, and time. She appears well-developed and well-nourished. No distress.  HENT:  Head: Normocephalic and atraumatic.  Right Ear: External ear normal.  Left Ear: External ear normal.  Mouth/Throat: Oropharynx is clear and moist. No oropharyngeal exudate.  Eyes: Conjunctivae and EOM are normal. Pupils are equal, round, and reactive to light. Right eye exhibits no discharge. Left eye exhibits no discharge. No scleral icterus.  Neck: Normal range of motion. Neck supple. No JVD present. No tracheal deviation present. No thyromegaly present.  Cardiovascular: Normal rate, regular rhythm, normal heart sounds and intact distal pulses.  Exam reveals no gallop and no friction rub.   No murmur heard.  Pulmonary/Chest: Effort normal and breath sounds normal. No respiratory distress. She has no wheezes. She has no rales. She exhibits no tenderness.  Abdominal: Soft. Bowel sounds are normal. She exhibits no distension and no mass. There is no tenderness. There is no rebound and no guarding.    Musculoskeletal: Normal range of motion. She exhibits no edema and no tenderness.  Lymphadenopathy:    She has no cervical adenopathy.  Neurological: She is alert and oriented to person, place, and time. She has normal reflexes. No cranial nerve deficit. She exhibits normal muscle tone. Coordination normal.  Skin: Skin is warm and dry. No rash noted. She is not diaphoretic. No erythema. No pallor.  Psychiatric: She has a normal mood and affect. Her behavior is normal. Judgment and thought content normal.    Filed Vitals:   02/07/14 1045  BP: 118/80  Pulse: 78  Height: 5\' 3"  (1.6 m)  Weight: 143 lb 12.8 oz (65.227 kg)  SpO2: 98%         Assessment & Plan:  #COPD  - this is stable  -  continue your spiriva    -  Have flu shot in fall  #Rt lung nodule and left side lung cancer  - mo evidence of recurrence  next ct scan in 1 year  Summer 2016 (do Low dose followup protocol)  #Followup 12 months with CT chest without contrast,   call or come sooner if there are problems

## 2014-02-08 NOTE — Telephone Encounter (Signed)
I called made rose aware. Nothing further needed

## 2014-02-13 NOTE — Assessment & Plan Note (Signed)
#  Rt lung nodule and left side lung cancer  - mo evidence of recurrence  next ct scan in 1 year  Summer 2016 (do Low dose followup protocol)

## 2014-02-13 NOTE — Assessment & Plan Note (Signed)
#  COPD  - this is stable  -  continue your spiriva    -  Have flu shot in fall    #Followup 12 months with CT chest without contrast,   call or come sooner if there are problems

## 2014-07-21 ENCOUNTER — Encounter: Payer: Self-pay | Admitting: Internal Medicine

## 2014-09-02 ENCOUNTER — Telehealth: Payer: Self-pay | Admitting: Internal Medicine

## 2014-09-02 NOTE — Telephone Encounter (Signed)
In response 07/22/14 question ion symbicort - inhaled steroid can increase the risk of osteoporosis by a very small degreee when taken for many many years. It is NOT the main cause of osteoporosis. Having said that, I am willing to try her off symbicrt.  Just have her do spiriva alone and we can see what happens.

## 2014-09-02 NOTE — Telephone Encounter (Signed)
LMTCB

## 2014-09-05 ENCOUNTER — Ambulatory Visit (INDEPENDENT_AMBULATORY_CARE_PROVIDER_SITE_OTHER): Payer: Medicare Other | Admitting: Family Medicine

## 2014-09-05 ENCOUNTER — Encounter: Payer: Self-pay | Admitting: Family Medicine

## 2014-09-05 VITALS — BP 148/78 | Temp 98.0°F | Wt 147.0 lb

## 2014-09-05 DIAGNOSIS — M810 Age-related osteoporosis without current pathological fracture: Secondary | ICD-10-CM

## 2014-09-05 DIAGNOSIS — G8929 Other chronic pain: Secondary | ICD-10-CM | POA: Insufficient documentation

## 2014-09-05 DIAGNOSIS — M545 Low back pain, unspecified: Secondary | ICD-10-CM | POA: Insufficient documentation

## 2014-09-05 DIAGNOSIS — E785 Hyperlipidemia, unspecified: Secondary | ICD-10-CM

## 2014-09-05 DIAGNOSIS — Z23 Encounter for immunization: Secondary | ICD-10-CM

## 2014-09-05 NOTE — Assessment & Plan Note (Signed)
continue Atorvastatin 40mg  with LDL <100 within 6 months. History coronary artery calcification on imaging. Continue aspirin as well.

## 2014-09-05 NOTE — Patient Instructions (Addendum)
Sign release of information at the front desk for Dr. Carloyn Manner of bone density scan and most recent office visit.   Bring [place] a copy of what you got at [facility]. We need to update your pneumonia vaccine status.   Tdap today  Handout for mammogram  Keep an eye on blood pressure and if above 140/90 then come see [place].   See [place] in 6 months, if get morning appointment we will update your bloodwork

## 2014-09-05 NOTE — Progress Notes (Signed)
Brittney Reddish, MD Phone: [phone]  Subjective:  Patient presents today to establish care with me as their new primary care provider. Patient was formerly a patient of Dr. Leanne Chang. Chief complaint-noted.   Hyperlipidemia-controlled  Lab Results  Component Value Date   LDLCALC 99 02/07/2014   On statin: atorvastatin 40mg  Regular exercise: admits could be more active ROS- no chest pain. No myalgias. Shortness of breath due to COPD  Osteoporosis-  new Recent diagnosis by Dr. Carloyn Manner of presumed ortho. Taking calcium/vit D. Reclast injection x 1. No records available. Did not tolerate reclast injection-"all the side effects. She stopped taking her symbicort due to concerns of inhaled steroids.  ROS- no recent fractures  The following were reviewed and entered/updated in epic: Past Medical History  Diagnosis Date  . Depression     previous rx, not in years  . Hyperlipidemia   . Lung cancer     LLL- lobectomy  . COPD (chronic obstructive pulmonary disease)    Patient Active Problem List   Diagnosis Date Noted  . Osteoporosis 09/05/2014    Priority: Medium  . Chronic low back pain 09/05/2014    Priority: Medium  . COPD, frequent exacerbations 03/29/2011    Priority: Medium  . COPD (chronic obstructive pulmonary disease) 06/06/2009    Priority: Medium  . CARCINOMA, LUNG, UPPER LOBE 01/05/2009    Priority: Medium  . Hyperlipidemia 06/29/2006    Priority: Medium  . Coronary artery calcification 10/31/2012    Priority: Low  . Pulmonary nodule, right 03/29/2011    Priority: Low   Past Surgical History  Procedure Laterality Date  . Cholecystectomy    . Lung cancer surgery  2010    Left upper  . Vaginal hysterectomy      including ovaries and fallopian tubes    Family History  Problem Relation Age of Onset  . Hypertension Father   . Epilepsy Mother     hurt at birth, birth trauma reltaed  . CVA Father     hemorrhagic    Medications- reviewed and updated Current  Outpatient Prescriptions  Medication Sig Dispense Refill  . aspirin EC 81 MG tablet Take 1 tablet (81 mg total) by mouth daily.    Marland Kitchen atorvastatin (LIPITOR) 40 MG tablet TAKE 1 TABLET BY MOUTH EVERY DAY 30 tablet 5  . budesonide-formoterol (SYMBICORT) 80-4.5 MCG/ACT inhaler Inhale 2 puffs into the lungs 2 (two) times daily. 1 Inhaler 5  . meloxicam (MOBIC) 15 MG tablet Take 1 tablet (15 mg total) by mouth daily. 30 tablet 3  . tiotropium (SPIRIVA HANDIHALER) 18 MCG inhalation capsule INHALE CONTENTS OF ONE CAPSULE EVERY DAY VIA INHALER 30 capsule 0  . albuterol (VENTOLIN HFA) 108 (90 BASE) MCG/ACT inhaler Inhale 2 puffs into the lungs every 6 (six) hours as needed for shortness of breath. (Patient not taking: Reported on 09/05/2014) 1 Inhaler 3  . oxyCODONE-acetaminophen (ROXICET) 5-325 MG per tablet Take 0.5-1 tablets by mouth every 8 (eight) hours as needed for severe pain. (Patient not taking: Reported on 09/05/2014) 60 tablet 0   Allergies-reviewed and updated Allergies  Allergen Reactions  . Codeine     REACTION: nausea  . Penicillins     REACTION: rash    History   Social History  . Marital Status: Married    Spouse Name: N/A    Number of Children: N/A  . Years of Education: N/A   Social History Main Topics  . Smoking status: Former Smoker -- 1.00 packs/day for 40 years  Types: Cigarettes    Quit date: 12/17/2008  . Smokeless tobacco: None  . Alcohol Use: No  . Drug Use: No  . Sexual Activity: None   Other Topics Concern  . None   Social History Narrative   Married (husband Truman Hayward also Dr. Yong Channel patient) 1987. 3 children. 3 grandchildren.       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       Hobbies: shopping (husband will go with her) for grandkids.     ROS--See HPI   Objective: BP 148/78 mmHg  Temp(Src) 98 F (36.7 C)  Wt 147 lb (66.679 kg) Gen: NAD, resting comfortably, appears older than stated age CV: RRR no murmurs  rubs or gallops Lungs: CTAB no crackles, wheeze, rhonchi Abdomen: soft/nontender/nondistended/normal bowel sounds. Ext: no edema, 2+ PT pulses Skin: warm, dry Neuro: grossly normal, moves all extremities   Assessment/Plan:  Hyperlipidemia continue Atorvastatin 40mg  with LDL <100 within 6 months. History coronary artery calcification on imaging. Continue aspirin as well.    Osteoporosis Did not tolerate reclast. Taking Calcium and vit D. I asked patient to sign ROI so we could have copy of DEXA. Discussed we could discuss her options at 1 year anniversary of reclast. We mentioned options such as fosamax in office today. Discused inhaled steroids were likely very minor contributor and not primary cause of osteoporosis-reviewed message from Dr. Chase Caller about stopping symbicort which she had not received.     Return precautions advised. May have [facility]ovax at [facility]-would still need prevnar.   >50% of 30 minute office visit was spent on counseling(Able to convince patient to get mammogram for first time in greater than 5 years. Long discussion about colonoscopy but patient declines and asks me not to push her on this) and coordination of care (reviewing Dr. Golden Pop phone notes for patient)  Orders Placed This Encounter  Procedures  . Tdap vaccine greater than or equal to 7yo IM

## 2014-09-05 NOTE — Assessment & Plan Note (Addendum)
Did not tolerate reclast. Taking Calcium and vit D. I asked patient to sign ROI so we could have copy of DEXA. Discussed we could discuss her options at 1 year anniversary of reclast. We mentioned options such as fosamax in office today. Discused inhaled steroids were likely very minor contributor and not primary cause of osteoporosis-reviewed message from Dr. Chase Caller about stopping symbicort which she had not received.

## 2014-09-06 NOTE — Telephone Encounter (Signed)
Pt returned call from 1/15 gave her MR's response and she was ok with that.Hillery Hunter

## 2014-09-07 ENCOUNTER — Encounter: Payer: Self-pay | Admitting: Family Medicine

## 2014-09-19 ENCOUNTER — Encounter: Payer: Self-pay | Admitting: Internal Medicine

## 2014-09-20 ENCOUNTER — Encounter: Payer: Self-pay | Admitting: Family Medicine

## 2014-09-22 ENCOUNTER — Encounter: Payer: Self-pay | Admitting: Family Medicine

## 2014-09-27 ENCOUNTER — Other Ambulatory Visit: Payer: Self-pay

## 2014-09-27 DIAGNOSIS — Z1231 Encounter for screening mammogram for malignant neoplasm of breast: Secondary | ICD-10-CM

## 2014-10-10 ENCOUNTER — Ambulatory Visit
Admission: RE | Admit: 2014-10-10 | Discharge: 2014-10-10 | Disposition: A | Discharge status: Home / Self Care | Payer: Medicare Other | Source: Ambulatory Visit

## 2014-10-10 DIAGNOSIS — Z1231 Encounter for screening mammogram for malignant neoplasm of breast: Secondary | ICD-10-CM

## 2014-10-18 ENCOUNTER — Encounter: Payer: Self-pay | Admitting: Family Medicine

## 2014-10-19 ENCOUNTER — Other Ambulatory Visit: Payer: Self-pay | Admitting: *Deleted

## 2014-10-19 MED ORDER — ATORVASTATIN CALCIUM 40 MG PO TABS: 40.0000 mg | ORAL_TABLET | Freq: Every day | ORAL | Status: DC

## 2015-02-06 ENCOUNTER — Telehealth: Payer: Self-pay | Admitting: Internal Medicine

## 2015-02-06 ENCOUNTER — Ambulatory Visit (INDEPENDENT_AMBULATORY_CARE_PROVIDER_SITE_OTHER)
Admission: RE | Admit: 2015-02-06 | Discharge: 2015-02-06 | Disposition: A | Discharge status: Home / Self Care | Payer: Medicare Other | Source: Ambulatory Visit | Attending: Internal Medicine | Admitting: Internal Medicine

## 2015-02-06 DIAGNOSIS — R911 Solitary pulmonary nodule: Secondary | ICD-10-CM | POA: Diagnosis not present

## 2015-02-06 NOTE — Telephone Encounter (Signed)
LMTCB x 1 

## 2015-02-06 NOTE — Telephone Encounter (Signed)
  Comapred to 1 year ago - CT chest is stable. No lung cancer recurrene. There is a 46m new RUL nodule that is new but at ath small size it is c/w benign  Plan = repeat ct chest wo contrast - for fu lung nodule and known  lung cancer followup (not screening) but use low dose CT protocol    Ct Chest Wo Contrast  02/06/2015   CLINICAL DATA:  Subsequent encounter for lung nodule. Status post left lower lobectomy 6 years ago for lung cancer.  EXAM: CT CHEST WITHOUT CONTRAST  TECHNIQUE: Multidetector CT imaging of the chest was performed following the standard protocol without IV contrast.  COMPARISON:  12/31/2013.  FINDINGS: Mediastinum / Lymph Nodes: There is no axillary lymphadenopathy. No mediastinal or hilar lymphadenopathy. Heart size is normal. Coronary artery calcification is noted.  Lungs / Pleura: Volume loss in the left hemi thorax is compatible with reported history of left lower lobectomy. Emphysema is noted in the upper lobes bilaterally with stable appearance of right greater than left pleural-parenchymal scarring at the apex. 3 mm right upper lobe nodule seen on image 11 series 3 not definitely visualized on the prior study. Other scattered tiny 1-2 mm pulmonary nodules in the right upper lobe are stable. Previously measured 5 mm posterior right lung nodule seen on image 19 of series 3 today is unchanged. The scarring in the anterior left lung (image 32 series 3) is stable. No new left pulmonary nodule.  MSK / Soft Tissues: Bone windows reveal no worrisome lytic or sclerotic osseous lesions.  Upper Abdomen: Scattered low-density liver lesions are stable and measure water density suggesting cysts. Gallbladder is surgically absent. No evidence for adrenal nodule or mass.  IMPRESSION: 1. 3 mm right upper lobe nodule is new in the interval. This is probably a tiny focus of infection or inflammation, but attention to this region on followup imaging recommended. 2. Otherwise stable exam.    Electronically Signed   By: EMisty StanleyM.D.   On: 02/06/2015 11:19

## 2015-02-07 NOTE — Telephone Encounter (Signed)
Spoke with pt and advised of results and MR's recommendations for repeat ct in 1 year. Pt is going to keep appt with MR since it has been 1 year since she has been seen.

## 2015-02-07 NOTE — Telephone Encounter (Signed)
Sorry my bad: repeat ct chest wo contrast low dose protocol - for fu lung nodule, hx of lung cancer, hx of smoking (put all 3 please) is in 1 year. If she is feeling well from copd and resp standpoint she can skip visit 02/08/15 and just come in 1 year.

## 2015-02-07 NOTE — Telephone Encounter (Signed)
Pt returning call for CT results.  She can be reached at [phone].

## 2015-02-07 NOTE — Telephone Encounter (Signed)
MR- Please advise when you would like for pt to have repeat chest ct and also pt has appt with you tomorrow 02/08/15.  Does she need to keep this appt or wait until after she has repeat ct?

## 2015-02-08 ENCOUNTER — Encounter: Payer: Self-pay | Admitting: Internal Medicine

## 2015-02-08 ENCOUNTER — Ambulatory Visit (INDEPENDENT_AMBULATORY_CARE_PROVIDER_SITE_OTHER): Payer: Medicare Other | Admitting: Internal Medicine

## 2015-02-08 VITALS — BP 144/90 | HR 77 | Ht 63.0 in | Wt 147.0 lb

## 2015-02-08 DIAGNOSIS — J431 Panlobular emphysema: Secondary | ICD-10-CM

## 2015-02-08 DIAGNOSIS — R911 Solitary pulmonary nodule: Secondary | ICD-10-CM | POA: Diagnosis not present

## 2015-02-08 MED ORDER — UMECLIDINIUM-VILANTEROL 62.5-25 MCG/INH IN AEPB: 1.0000 | INHALATION_SPRAY | Freq: Every day | RESPIRATORY_TRACT | Status: DC

## 2015-02-08 NOTE — Patient Instructions (Addendum)
ICD-9-CM ICD-10-CM   1. Panlobular emphysema 492.8 J43.1   2. Pulmonary nodule, right 793.11 R91.1      #COPD  - this is stable but symptoms appear a little bit worse perhaps because of only single agent therapy  - Change spiriva  To ANORO - take samples - call back to see if this helps  -  Have flu shot in fall  #Rt lung nodule and left side lung cancer  - no evidence of recurrence  next ct scan in 1 year  Summer 2017 (do Low dose followup protocol)- already ordered  #Followup - call with response to [facility] 12 months with CT chest without contrast,   call or come sooner if there are problems

## 2015-02-08 NOTE — Progress Notes (Signed)
Subjective:    Patient ID: [name], female    DOB: 09-16-1950, 65 y.o.   MRN: [id number]  HPI   OV 02/08/2015  Chief Complaint  Patient presents with  . Follow-up    Pt here after recent chest CT. Pt stated her breathing has slightly worsened d/t not tolerating the spiriva respimat. Pt requesting to be changed to the handihaler.    COPD 0 mild. EmphysmeA: : Last year given strong sprimetry reduced her to single agent Rx of spiriva. Now feels spiriva respimat os causing more symptoms. Feels lack of triple Rx is contriuting to symptoms but does not ever want to go back on ICS due to fear of ostoporosis  Lung cancer surveillance: Annual CT scan June 2016 personally visualized image.  Comapred to 1 year ago - CT chest is stable. No lung cancer recurrene. There is a 10m new RUL nodule that is new but at ath small size it is c/w benign    CAT COPD Symptom & Quality of Life Score (GSK trademark) 0 is no burden. 5 is highest burden 02/08/2015   Never Cough -> Cough all the time 3  No phlegm in chest -> Chest is full of phlegm 0  No chest tightness -> Chest feels very tight 0  No dyspnea for 1 flight stairs/hill -> Very dyspneic for 1 flight of stairs 3  No limitations for ADL at home -> Very limited with ADL at home 0  Confident leaving home -> Not at all confident leaving home 0  Sleep soundly -> Do not sleep soundly because of lung condition 1  Lots of Energy -> No energy at all 2  TOTAL Score (max 40)  9       Current outpatient prescriptions:  .  albuterol (VENTOLIN HFA) 108 (90 BASE) MCG/ACT inhaler, Inhale 2 puffs into the lungs every 6 (six) hours as needed for shortness of breath., Disp: 1 Inhaler, Rfl: 3 .  aspirin EC 81 MG tablet, Take 1 tablet (81 mg total) by mouth daily., Disp: , Rfl:  .  atorvastatin (LIPITOR) 40 MG tablet, Take 1 tablet (40 mg total) by mouth daily., Disp: 30 tablet, Rfl: 3 .  meloxicam (MOBIC) 15 MG tablet, Take 1 tablet (15 mg total) by mouth  daily., Disp: 30 tablet, Rfl: 3 .  oxyCODONE-acetaminophen (ROXICET) 5-325 MG per tablet, Take 0.5-1 tablets by mouth every 8 (eight) hours as needed for severe pain., Disp: 60 tablet, Rfl: 0 .  tiotropium (SPIRIVA HANDIHALER) 18 MCG inhalation capsule, INHALE CONTENTS OF ONE CAPSULE EVERY DAY VIA INHALER, Disp: 30 capsule, Rfl: 0    Review of Systems  Constitutional: Negative for fever and unexpected weight change.  HENT: Negative for congestion, dental problem, ear pain, nosebleeds, postnasal drip, rhinorrhea, sinus pressure, sneezing, sore throat and trouble swallowing.   Eyes: Negative for redness and itching.  Respiratory: Positive for cough and shortness of breath. Negative for chest tightness and wheezing.   Cardiovascular: Negative for palpitations and leg swelling.  Gastrointestinal: Negative for nausea and vomiting.  Genitourinary: Negative for dysuria.  Musculoskeletal: Negative for joint swelling.  Skin: Negative for rash.  Neurological: Negative for headaches.  Hematological: Does not bruise/bleed easily.  Psychiatric/Behavioral: Negative for dysphoric mood. The patient is not nervous/anxious.        Objective:   Physical Exam  Constitutional: She is oriented to person, place, and time. She appears well-developed and well-nourished. No distress.  HENT:  Head: Normocephalic and atraumatic.  Right Ear:  External ear normal.  Left Ear: External ear normal.  Mouth/Throat: Oropharynx is clear and moist. No oropharyngeal exudate.  Eyes: Conjunctivae and EOM are normal. Pupils are equal, round, and reactive to light. Right eye exhibits no discharge. Left eye exhibits no discharge. No scleral icterus.  Neck: Normal range of motion. Neck supple. No JVD present. No tracheal deviation present. No thyromegaly present.  Cardiovascular: Normal rate, regular rhythm, normal heart sounds and intact distal pulses.  Exam reveals no gallop and no friction rub.   No murmur  heard. Pulmonary/Chest: Effort normal and breath sounds normal. No respiratory distress. She has no wheezes. She has no rales. She exhibits no tenderness.  Abdominal: Soft. Bowel sounds are normal. She exhibits no distension and no mass. There is no tenderness. There is no rebound and no guarding.  Musculoskeletal: Normal range of motion. She exhibits no edema or tenderness.  Lymphadenopathy:    She has no cervical adenopathy.  Neurological: She is alert and oriented to person, place, and time. She has normal reflexes. No cranial nerve deficit. She exhibits normal muscle tone. Coordination normal.  Skin: Skin is warm and dry. No rash noted. She is not diaphoretic. No erythema. No pallor.  Psychiatric: She has a normal mood and affect. Her behavior is normal. Judgment and thought content normal.  Vitals reviewed.    Filed Vitals:   02/08/15 1050  BP: 144/90  Pulse: 77  Height: '5\' 3"'$  (1.6 m)  Weight: 147 lb (66.679 kg)  SpO2: 96%        Assessment & Plan:     ICD-9-CM ICD-10-CM   1. Panlobular emphysema 492.8 J43.1   2. Pulmonary nodule, right 793.11 R91.1    #    #COPD  - this is stable but symptoms appear a little bit worse perhaps because of only single agent therapy  - Change spiriva  To ANORO - take samples - call back to see if this helps  -  Have flu shot in fall  #Rt lung nodule and left side lung cancer  - no evidence of recurrence  next ct scan in 1 year  Summer 2017 (do Low dose followup protocol)- already ordered  #Followup - call with response to [facility] 12 months with CT chest without contrast,   call or come sooner if there are problems    Dr. Brand Males, M.D., [facility].C.P Pulmonary and Critical Care Medicine Staff Physician [facility] Pager: [phone], If no answer or between  15:00h - 7:00h: call [phone]  02/16/2015 9:48 PM

## 2015-02-16 DIAGNOSIS — J431 Panlobular emphysema: Secondary | ICD-10-CM | POA: Insufficient documentation

## 2015-03-13 ENCOUNTER — Encounter: Payer: Self-pay | Admitting: Family Medicine

## 2015-03-13 ENCOUNTER — Ambulatory Visit (INDEPENDENT_AMBULATORY_CARE_PROVIDER_SITE_OTHER): Payer: Medicare Other | Admitting: Family Medicine

## 2015-03-13 VITALS — BP 140/92 | HR 78 | Temp 98.2°F | Wt 148.0 lb

## 2015-03-13 DIAGNOSIS — I2584 Coronary atherosclerosis due to calcified coronary lesion: Secondary | ICD-10-CM

## 2015-03-13 DIAGNOSIS — E785 Hyperlipidemia, unspecified: Secondary | ICD-10-CM

## 2015-03-13 DIAGNOSIS — R030 Elevated blood-pressure reading, without diagnosis of hypertension: Secondary | ICD-10-CM | POA: Diagnosis not present

## 2015-03-13 DIAGNOSIS — I2510 Atherosclerotic heart disease of native coronary artery without angina pectoris: Secondary | ICD-10-CM

## 2015-03-13 DIAGNOSIS — IMO0001 Reserved for inherently not codable concepts without codable children: Secondary | ICD-10-CM

## 2015-03-13 DIAGNOSIS — Z87891 Personal history of nicotine dependence: Secondary | ICD-10-CM | POA: Insufficient documentation

## 2015-03-13 DIAGNOSIS — M545 Low back pain: Secondary | ICD-10-CM

## 2015-03-13 DIAGNOSIS — G8929 Other chronic pain: Secondary | ICD-10-CM

## 2015-03-13 LAB — LIPID PANEL
CHOL/HDL RATIO: 3
Cholesterol: 186 mg/dL (ref 0–200)
HDL: 53.4 mg/dL (ref >39.00)
LDL Cholesterol: 109 mg/dL — ABNORMAL HIGH (ref 0–99)
NonHDL: 132.6
TRIGLYCERIDES: 116 mg/dL (ref 0.0–149.0)
VLDL: 23.2 mg/dL (ref 0.0–40.0)

## 2015-03-13 LAB — COMPREHENSIVE METABOLIC PANEL
ALT: 10 U/L (ref 0–35)
AST: 17 U/L (ref 0–37)
Albumin: 4.3 g/dL (ref 3.5–5.2)
Alkaline Phosphatase: 74 U/L (ref 39–117)
BILIRUBIN TOTAL: 0.7 mg/dL (ref 0.2–1.2)
BUN: 15 mg/dL (ref 6–23)
CALCIUM: 9.5 mg/dL (ref 8.4–10.5)
CO2: 30 mEq/L (ref 19–32)
CREATININE: 0.71 mg/dL (ref 0.40–1.20)
Chloride: 107 mEq/L (ref 96–112)
GFR: 87.67 mL/min (ref >60.00)
Glucose, Bld: 93 mg/dL (ref 70–99)
Potassium: 4.7 mEq/L (ref 3.5–5.1)
SODIUM: 143 meq/L (ref 135–145)
Total Protein: 7.1 g/dL (ref 6.0–8.3)

## 2015-03-13 LAB — TSH: TSH: 0.61 u[IU]/mL (ref 0.35–4.50)

## 2015-03-13 LAB — POCT URINALYSIS DIPSTICK
Bilirubin, UA: NEGATIVE
Glucose, UA: NEGATIVE
Ketones, UA: NEGATIVE
Leukocytes, UA: NEGATIVE
Nitrite, UA: NEGATIVE
PROTEIN UA: NEGATIVE
RBC UA: NEGATIVE
Spec Grav, UA: 1.02
Urobilinogen, UA: 0.2
pH, UA: 5.5

## 2015-03-13 LAB — CBC
HEMATOCRIT: 42.7 % (ref 36.0–46.0)
Hemoglobin: 14.5 g/dL (ref 12.0–15.0)
MCHC: 34 g/dL (ref 30.0–36.0)
MCV: 83.2 fl (ref 78.0–100.0)
Platelets: 304 10*3/uL (ref 150.0–400.0)
RBC: 5.13 Mil/uL — AB (ref 3.87–5.11)
RDW: 13.6 % (ref 11.5–15.5)
WBC: 7.1 10*3/uL (ref 4.0–10.5)

## 2015-03-13 MED ORDER — ATORVASTATIN CALCIUM 40 MG PO TABS: 40.0000 mg | ORAL_TABLET | Freq: Every day | ORAL | Status: DC

## 2015-03-13 NOTE — Assessment & Plan Note (Signed)
S: has done well recently on mobic 15 mg daily. Very sparing use of percocet per Dr. Carloyn Manner A/P: discussed with coronary calcification, minimizing nsaids ideal. Advised try 1/2 mobic as long as not increasing narcotic usage.

## 2015-03-13 NOTE — Assessment & Plan Note (Signed)
S:CAD- based on coronary calcification on CT. ASA statin. Asymptomatic (does have SOB but improved on anoro) A/P: continue asa, statin and check lipids.

## 2015-03-13 NOTE — Assessment & Plan Note (Signed)
S: good control at home, mild poor control in office BP Readings from Last 3 Encounters:  03/13/15 140/92  02/08/15 144/90  09/05/14 148/78   Home BP monitoring-132/80 this AM A/P: Patient is to bring her home cuff next visit to verify accuracy. This may be white coat hypertension- appears home monitoring through Dr. Aundra Dubin has shown reasonable control per phone call in past.

## 2015-03-13 NOTE — Progress Notes (Signed)
Garret Reddish, MD  Subjective:  [name] is a 64 y.o. year old very pleasant female patient who presents with:  See problem oriented charting ROS-Denies any CP, HA, blurry vision, LE edema, transient weakness, orthopnea, PND. No leg weakness, fecal or urinary incontinence.   Past Medical History- history lung cancer followe dby pulm, chornic low back pain followed by Dr. Carloyn Manner, COPD followed by pulm, osteoporosis  Medications- reviewed and updated Current Outpatient Prescriptions  Medication Sig Dispense Refill  . aspirin EC 81 MG tablet Take 1 tablet (81 mg total) by mouth daily.    Marland Kitchen atorvastatin (LIPITOR) 40 MG tablet Take 1 tablet (40 mg total) by mouth daily. 30 tablet 3  . meloxicam (MOBIC) 15 MG tablet Take 1 tablet (15 mg total) by mouth daily. 30 tablet 3  . tiotropium (SPIRIVA HANDIHALER) 18 MCG inhalation capsule INHALE CONTENTS OF ONE CAPSULE EVERY DAY VIA INHALER 30 capsule 0  . Umeclidinium-Vilanterol 62.5-25 MCG/INH AEPB Inhale 1 puff into the lungs daily. 60 each 5  . albuterol (VENTOLIN HFA) 108 (90 BASE) MCG/ACT inhaler Inhale 2 puffs into the lungs every 6 (six) hours as needed for shortness of breath. (Patient not taking: Reported on 03/13/2015) 1 Inhaler 3  . oxyCODONE-acetaminophen (ROXICET) 5-325 MG per tablet Take 0.5-1 tablets by mouth every 8 (eight) hours as needed for severe pain. (Patient not taking: Reported on 03/13/2015) 60 tablet 0   Objective: BP 140/92 mmHg  Pulse 78  Temp(Src) 98.2 F (36.8 C)  Wt 148 lb (67.132 kg) Gen: NAD, resting comfortably CV: RRR no murmurs rubs or gallops Lungs: CTAB no crackles, wheeze, rhonchi Abdomen: soft/nontender/nondistended/normal bowel sounds. No rebound or guarding.  Ext: no edema Skin: warm, dry, no rash Neuro: grossly normal, moves all extremities   Assessment/Plan:  Coronary artery calcification S:CAD- based on coronary calcification on CT. ASA statin. Asymptomatic (does have SOB but improved on  anoro) A/P: continue asa, statin and check lipids.    Elevated blood pressure S: good control at home, mild poor control in office BP Readings from Last 3 Encounters:  03/13/15 140/92  02/08/15 144/90  09/05/14 148/78   Home BP monitoring-132/80 this AM A/P: Patient is to bring her home cuff next visit to verify accuracy. This may be white coat hypertension- appears home monitoring through Dr. Aundra Dubin has shown reasonable control per phone call in past.    Hyperlipidemia S: controlled in past on atorvastatin with LDL <100, compliant with meds Lab Results  Component Value Date   CHOL 187 02/07/2014   HDL 64.20 02/07/2014   LDLCALC 99 02/07/2014   LDLDIRECT 222.7 11/24/2012   TRIG 119.0 02/07/2014   CHOLHDL 3 02/07/2014  A/P: repeat lipids today   Chronic low back pain S: has done well recently on mobic 15 mg daily. Very sparing use of percocet per Dr. Carloyn Manner A/P: discussed with coronary calcification, minimizing nsaids ideal. Advised try 1/2 mobic as long as not increasing narcotic usage.     Gently ask about colonoscopy each visit.  Return precautions advised.   Orders Placed This Encounter  Procedures  . CBC    [place]  . Comprehensive metabolic panel    [place]    Order Specific Question:  Has the patient fasted?    Answer:  No  . Lipid panel    [place]    Order Specific Question:  Has the patient fasted?    Answer:  No  . TSH    [place]  . POCT urinalysis dipstick  Meds ordered this encounter  Medications  . atorvastatin (LIPITOR) 40 MG tablet    Sig: Take 1 tablet (40 mg total) by mouth daily.    Dispense:  30 tablet    Refill:  6

## 2015-03-13 NOTE — Assessment & Plan Note (Signed)
S: controlled in past on atorvastatin with LDL <100, compliant with meds Lab Results  Component Value Date   CHOL 187 02/07/2014   HDL 64.20 02/07/2014   LDLCALC 99 02/07/2014   LDLDIRECT 222.7 11/24/2012   TRIG 119.0 02/07/2014   CHOLHDL 3 02/07/2014  A/P: repeat lipids today

## 2015-03-13 NOTE — Patient Instructions (Addendum)
Bring home cuff with you next visit and we will compare. Glad #s look good at home and goal continues to be <140/90 and continues to be elevated in office.   Fasting labs before you leave.   No changes to medicine at this time.

## 2015-09-18 ENCOUNTER — Other Ambulatory Visit: Payer: Self-pay | Admitting: Family Medicine

## 2015-09-18 ENCOUNTER — Encounter: Payer: Self-pay | Admitting: Family Medicine

## 2015-09-18 ENCOUNTER — Ambulatory Visit (INDEPENDENT_AMBULATORY_CARE_PROVIDER_SITE_OTHER): Payer: Medicare Other | Admitting: Family Medicine

## 2015-09-18 ENCOUNTER — Other Ambulatory Visit: Payer: Self-pay

## 2015-09-18 VITALS — BP 118/80 | HR 76 | Temp 98.5°F | Wt 146.0 lb

## 2015-09-18 DIAGNOSIS — Z20828 Contact with and (suspected) exposure to other viral communicable diseases: Secondary | ICD-10-CM | POA: Diagnosis not present

## 2015-09-18 DIAGNOSIS — E785 Hyperlipidemia, unspecified: Secondary | ICD-10-CM | POA: Diagnosis not present

## 2015-09-18 DIAGNOSIS — Z0000 Encounter for general adult medical examination without abnormal findings: Secondary | ICD-10-CM | POA: Diagnosis not present

## 2015-09-18 DIAGNOSIS — I2584 Coronary atherosclerosis due to calcified coronary lesion: Secondary | ICD-10-CM

## 2015-09-18 DIAGNOSIS — Z1211 Encounter for screening for malignant neoplasm of colon: Secondary | ICD-10-CM

## 2015-09-18 DIAGNOSIS — I2510 Atherosclerotic heart disease of native coronary artery without angina pectoris: Secondary | ICD-10-CM

## 2015-09-18 LAB — COMPREHENSIVE METABOLIC PANEL
ALBUMIN: 4.4 g/dL (ref 3.5–5.2)
ALT: 8 U/L (ref 0–35)
AST: 15 U/L (ref 0–37)
Alkaline Phosphatase: 70 U/L (ref 39–117)
BILIRUBIN TOTAL: 0.6 mg/dL (ref 0.2–1.2)
BUN: 12 mg/dL (ref 6–23)
CALCIUM: 9.6 mg/dL (ref 8.4–10.5)
CO2: 25 mEq/L (ref 19–32)
CREATININE: 0.69 mg/dL (ref 0.40–1.20)
Chloride: 105 mEq/L (ref 96–112)
GFR: 90.46 mL/min (ref >60.00)
Glucose, Bld: 107 mg/dL — ABNORMAL HIGH (ref 70–99)
Potassium: 5.2 mEq/L — ABNORMAL HIGH (ref 3.5–5.1)
SODIUM: 142 meq/L (ref 135–145)
Total Protein: 7.1 g/dL (ref 6.0–8.3)

## 2015-09-18 LAB — LDL CHOLESTEROL, DIRECT: Direct LDL: 115 mg/dL

## 2015-09-18 NOTE — Progress Notes (Signed)
Brittney Reddish, MD Phone: [phone]  Subjective:  Patient presents today for their annual wellness visit.    Preventive Screening-Counseling & Management  Smoking Status: former Smoker. Quit 2010.  Second Hand Smoking status: No smokers in home  Risk Factors Regular exercise: at least once a week- aerobic program online Diet: sugar is still an issues  Wt Readings from Last 3 Encounters:  09/18/15 146 lb (66.225 kg)  03/13/15 148 lb (67.132 kg)  02/08/15 147 lb (66.679 kg)  Fall Risk: None   Cardiac risk factors:  advanced age (older than 53 for men, 80 for women)  Hyperlipidemia - reasonable control, repeating today No diabetes.  Family History: denies   Depression Screen None. PHQ2 0   Activities of Daily Living Independent ADLs and IADLs   Hearing Difficulties: -patient declines  Cognitive Testing No reported trouble.   Normal 3 word recall  List the Names of Other Physician/Practitioners you currently use: -Dr. Tarri Glenn dermatology -Dr. Chase Caller pulmonary  Immunization History  Administered Date(s) Administered  . Influenza Split 05/20/2012  . Influenza Whole 06/06/2009, 04/15/2012  . Influenza-Unspecified 06/15/2013, 08/09/2014, 07/21/2015  . Pneumococcal Conjugate-13 09/22/2014  . Tdap 09/05/2014   Required Immunizations needed today : none, up to date  Screening tests- up to date Health Maintenance Due  Topic Date Due  . Hepatitis C Screening - today [birth year]  . COLONOSCOPY - stool cards, perhaps colonoscopy next year 09/04/1999   ROS- No pertinent positives discovered in course of AWV.  Problem oriented- Has not had any recent shortness of breath unless really really cold- rare albuterol use. No chest pain. Intermittent back pain rare oxycodone 1/2 tab.   The following were reviewed and entered/updated in epic: Past Medical History  Diagnosis Date  . Depression     previous rx, not in years  . Hyperlipidemia   . Lung cancer  ([place])     LLL- lobectomy  . COPD (chronic obstructive pulmonary disease) [facility])    Patient Active Problem List   Diagnosis Date Noted  . Coronary artery calcification 10/31/2012    Priority: High  . Elevated blood pressure 03/13/2015    Priority: Medium  . Osteoporosis 09/05/2014    Priority: Medium  . Chronic low back pain 09/05/2014    Priority: Medium  . COPD, frequent exacerbations ([place]) 03/29/2011    Priority: Medium  . COPD (chronic obstructive pulmonary disease) ([place]) 06/06/2009    Priority: Medium  . CARCINOMA, LUNG, UPPER LOBE 01/05/2009    Priority: Medium  . Hyperlipidemia 06/29/2006    Priority: Medium  . Former smoker 03/13/2015    Priority: Low  . Panlobular emphysema ([place]) 02/16/2015    Priority: Low  . Pulmonary nodule, right 03/29/2011    Priority: Low   Past Surgical History  Procedure Laterality Date  . Cholecystectomy    . Lung cancer surgery  2010    Left upper  . Vaginal hysterectomy      including ovaries and fallopian tubes    Family History  Problem Relation Age of Onset  . Hypertension Father   . Epilepsy Mother     hurt at birth, birth trauma reltaed  . CVA Father     hemorrhagic    Medications- reviewed and updated Current Outpatient Prescriptions  Medication Sig Dispense Refill  . aspirin EC 81 MG tablet Take 1 tablet (81 mg total) by mouth daily.    Marland Kitchen atorvastatin (LIPITOR) 40 MG tablet Take 1 tablet (40 mg total) by mouth daily. 30 tablet 6  .  meloxicam (MOBIC) 15 MG tablet Take 1 tablet (15 mg total) by mouth daily. 30 tablet 3  . albuterol (VENTOLIN HFA) 108 (90 BASE) MCG/ACT inhaler Inhale 2 puffs into the lungs every 6 (six) hours as needed for shortness of breath. (Patient not taking: Reported on 03/13/2015) 1 Inhaler 3  . oxyCODONE-acetaminophen (ROXICET) 5-325 MG per tablet Take 0.5-1 tablets by mouth every 8 (eight) hours as needed for severe pain. (Patient not taking: Reported on 03/13/2015) 60 tablet 0   Allergies-reviewed  and updated Allergies  Allergen Reactions  . Codeine     REACTION: nausea  . Penicillins     REACTION: rash    Social History   Social History  . Marital Status: Married    Spouse Name: N/A  . Number of Children: N/A  . Years of Education: N/A   Social History Main Topics  . Smoking status: Former Smoker -- 1.00 packs/day for 40 years    Types: Cigarettes    Quit date: 12/17/2008  . Smokeless tobacco: None  . Alcohol Use: No  . Drug Use: No  . Sexual Activity: Not Asked   Other Topics Concern  . None   Social History Narrative   Married (husband Truman Hayward also Dr. Yong Channel patient) 1987. 3 children. 3 grandchildren.       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       Hobbies: shopping (husband will go with her) for grandkids.     Objective: BP 118/80 mmHg  Pulse 76  Temp(Src) 98.5 F (36.9 C)  Wt 146 lb (66.225 kg) Gen: NAD, resting comfortably HEENT: Mucous membranes are moist. Oropharynx normal Neck: no thyromegaly CV: RRR no murmurs rubs or gallops Lungs: CTAB no crackles, wheeze, rhonchi Abdomen: soft/nontender/nondistended/normal bowel sounds. Ext: no edema 2+ DP pulses Skin: warm, dry, no rash Neuro: grossly normal, moves all extremities, PERRLA  Assessment/Plan:  AWV completed  CAD based off of CT- last LDL >100. Also Hyperlipidemia. would at minimum want over 100. Already on high intensity statin and hesitant to increase further unless over 100. Not seeing Dr. Aundra Dubin. Asymptomatic. On aspirin, statin.   Osteoporosis- encouraged to follow up with Dr. Carloyn Manner  Elevated blood pressure in past- controlled today  Follows with pulmonary for lung disease and pulomnoary nodule follow up  Chronic low back pain from herniated disc- mobic and percocet per Dr. Carloyn Manner -very sparing use of percocet with #60 last given in 2015  Return in about 1 year (around 09/17/2016) for annual wellness visit. can do CPE 6 months- would need  breast exam. Declines colononoscopy for now- willing to do stool cards- readdress next visit.  Return precautions advised.   fasting Orders Placed This Encounter  Procedures  . Hepatitis C antibody, reflex    [facility]  . LDL cholesterol, direct    [place]  . Comprehensive metabolic panel    [place]  . POC Hemoccult Bld/Stl (3-Cd Home Screen)    Send home    Standing Status: Future     Number of Occurrences:      Standing Expiration Date: 09/17/2016

## 2015-09-18 NOTE — Patient Instructions (Addendum)
  [name] , Thank you for taking time to come for your Medicare Wellness Visit. I appreciate your ongoing commitment to your health goals. Please review the following plan we discussed and let me know if I can assist you in the future.   These are the goals we discussed: 1. Update fasting labs before you leave 2. Try to get exercise up to 3x a week. Great job losing 2 lbs through the holidays!  3. Bring back stool cards- you are going to consider colonoscopy over next year. Thank you so much for doing your mammogram last year!    This is a list of the screening recommended for you and due dates:  Health Maintenance  Topic Date Due  .  Hepatitis C: One time screening is recommended by Center for Disease Control  (CDC) for  adults born from [birth year] through 1966.   02/13/1951  . Colon Cancer Screening  09/04/1999  . Shingles Vaccine  09/22/2018*  . Pneumonia vaccines (2 of 2 - PPSV23) 09/23/2015  . Flu Shot  03/19/2016  . Mammogram  10/10/2016  . Tetanus Vaccine  09/05/2024  . DEXA scan (bone density measurement)  Completed  *Topic was postponed. The date shown is not the original due date.

## 2015-09-19 LAB — HEPATITIS C ANTIBODY: HCV AB: NEGATIVE

## 2015-09-21 ENCOUNTER — Encounter: Payer: Self-pay | Admitting: Family Medicine

## 2015-11-03 ENCOUNTER — Other Ambulatory Visit: Payer: Self-pay | Admitting: Internal Medicine

## 2015-11-07 ENCOUNTER — Telehealth: Payer: Self-pay | Admitting: Internal Medicine

## 2015-11-07 MED ORDER — UMECLIDINIUM-VILANTEROL 62.5-25 MCG/INH IN AEPB: 1.0000 | INHALATION_SPRAY | Freq: Every day | RESPIRATORY_TRACT | Status: DC

## 2015-11-07 NOTE — Telephone Encounter (Signed)
lmtcb for pt.  

## 2015-11-07 NOTE — Telephone Encounter (Signed)
Called and spoke to pt. Pt requested a refill of Anoro. Rx sent to preferred pharmacy. Pt verbalized understanding and denied any further questions or concerns at this time.

## 2015-11-07 NOTE — Telephone Encounter (Signed)
Pt returning call.[name] ° °

## 2015-11-10 LAB — FECAL OCCULT BLOOD, GUAIAC: Fecal Occult Blood: NEGATIVE

## 2016-01-16 ENCOUNTER — Telehealth: Payer: Self-pay

## 2016-01-16 NOTE — Telephone Encounter (Signed)
Had visit 09/18/15 for awv it appears.

## 2016-01-16 NOTE — Telephone Encounter (Signed)
Pt did. I am so sorry I missed that.

## 2016-01-16 NOTE — Telephone Encounter (Signed)
Patient is on the list for [employer] 2017 and may be a good candidate for an AWV in 2017.  PS. It doesn't look patient has ever had an AWV so this may need to be done by PCP.

## 2016-01-17 ENCOUNTER — Other Ambulatory Visit: Payer: Self-pay

## 2016-01-17 ENCOUNTER — Telehealth: Payer: Self-pay | Admitting: Family Medicine

## 2016-01-17 MED ORDER — ATORVASTATIN CALCIUM 40 MG PO TABS: 40.0000 mg | ORAL_TABLET | Freq: Every day | ORAL | Status: DC

## 2016-01-17 NOTE — Telephone Encounter (Signed)
Filled

## 2016-01-17 NOTE — Telephone Encounter (Signed)
Pt needs new rx atorvastatin 40 mg #90 w/refills. [facility] [place]

## 2016-01-25 ENCOUNTER — Telehealth: Payer: Self-pay

## 2016-01-25 NOTE — Telephone Encounter (Signed)
AWV already billed in Jan 2017

## 2016-01-30 ENCOUNTER — Encounter: Payer: Self-pay | Admitting: Family Medicine

## 2016-02-06 ENCOUNTER — Ambulatory Visit (INDEPENDENT_AMBULATORY_CARE_PROVIDER_SITE_OTHER)
Admission: RE | Admit: 2016-02-06 | Discharge: 2016-02-06 | Disposition: A | Discharge status: Home / Self Care | Payer: Medicare Other | Source: Ambulatory Visit | Attending: Internal Medicine | Admitting: Internal Medicine

## 2016-02-06 DIAGNOSIS — R911 Solitary pulmonary nodule: Secondary | ICD-10-CM

## 2016-02-12 ENCOUNTER — Telehealth: Payer: Self-pay | Admitting: Internal Medicine

## 2016-02-12 NOTE — Telephone Encounter (Signed)
Result Notes     Notes Recorded by Beckie Busing, CMA on 02/12/2016 at 10:53 AM Attempted to contact patient regarding results. Left message on voicemail for patient to return call. ------ Notes Recorded by Len Blalock, CMA on 02/08/2016 at 10:54 AM lmtcb X1 for pt to relay results/recs. ------ Notes Recorded by Brand Males, MD on 02/08/2016 at 10:52 AM No change in 1 year. Repeat surveillance CT chest wo contrast in 1 year   Spoke with pt. She is aware of results. Nothing further was needed.

## 2016-02-21 ENCOUNTER — Encounter: Payer: Self-pay | Admitting: Internal Medicine

## 2016-02-21 ENCOUNTER — Ambulatory Visit (INDEPENDENT_AMBULATORY_CARE_PROVIDER_SITE_OTHER): Payer: Medicare Other | Admitting: Internal Medicine

## 2016-02-21 VITALS — BP 128/62 | HR 77 | Ht 64.0 in | Wt 144.6 lb

## 2016-02-21 DIAGNOSIS — R911 Solitary pulmonary nodule: Secondary | ICD-10-CM | POA: Diagnosis not present

## 2016-02-21 DIAGNOSIS — Z23 Encounter for immunization: Secondary | ICD-10-CM

## 2016-02-21 DIAGNOSIS — J449 Chronic obstructive pulmonary disease, unspecified: Secondary | ICD-10-CM

## 2016-02-21 NOTE — Progress Notes (Signed)
History of Present Illness [name] is a 65 y.o. female with COPD and a history of stage 1 lung cancer treated with surgery in 2010, she is followed by Dr. Chase Caller.   02/21/2016 Annual Follow UP: Pt. Continues to do well on her Anoro once daily. She rarely uses her rescue inhaler. CAT score is less than 10. CT scan of the chest is unchanged from the 2016 scan. She denies fever, chest pain, orthopnea or hemoptysis.She will get the Pneumovax vaccine today in the office.  Tests CT Chest Nodule Follow Up 02/06/2016:  IMPRESSION: 1. Previously noted right upper lobe nodules are unchanged, and considered benign. No follow-up recommended. This recommendation follows the consensus statement: Guidelines for Management of Incidental Pulmonary Nodules Detected on CT Images:From the Fleischner Society 2017; published online before print (10.1148/[id number]). 2. Mild diffuse bronchial wall thickening with mild centrilobular and paraseptal emphysema; imaging findings suggestive of underlying COPD. 3. Atherosclerosis, including left main and 2 vessel coronary artery disease. Please note that although the presence of coronary artery calcium documents the presence of coronary artery disease, the severity of this disease and any potential stenosis cannot be assessed on this non-gated CT examination. Assessment for potential risk factor modification, dietary therapy or pharmacologic therapy   Past medical hx Past Medical History  Diagnosis Date  . Depression     previous rx, not in years  . Hyperlipidemia   . Lung cancer ([place])     LLL- lobectomy  . COPD (chronic obstructive pulmonary disease) ([facility])      Past surgical hx, Family hx, Social hx all reviewed.  Current Outpatient Prescriptions on File Prior to Visit  Medication Sig  . aspirin EC 81 MG tablet Take 1 tablet (81 mg total) by mouth daily.  Marland Kitchen atorvastatin (LIPITOR) 40 MG tablet Take 1 tablet (40 mg total) by mouth  daily.  . meloxicam (MOBIC) 15 MG tablet Take 1 tablet (15 mg total) by mouth daily.  Marland Kitchen oxyCODONE-acetaminophen (ROXICET) 5-325 MG per tablet Take 0.5-1 tablets by mouth every 8 (eight) hours as needed for severe pain.  Marland Kitchen umeclidinium-vilanterol (ANORO ELLIPTA) 62.5-25 MCG/INH AEPB Inhale 1 puff into the lungs daily.   No current facility-administered medications on file prior to visit.     Allergies  Allergen Reactions  . Codeine     REACTION: nausea  . Penicillins     REACTION: rash    Review Of Systems:  Constitutional:   No  weight loss, night sweats,  Fevers, chills, fatigue, or  lassitude.  HEENT:   No headaches,  Difficulty swallowing,  Tooth/dental problems, or  Sore throat,                No sneezing, itching, ear ache, nasal congestion, post nasal drip,   CV:  No chest pain,  Orthopnea, PND, swelling in lower extremities, anasarca, dizziness, palpitations, syncope.   GI  No heartburn, indigestion, abdominal pain, nausea, vomiting, diarrhea, change in bowel habits, loss of appetite, bloody stools.   Resp: No shortness of breath with exertion or at rest.  No excess mucus, no productive cough,  No non-productive cough,  No coughing up of blood.  No change in color of mucus.  No wheezing.  No chest wall deformity  Skin: no rash or lesions.  GU: no dysuria, change in color of urine, no urgency or frequency.  No flank pain, no hematuria   MS:  No joint pain or swelling.  No decreased range of motion.  + back  pain.  Psych:  No change in mood or affect. No depression or anxiety.  No memory loss.   Vital Signs BP 128/62 mmHg  Pulse 77  Ht '5\' 4"'$  (1.626 m)  Wt 144 lb 9.6 oz (65.59 kg)  BMI 24.81 kg/m2  SpO2 97%   Physical Exam:  General- No distress,  A&Ox3, very pleasant ENT: No sinus tenderness, TM clear, pale nasal mucosa, no oral exudate,no post nasal drip, no LAN Cardiac: S1, S2, regular rate and rhythm, no murmur Chest: No wheeze/ rales/ dullness; no accessory  muscle use, diminished per bases bilaterally, no nasal flaring, no sternal retractions Abd.: Soft Non-tender Ext: No clubbing cyanosis, edema Neuro:  normal strength Skin: No rashes, warm and dry Psych: normal mood and behavior   Assessment/Plan  COPD (chronic obstructive pulmonary disease) Stable COPD Plan: Continue Anoro Elliptica  One puff once daily. Continue using your Proventil as rescue.  We will send in a new prescription. Get flu vaccine in the fall. We will give you the pneumovax today in the office. Follow up in 1 year with Dr. Chase Caller with LDCT scan prior. Please contact office for sooner follow up if symptoms do not improve or worsen or seek emergency care    Pulmonary nodule, right Stable Pulmonary Nodule 40 pack year smoking history Greater than 7 years post diagnosis for [place] [facility] referral to Referral to Lung Cancer Screening. Follow up in 1 year with Dr. Chase Caller Please contact office for sooner follow up if symptoms do not improve or worsen or seek emergency care       Magdalen Spatz, NP 02/21/2016  5:13 PM   STAFF NOTE: I, Dr Ann Lions have personally reviewed patient's available data, including medical history, events of note, physical examination and test results as part of my evaluation. I have discussed with resident/NP and other care providers such as pharmacist, RN and RRT.  In addition,  I personally evaluated patient and elicited key findings of   S: mild emphysema/CAT< 10 fu and lung cancer surveillance fu. Annyual visit. Feels well. Walked hill without problems. Likes anoro  O: cta No thrush Looks great  Ct June 2017 - no recurrence. 7m nodule stable  A: emphysema  Lung cancer - in remission since 2010   P: pneumovax 02/21/2016 - per chart has had only prevnar ( discussed the differences and indication)  Continue anoro  Change to annula LDCT  - SEric Formto arrange   .  Rest per NP/medical resident whose  note is outlined above and that I agree with     Dr. MBrand Males M.D., FThe [facility]icare Center Of UtahC.P Pulmonary and Critical Care Medicine Staff Physician [place]ulmonary and Critical Care Pager: [phone] If no answer or between  15:00h - 7:00h: call [phone]  02/21/2016 5:30 PM

## 2016-02-21 NOTE — Patient Instructions (Addendum)
It is nice to meet you. Your CT scan of the chest is stable. Continue taking your Anoro 1 puff once daily as you have been doing. We will send in a prescription for your Proventil rescue inhaler. Remember to get your flu vaccine in the fall We will give you the pneumovax vaccine today. We will refer you for lung cancer screening with low dose CT starting June  2018. Follow up with Dr. Chase Caller in 1 year. Please contact office for sooner follow up if symptoms do not improve or worsen or seek emergency care

## 2016-02-21 NOTE — Assessment & Plan Note (Addendum)
Stable COPD Plan: Continue Anoro Elliptica  One puff once daily. Continue using your Proventil as rescue.  We will send in a new prescription. Get flu vaccine in the fall. We will give you the pneumovax today in the office. Follow up in 1 year with Dr. Chase Caller with LDCT scan prior. Please contact office for sooner follow up if symptoms do not improve or worsen or seek emergency care

## 2016-02-21 NOTE — Assessment & Plan Note (Signed)
Stable Pulmonary Nodule 40 pack year smoking history Greater than 7 years post diagnosis for [place] [facility] referral to Referral to Lung Cancer Screening. Follow up in 1 year with Dr. Chase Caller Please contact office for sooner follow up if symptoms do not improve or worsen or seek emergency care

## 2016-02-22 ENCOUNTER — Other Ambulatory Visit: Payer: Self-pay | Admitting: Acute Care

## 2016-02-22 ENCOUNTER — Telehealth: Payer: Self-pay | Admitting: Internal Medicine

## 2016-02-22 DIAGNOSIS — Z87891 Personal history of nicotine dependence: Secondary | ICD-10-CM

## 2016-02-22 MED ORDER — ALBUTEROL SULFATE HFA 108 (90 BASE) MCG/ACT IN AERS: 2.0000 | INHALATION_SPRAY | Freq: Four times a day (QID) | RESPIRATORY_TRACT | Status: DC | PRN

## 2016-02-22 NOTE — Addendum Note (Signed)
Addended by: Virl Cagey on: 02/22/2016 08:47 AM   Modules accepted: Orders

## 2016-02-22 NOTE — Telephone Encounter (Signed)
Spoke with the pt  She states proventil HFA was never sent to pharm  Rx was sent to pharm  Nothing further needed

## 2016-08-29 LAB — HM DEXA SCAN

## 2016-09-13 ENCOUNTER — Encounter: Payer: Self-pay | Admitting: Family Medicine

## 2016-09-16 ENCOUNTER — Telehealth: Payer: Self-pay | Admitting: Family Medicine

## 2016-09-16 NOTE — Telephone Encounter (Signed)
We have received the forms she is referring too. I have placed them at my desk as she is scheduled to come in on 09/23/16. Dr. Yong Channel is aware and will discuss the Prolia with her at her visit.

## 2016-09-16 NOTE — Telephone Encounter (Signed)
States that [facility] will be sending a fax for a  referral for Prolia.  Pt is coming in 09/20/16 for an unrelated appt but stated that she is wondering if her insurance will cover the Prolia.  Dr. Elta Guadeloupe Roy's NP stated it was a tier 1 an her insurance stated it is a tier 3.  She is confused.

## 2016-09-23 ENCOUNTER — Ambulatory Visit (INDEPENDENT_AMBULATORY_CARE_PROVIDER_SITE_OTHER): Payer: Medicare Other | Admitting: Family Medicine

## 2016-09-23 ENCOUNTER — Encounter: Payer: Self-pay | Admitting: Family Medicine

## 2016-09-23 DIAGNOSIS — M810 Age-related osteoporosis without current pathological fracture: Secondary | ICD-10-CM

## 2016-09-23 DIAGNOSIS — M67441 Ganglion, right hand: Secondary | ICD-10-CM | POA: Insufficient documentation

## 2016-09-23 MED ORDER — ALENDRONATE SODIUM 70 MG PO TABS: 70.0000 mg | ORAL_TABLET | ORAL | 11 refills | Status: DC

## 2016-09-23 NOTE — Patient Instructions (Signed)
I can refer to Dr. Amedeo Plenty if you want his opinion. I think this is a ganglion cyst- which can be removed especially if it starts to bother you  You have osteoporosis but fortunately improving At a minimum, recommend 800 IU of vitamin D and '1200mg'$  of Calcium per day. You can get this with a calcium-vitamin D supplement.  Once you have the above in place, I would start taking fosamax '70mg'$  once a week.  Administer first thing in the morning and >30 minutes before the first food, beverage (except plain water), or other medication of the day. Do not take with mineral water or with other beverages. Stay upright (not to lie down) for at least 30 minutes after taking medicine and until after first food of the day (to reduce irritation). Must be taken with 6 to 8 oz of plain water. The tablet should be swallowed whole; do not chew or suck.

## 2016-09-23 NOTE — Progress Notes (Signed)
Pre visit review using our clinic review tool, if applicable. No additional management support is needed unless otherwise documented below in the visit note. 

## 2016-09-23 NOTE — Progress Notes (Signed)
Subjective:  EULAR PANEK is a 66 y.o. year old very pleasant female patient who presents for/with See problem oriented charting ROS- no fever, chills, nausea. No finger pain or redness   Past Medical History-  Patient Active Problem List   Diagnosis Date Noted  . Coronary artery calcification 10/31/2012    Priority: High  . Elevated blood pressure 03/13/2015    Priority: Medium  . Osteoporosis 09/05/2014    Priority: Medium  . Chronic low back pain 09/05/2014    Priority: Medium  . COPD, frequent exacerbations ([place]) 03/29/2011    Priority: Medium  . COPD (chronic obstructive pulmonary disease) ([place]) 06/06/2009    Priority: Medium  . CARCINOMA, LUNG, UPPER LOBE 01/05/2009    Priority: Medium  . Hyperlipidemia 06/29/2006    Priority: Medium  . Ganglion cyst of finger of right hand 09/23/2016    Priority: Low  . Former smoker 03/13/2015    Priority: Low  . Panlobular emphysema ([place]) 02/16/2015    Priority: Low  . Pulmonary nodule, right 03/29/2011    Priority: Low    Medications- reviewed and updated Current Outpatient Prescriptions  Medication Sig Dispense Refill  . albuterol (PROAIR HFA) 108 (90 Base) MCG/ACT inhaler Inhale 1-2 puffs into the lungs every 6 (six) hours as needed for wheezing or shortness of breath. Reported on 02/21/2016    . albuterol (PROVENTIL HFA;VENTOLIN HFA) 108 (90 Base) MCG/ACT inhaler Inhale 2 puffs into the lungs every 6 (six) hours as needed for wheezing or shortness of breath. 1 Inhaler 2  . aspirin EC 81 MG tablet Take 1 tablet (81 mg total) by mouth daily.    Marland Kitchen atorvastatin (LIPITOR) 40 MG tablet Take 1 tablet (40 mg total) by mouth daily. 90 tablet 2  . meloxicam (MOBIC) 15 MG tablet Take 1 tablet (15 mg total) by mouth daily. 30 tablet 3  . oxyCODONE-acetaminophen (ROXICET) 5-325 MG per tablet Take 0.5-1 tablets by mouth every 8 (eight) hours as needed for severe pain. 60 tablet 0  . umeclidinium-vilanterol (ANORO ELLIPTA) 62.5-25 MCG/INH  AEPB Inhale 1 puff into the lungs daily. 60 each 11   No current facility-administered medications for this visit.     Objective: BP 128/72 (BP Location: Left Arm, Patient Position: Sitting, Cuff Size: Normal)   Pulse 79   Temp 97.8 F (36.6 C) (Oral)   Wt 147 lb 9.6 oz (67 kg)   SpO2 95%   BMI 25.34 kg/m  Gen: NAD, resting comfortably CV: RRR no murmurs rubs or gallops Lungs: CTAB no crackles, wheeze, rhonchi Ext: no edema Msk: right 3rd PIP joint- just distal to this- slightly mobile 7 x 7 mm subcutaneous firm nodule. No pain with palpation Skin: warm, dry, no rash or redness near thumb Neuro: grossly normal, moves all extremities, normal finger strength and distal sensation  Assessment/Plan:  Ganglion cyst of finger of right hand S: Has had a cyst on right 3rd finger near PIP joint. Has been there 5-6 months. Not causing pain. Slightly bigger in this time frame. No itching. 7 mm in size.   Has seen Dr. Amedeo Plenty in the past for left thumb fracture A/P: we discussed likely ganglion cyst and benign etiology. She had been scared by some horror stories she had heard and did not want to "let this get away". Already a patient of Dr. Amedeo Plenty- for worsening or pain she has agreed to follow up with him at hand surgery  Osteoporosis S: on reclast got very nauseous. Had severe pain  in her back going down into her left leg and was right after the injection. History of back pain and could not have surgery due to severity of osteoporosis. AP spine t score -3.3 in 2015 up to -2.8 in 2018. Left femur from -2.9 up to -2.5. Right femur from -2.6 up to -2.4.  A/P: Patient has seen significant improvement with reclast. Last time she had severe pain right after last injection with this though. I received a message about prolia q6 months from Dr. Carloyn Manner. I talked with patient and she has never tried oral bisphosphonate. Given she had good improvement with IV bisphosphonate- we opted to trial oral- if recurrent  pain like she had with injectable form would move to prolia as next step.  Fosamax weekly planned. Recheck bone density 2 years  Meds ordered this encounter  Medications  . alendronate (FOSAMAX) 70 MG tablet    Sig: Take 1 tablet (70 mg total) by mouth every 7 (seven) days. Take with a full glass of water on an empty stomach.    Dispense:  4 tablet    Refill:  11    Return precautions advised.  Garret Reddish, MD

## 2016-09-23 NOTE — Assessment & Plan Note (Signed)
S: on reclast got very nauseous. Had severe pain in her back going down into her left leg and was right after the injection. History of back pain and could not have surgery due to severity of osteoporosis. AP spine t score -3.3 in 2015 up to -2.8 in 2018. Left femur from -2.9 up to -2.5. Right femur from -2.6 up to -2.4.  A/P: Patient has seen significant improvement with reclast. Last time she had severe pain right after last injection with this though. I received a message about prolia q6 months from Dr. Carloyn Manner. I talked with patient and she has never tried oral bisphosphonate. Given she had good improvement with IV bisphosphonate- we opted to trial oral- if recurrent pain like she had with injectable form would move to prolia as next step.  Fosamax weekly planned. Recheck bone density 2 years

## 2016-09-23 NOTE — Assessment & Plan Note (Signed)
S: Has had a cyst on right 3rd finger near PIP joint. Has been there 5-6 months. Not causing pain. Slightly bigger in this time frame. No itching. 7 mm in size.   Has seen Dr. Amedeo Plenty in the past for left thumb fracture A/P: we discussed likely ganglion cyst and benign etiology. She had been scared by some horror stories she had heard and did not want to "let this get away". Already a patient of Dr. Amedeo Plenty- for worsening or pain she has agreed to follow up with him at hand surgery

## 2016-09-24 NOTE — Progress Notes (Signed)
Faxed office note to Dr. Glenna Fellows as requested

## 2016-11-18 ENCOUNTER — Other Ambulatory Visit: Payer: Self-pay | Admitting: Internal Medicine

## 2017-02-02 ENCOUNTER — Telehealth: Payer: Self-pay | Admitting: Critical Care Medicine

## 2017-02-02 MED ORDER — PREDNISONE 10 MG PO TABS: ORAL_TABLET | ORAL | 0 refills | Status: DC

## 2017-02-02 MED ORDER — AZITHROMYCIN 250 MG PO TABS: 250.0000 mg | ORAL_TABLET | Freq: Every day | ORAL | 0 refills | Status: DC

## 2017-02-02 NOTE — Telephone Encounter (Signed)
Pt called and wanting pred and ABX   I called pred pulse and azithromycin

## 2017-02-20 ENCOUNTER — Ambulatory Visit (INDEPENDENT_AMBULATORY_CARE_PROVIDER_SITE_OTHER): Payer: Medicare Other | Admitting: Internal Medicine

## 2017-02-20 ENCOUNTER — Other Ambulatory Visit: Payer: Self-pay | Admitting: Acute Care

## 2017-02-20 ENCOUNTER — Encounter: Payer: Self-pay | Admitting: Internal Medicine

## 2017-02-20 VITALS — BP 126/64 | HR 86 | Ht 63.0 in | Wt 146.8 lb

## 2017-02-20 DIAGNOSIS — Z85118 Personal history of other malignant neoplasm of bronchus and lung: Secondary | ICD-10-CM

## 2017-02-20 DIAGNOSIS — Z08 Encounter for follow-up examination after completed treatment for malignant neoplasm: Secondary | ICD-10-CM | POA: Diagnosis not present

## 2017-02-20 DIAGNOSIS — Z87891 Personal history of nicotine dependence: Secondary | ICD-10-CM

## 2017-02-20 DIAGNOSIS — J019 Acute sinusitis, unspecified: Secondary | ICD-10-CM | POA: Diagnosis not present

## 2017-02-20 DIAGNOSIS — R911 Solitary pulmonary nodule: Secondary | ICD-10-CM | POA: Diagnosis not present

## 2017-02-20 NOTE — Progress Notes (Signed)
Subjective:     Patient ID: [name], female   DOB: 08-07-1951, 66 y.o.   MRN: [id number]  HPI  History of Present Illness [name] is a 66 y.o. female with COPD and a history of stage 1 lung cancer treated with surgery in 2010, she is followed by Dr. Chase Caller.   02/21/2016 Annual Follow UP: Pt. Continues to do well on her Anoro once daily. She rarely uses her rescue inhaler. CAT score is less than 10. CT scan of the chest is unchanged from the 2016 scan. She denies fever, chest pain, orthopnea or hemoptysis.She will get the Pneumovax vaccine today in the office.  Tests CT Chest Nodule Follow Up 02/06/2016:  IMPRESSION: 1. Previously noted right upper lobe nodules are unchanged, and considered benign. No follow-up recommended. This recommendation follows the consensus statement: Guidelines for Management of Incidental Pulmonary Nodules Detected on CT Images:From the Fleischner Society 2017; published online before print (10.1148/radiol.7322025427). 2. Mild diffuse bronchial wall thickening with mild centrilobular and paraseptal emphysema; imaging findings suggestive of underlying COPD. 3. Atherosclerosis, including left main and 2 vessel coronary artery disease. Please note that although the presence of coronary artery calcium documents the presence of coronary artery disease, the severity of this disease and any potential stenosis cannot be assessed on this non-gated CT examination. Assessment for potential risk factor modification, dietary therapy or pharmacologic therapy      OV 02/20/2017  Chief Complaint  Patient presents with  . Acute Visit    Pt has had either a cold or sinus problems. Pt was put on  Z-Pack and prednisone June 18 and has finished taking medicine. Pt has increased fatigue, mild cough, clear nasal drainage, and SOB.    Follow-up multiple issues  #Acute issue: A few weeks ago she called me through her daughter. She had sinus symptoms. We  called in Z-Pak and prednisone. She started off with a fever for one day as is with green sputum and some drainage. Since then she's significantly better. She still has a mild residual cough and clear runny nose that is higher than baseline. She also is associated fatigue that is mild. This is new for her. She feels her recovery phases slower and therefore she is concerned.  COPD: This is stable. COPD CAT Score is 7 today. She is on dual anticholinergic and long-acting beta agonist inhaler  Lung cancer surveillance: She needs a one year follow-up CT scan scheduled this summer 2018. This will be 7 years since she had cancer. As of now she is in complete remission.      CAT COPD Symptom & Quality of Life Score (GSK trademark) 0 is no burden. 5 is highest burden 02/20/2017   Never Cough -> Cough all the time 1  No phlegm in chest -> Chest is full of phlegm 0  No chest tightness -> Chest feels very tight 0  No dyspnea for 1 flight stairs/hill -> Very dyspneic for 1 flight of stairs 0  No limitations for ADL at home -> Very limited with ADL at home 0  Confident leaving home -> Not at all confident leaving home 1  Sleep soundly -> Do not sleep soundly because of lung condition 0  Lots of Energy -> No energy at all 4  TOTAL Score (max 40)  7        has a past medical history of COPD (chronic obstructive pulmonary disease) ([place]); Depression; Hyperlipidemia; and Lung cancer ([place]).   reports that she quit  smoking about 8 years ago. Her smoking use included Cigarettes. She has a 40.00 pack-year smoking history. She has never used smokeless tobacco.  Past Surgical History:  Procedure Laterality Date  . CHOLECYSTECTOMY    . LUNG CANCER SURGERY  2010   Left upper  . VAGINAL HYSTERECTOMY     including ovaries and fallopian tubes    Allergies  Allergen Reactions  . Codeine     REACTION: nausea  . Penicillins     REACTION: rash    Immunization History  Administered Date(s) Administered   . Influenza Split 05/20/2012  . Influenza Whole 06/06/2009, 04/15/2012  . Influenza-Unspecified 06/15/2013, 08/09/2014, 07/21/2015, 07/16/2016  . Pneumococcal Conjugate-13 09/22/2014  . Pneumococcal Polysaccharide-23 02/21/2016  . Tdap 09/05/2014    Family History  Problem Relation Age of Onset  . Hypertension Father   . Epilepsy Mother        hurt at birth, birth trauma reltaed  . CVA Father        hemorrhagic     Current Outpatient Prescriptions:  .  albuterol (PROAIR HFA) 108 (90 Base) MCG/ACT inhaler, Inhale 1-2 puffs into the lungs every 6 (six) hours as needed for wheezing or shortness of breath. Reported on 02/21/2016, Disp: , Rfl:  .  alendronate (FOSAMAX) 70 MG tablet, Take 1 tablet (70 mg total) by mouth every 7 (seven) days. Take with a full glass of water on an empty stomach., Disp: 4 tablet, Rfl: 11 .  ANORO ELLIPTA 62.5-25 MCG/INH AEPB, INHALE 1 PUFF INTO THE LUNGS DAILY, Disp: 30 each, Rfl: 5 .  aspirin EC 81 MG tablet, Take 1 tablet (81 mg total) by mouth daily., Disp: , Rfl:  .  atorvastatin (LIPITOR) 40 MG tablet, Take 1 tablet (40 mg total) by mouth daily., Disp: 90 tablet, Rfl: 2 .  meloxicam (MOBIC) 15 MG tablet, Take 1 tablet (15 mg total) by mouth daily., Disp: 30 tablet, Rfl: 3 .  oxyCODONE-acetaminophen (ROXICET) 5-325 MG per tablet, Take 0.5-1 tablets by mouth every 8 (eight) hours as needed for severe pain., Disp: 60 tablet, Rfl: 0    Review of Systems     Objective:   Physical Exam  Constitutional: She is oriented to person, place, and time. She appears well-developed and well-nourished. No distress.  HENT:  Head: Normocephalic and atraumatic.  Right Ear: External ear normal.  Left Ear: External ear normal.  Mouth/Throat: Oropharynx is clear and moist. No oropharyngeal exudate.  Eyes: Conjunctivae and EOM are normal. Pupils are equal, round, and reactive to light. Right eye exhibits no discharge. Left eye exhibits no discharge. No scleral icterus.   Neck: Normal range of motion. Neck supple. No JVD present. No tracheal deviation present. No thyromegaly present.  Cardiovascular: Normal rate, regular rhythm, normal heart sounds and intact distal pulses.  Exam reveals no gallop and no friction rub.   No murmur heard. Pulmonary/Chest: Effort normal and breath sounds normal. No respiratory distress. She has no wheezes. She has no rales. She exhibits no tenderness.  Abdominal: Soft. Bowel sounds are normal. She exhibits no distension and no mass. There is no tenderness. There is no rebound and no guarding.  Musculoskeletal: Normal range of motion. She exhibits no edema or tenderness.  Lymphadenopathy:    She has no cervical adenopathy.  Neurological: She is alert and oriented to person, place, and time. She has normal reflexes. No cranial nerve deficit. She exhibits normal muscle tone. Coordination normal.  Skin: Skin is warm and dry. No rash noted. She  is not diaphoretic. No erythema. No pallor.  Psychiatric: She has a normal mood and affect. Her behavior is normal. Judgment and thought content normal.  Vitals reviewed.  Vitals:   02/20/17 1028  BP: 126/64  Pulse: 86  SpO2: 98%  Weight: 146 lb 12.8 oz (66.6 kg)  Height: 5\' 3"  (1.6 m)        Assessment:       ICD-10-CM   1. Acute non-recurrent sinusitis, unspecified location J01.90   2. Pulmonary nodule, right R91.1   3. Encounter for follow-up surveillance of lung cancer Z08    Z85.118        Plan:      #acute sinusitis I think you are in recovery phase.  Will not add treatment but I fyou feel same in a week from now call back for 5 day prednisone  #Lung cancer surveillance  - do 7th year CT scan wo contrast in 6 weeks - can do at [facility]- will call with results  #COPD - stable. Continue anoro - flu shot in fall  #others Please talk to PCP Yong Channel Brayton Mars, MD -  and ensure you get  shingrix vaccine for shingles - new vacine   Followup 1 year or sooner if  needed  Dr. Brand Males, M.D., [facility].C.P Pulmonary and Critical Care Medicine Staff Physician [place] [facility] Pager: [phone], If no answer or between  15:00h - 7:00h: call [phone]  02/20/2017 11:10 AM

## 2017-02-20 NOTE — Patient Instructions (Addendum)
ICD-10-CM   1. Acute non-recurrent sinusitis, unspecified location J01.90   2. Pulmonary nodule, right R91.1   3. Encounter for follow-up surveillance of lung cancer Z08    Z85.118    #acute sinusitis I think you are in recovery phase.  Will not add treatment but I fyou feel same in a week from now call back for 5 day prednisone  #Lung cancer surveillance  - do 7th year CT scan wo contrast in 6 weeks - can do at [facility]- will call with results  #COPD - stable. Continue anoro - flu shot in fall  #others Please talk to PCP Marin Olp, MD -  and ensure you get  shingrix vaccine for shingles - new vacine   Followup 1 year or sooner if needed

## 2017-02-25 ENCOUNTER — Other Ambulatory Visit: Payer: Self-pay | Admitting: Family Medicine

## 2017-02-25 DIAGNOSIS — Z1231 Encounter for screening mammogram for malignant neoplasm of breast: Secondary | ICD-10-CM

## 2017-03-03 ENCOUNTER — Ambulatory Visit (INDEPENDENT_AMBULATORY_CARE_PROVIDER_SITE_OTHER)
Admission: RE | Admit: 2017-03-03 | Discharge: 2017-03-03 | Disposition: A | Discharge status: Home / Self Care | Payer: Medicare Other | Source: Ambulatory Visit | Attending: Acute Care | Admitting: Acute Care

## 2017-03-03 ENCOUNTER — Ambulatory Visit (INDEPENDENT_AMBULATORY_CARE_PROVIDER_SITE_OTHER): Payer: Medicare Other | Admitting: Acute Care

## 2017-03-03 ENCOUNTER — Encounter: Payer: Self-pay | Admitting: Acute Care

## 2017-03-03 DIAGNOSIS — Z87891 Personal history of nicotine dependence: Secondary | ICD-10-CM | POA: Diagnosis not present

## 2017-03-03 DIAGNOSIS — F17210 Nicotine dependence, cigarettes, uncomplicated: Secondary | ICD-10-CM

## 2017-03-03 NOTE — Progress Notes (Signed)
Shared Decision Making Visit Lung Cancer Screening Program [phone])   Eligibility:  Age 66 y.o.  Pack Years Smoking History Calculation 40-pack-year smoking history (# packs/per year x # years smoked)  Recent History of coughing up blood  no  Unexplained weight loss? no ( >Than 15 pounds within the last 6 months )  Prior History Lung / other cancer no (Diagnosis within the last 5 years already requiring surveillance chest CT Scans).  Smoking Status Former Smoker  Former Smokers: Years since quit: 9 years  Quit Date: 2010  Visit Components:  Discussion included one or more decision making aids. no  Discussion included risk/benefits of screening. no  Discussion included potential follow up diagnostic testing for abnormal scans. no  Discussion included meaning and risk of over diagnosis. no  Discussion included meaning and risk of False Positives. no  Discussion included meaning of total radiation exposure. no  Counseling Included:  Importance of adherence to annual lung cancer LDCT screening. no  Impact of comorbidities on ability to participate in the program. no  Ability and willingness to under diagnostic treatment. no  Smoking Cessation Counseling:  Current Smokers:   Discussed importance of smoking cessation.NA  Information about tobacco cessation classes and interventions provided to patient. yes  Patient provided with "ticket" for LDCT Scan. yes  Symptomatic Patient. no  Counseling  Diagnosis Code: Tobacco Use Z72.0  Asymptomatic Patient yes  Counseling (Intermediate counseling: > three minutes counseling) H4742  Former Smokers:   Discussed the importance of maintaining cigarette abstinence. yes  Diagnosis Code: Personal History of Nicotine Dependence. V95.638  Information about tobacco cessation classes and interventions provided to patient. Yes  Patient provided with "ticket" for LDCT Scan. yes  Written Order for Lung Cancer Screening  with LDCT placed in Epic. Yes (CT Chest Lung Cancer Screening Low Dose W/O CM) [id number] Z12.2-Screening of respiratory organs Z87.891-Personal history of nicotine dependence  I spent 25 minutes of face to face time with [name]  and her husband discussing the risks and benefits of lung cancer screening. We viewed a power point together that explained in detail the above noted topics. We took the time to pause the power point at intervals to allow for questions to be asked and answered to ensure understanding. We discussed that she had taken the single most powerful action possible to decrease her risk of developing lung cancer when she quit smoking. I counseled her to remain smoke free, and to contact me if she ever had the desire to smoke again so that I can provide resources and tools to help support the effort to remain smoke free. We discussed the time and location of the scan, and that either  Doroteo Glassman RN or I will call with the results within  24-48 hours of receiving them. She  has my card and contact information in the event she needs to speak with me, in addition to a copy of the power point we reviewed as a resource. She  verbalized understanding of all of the above and had no further questions upon leaving the office.     I explained to the patient that there has been a high incidence of coronary artery disease noted on these exams. I explained that this is a non-gated exam therefore degree or severity cannot be determined. This patient is currently on statin therapy. I have asked the patient to follow-up with their PCP regarding any incidental finding of coronary artery disease and management with diet or medication as they  feel is clinically indicated. The patient verbalized understanding of the above and had no further questions.     Magdalen Spatz, NP 03/03/2017

## 2017-03-04 ENCOUNTER — Other Ambulatory Visit: Payer: Self-pay | Admitting: Acute Care

## 2017-03-04 DIAGNOSIS — F17210 Nicotine dependence, cigarettes, uncomplicated: Principal | ICD-10-CM

## 2017-03-05 ENCOUNTER — Other Ambulatory Visit: Payer: Self-pay | Admitting: Family Medicine

## 2017-03-31 ENCOUNTER — Ambulatory Visit
Admission: RE | Admit: 2017-03-31 | Discharge: 2017-03-31 | Disposition: A | Discharge status: Home / Self Care | Payer: Medicare Other | Source: Ambulatory Visit | Attending: Family Medicine | Admitting: Family Medicine

## 2017-03-31 DIAGNOSIS — Z1231 Encounter for screening mammogram for malignant neoplasm of breast: Secondary | ICD-10-CM

## 2017-06-20 ENCOUNTER — Other Ambulatory Visit: Payer: Self-pay | Admitting: Pharmacist

## 2017-06-20 NOTE — Patient Outreach (Signed)
Outreach call to [employer] regarding her request for follow up from the [facility] Medication Adherence Campaign. Reach a recording that patient's phone number has been disconnected or is no longer in service.  Harlow Asa, PharmD, [employer] [phone]

## 2017-08-19 HISTORY — PX: COLONOSCOPY: SHX174

## 2017-09-16 ENCOUNTER — Other Ambulatory Visit: Payer: Self-pay | Admitting: Internal Medicine

## 2017-10-21 ENCOUNTER — Other Ambulatory Visit: Payer: Self-pay | Admitting: Family Medicine

## 2017-10-21 ENCOUNTER — Other Ambulatory Visit: Payer: Self-pay | Admitting: Internal Medicine

## 2017-11-17 ENCOUNTER — Other Ambulatory Visit: Payer: Self-pay | Admitting: Family Medicine

## 2017-11-17 ENCOUNTER — Other Ambulatory Visit: Payer: Self-pay

## 2018-01-13 ENCOUNTER — Other Ambulatory Visit: Payer: Self-pay | Admitting: Family Medicine

## 2018-02-04 ENCOUNTER — Encounter: Payer: Self-pay | Admitting: Family Medicine

## 2018-02-04 ENCOUNTER — Ambulatory Visit: Payer: Medicare Other | Admitting: Family Medicine

## 2018-02-04 VITALS — BP 126/82 | HR 90 | Temp 98.7°F | Ht 63.0 in | Wt 151.4 lb

## 2018-02-04 DIAGNOSIS — R3589 Other polyuria: Secondary | ICD-10-CM

## 2018-02-04 DIAGNOSIS — R358 Other polyuria: Secondary | ICD-10-CM | POA: Diagnosis not present

## 2018-02-04 DIAGNOSIS — Z1211 Encounter for screening for malignant neoplasm of colon: Secondary | ICD-10-CM | POA: Diagnosis not present

## 2018-02-04 LAB — POC URINALSYSI DIPSTICK (AUTOMATED)
Bilirubin, UA: NEGATIVE
GLUCOSE UA: NEGATIVE
Ketones, UA: NEGATIVE
Nitrite, UA: POSITIVE
Protein, UA: NEGATIVE
SPEC GRAV UA: 1.02 (ref 1.010–1.025)
Urobilinogen, UA: 1 E.U./dL
pH, UA: 6 (ref 5.0–8.0)

## 2018-02-04 MED ORDER — NITROFURANTOIN MONOHYD MACRO 100 MG PO CAPS: 100.0000 mg | ORAL_CAPSULE | Freq: Two times a day (BID) | ORAL | 0 refills | Status: AC

## 2018-02-04 NOTE — Patient Instructions (Addendum)
Health Maintenance Due  Topic Date Due  . COLONOSCOPY -referred to GI 09/04/1999   UA concerning for UTI with nitrites and leukocytes. Likely UTI. Will get culture. Empiric treatment with: nitrofurantoin twice a day for 7 days Patient to follow up if new or worsening symptoms or failure to improve.

## 2018-02-04 NOTE — Progress Notes (Signed)
Subjective:  [name] is a 67 y.o. year old very pleasant female patient who presents for/with See problem oriented charting ROS- see ROS embedded below   Past Medical History-  Patient Active Problem List   Diagnosis Date Noted  . Coronary artery calcification 10/31/2012    Priority: High  . Elevated blood pressure 03/13/2015    Priority: Medium  . Osteoporosis 09/05/2014    Priority: Medium  . Chronic low back pain 09/05/2014    Priority: Medium  . COPD, frequent exacerbations ([place]) 03/29/2011    Priority: Medium  . COPD (chronic obstructive pulmonary disease) ([place]) 06/06/2009    Priority: Medium  . Encounter for follow-up surveillance of lung cancer 01/05/2009    Priority: Medium  . Hyperlipidemia 06/29/2006    Priority: Medium  . Ganglion cyst of finger of right hand 09/23/2016    Priority: Low  . Former smoker 03/13/2015    Priority: Low  . Panlobular emphysema ([place]) 02/16/2015    Priority: Low  . Pulmonary nodule, right 03/29/2011    Priority: Low  . Acute non-recurrent sinusitis 02/20/2017    Medications- reviewed and updated Current Outpatient Medications  Medication Sig Dispense Refill  . albuterol (PROAIR HFA) 108 (90 Base) MCG/ACT inhaler Inhale 1-2 puffs into the lungs every 6 (six) hours as needed for wheezing or shortness of breath. Reported on 02/21/2016    . alendronate (FOSAMAX) 70 MG tablet TAKE 1 TABLET BY MOUTH EVERY 7 DAYS WITH A FULL GLASS OF WATER AND ON AN EMPTY STOMACH. 8 tablet 0  . ANORO ELLIPTA 62.5-25 MCG/INH AEPB INHALE 1 PUFF INTO THE LUNGS DAILY 60 each 5  . aspirin EC 81 MG tablet Take 1 tablet (81 mg total) by mouth daily.    Marland Kitchen atorvastatin (LIPITOR) 40 MG tablet TAKE 1 TABLET(40 MG) BY MOUTH DAILY 90 tablet 1  . meloxicam (MOBIC) 15 MG tablet Take 1 tablet (15 mg total) by mouth daily. 30 tablet 3  . nitrofurantoin, macrocrystal-monohydrate, (MACROBID) 100 MG capsule Take 1 capsule (100 mg total) by mouth 2 (two) times daily for 7  days. 14 capsule 0  . oxyCODONE-acetaminophen (ROXICET) 5-325 MG per tablet Take 0.5-1 tablets by mouth every 8 (eight) hours as needed for severe pain. (Patient not taking: Reported on 02/04/2018) 60 tablet 0   No current facility-administered medications for this visit.     Objective: BP 126/82 (BP Location: Left Arm, Patient Position: Sitting, Cuff Size: Large)   Pulse 90   Temp 98.7 F (37.1 C) (Oral)   Ht 5\' 3"  (1.6 m)   Wt 151 lb 6.4 oz (68.7 kg)   SpO2 98%   BMI 26.82 kg/m  Gen: NAD, resting comfortably CV: RRR no murmurs rubs or gallops Lungs: CTAB no crackles, wheeze, rhonchi Abdomen: soft/nontender/nondistended No suprapubic or CVA tenderness Ext: no edema Skin: warm, dry  Results for orders placed or performed in visit on 02/04/18 (from the past 24 hour(s))  POCT Urinalysis Dipstick (Automated)     Status: Abnormal   Collection Time: 02/04/18  9:33 AM  Result Value Ref Range   Color, UA Yellow    Clarity, UA Cloudy    Glucose, UA Negative Negative   Bilirubin, UA Negative    Ketones, UA Negative    Spec Grav, UA 1.020 1.010 - 1.025   Blood, UA Moderate    pH, UA 6.0 5.0 - 8.0   Protein, UA Negative Negative   Urobilinogen, UA 1.0 0.2 or 1.0 E.U./dL   Nitrite, UA  Positive    Leukocytes, UA Moderate (2+) (A) Negative   Assessment/Plan:  Concern for UTI S: Patients symptoms started Monday morning- has taken a fe doses of AZO without improvement.  Complains of dysuria: yes; polyuria: yes; nocturia: yes; urgency: yes.  Symptoms are worsening- is also getting some chills but no fevers yet.  ROS- no fever, nausea, vomiting, flank pain. No blood in urine.  A/P: UA concerning for UTI with nitrites and leukocytes. Likely UTI. Will get culture. Empiric treatment with: nitrofurantoin twice a day for 7 days. Would usually use keflex- but will use as back up given PCN allergy. 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 Avoid cipro as first line.  Patient to follow up if  new or worsening symptoms or failure to improve.   Lab/Order associations: Polyuria - Plan: Urine Culture, POCT Urinalysis Dipstick (Automated), Urine Culture  Screening for colon cancer - Plan: Ambulatory referral to Gastroenterology  Finally agrees to colonoscopy!!!!  Meds ordered this encounter  Medications  . nitrofurantoin, macrocrystal-monohydrate, (MACROBID) 100 MG capsule    Sig: Take 1 capsule (100 mg total) by mouth 2 (two) times daily for 7 days.    Dispense:  14 capsule    Refill:  0    Return precautions advised.  Garret Reddish, MD

## 2018-02-06 LAB — URINE CULTURE
MICRO NUMBER: 90734388
SPECIMEN QUALITY:: ADEQUATE

## 2018-02-09 ENCOUNTER — Telehealth: Payer: Self-pay | Admitting: Family Medicine

## 2018-02-09 NOTE — Telephone Encounter (Signed)
Copied from [place] [id number]. Topic: Quick Communication - Lab Results >> Feb 09, 2018  3:38 PM Dark, Angelena Sole, CMA wrote: Called patient to inform them of 02/04/2018 lab results. When patient returns call, triage nurse may disclose results.  She will be available until 5 tonight

## 2018-02-10 ENCOUNTER — Telehealth: Payer: Self-pay | Admitting: Internal Medicine

## 2018-02-10 ENCOUNTER — Telehealth: Payer: Self-pay | Admitting: Family Medicine

## 2018-02-10 NOTE — Telephone Encounter (Signed)
Left message for pt. To call back about lab results.

## 2018-02-10 NOTE — Telephone Encounter (Signed)
ATC patient but received a message stating that she has our number blocked.

## 2018-02-10 NOTE — Telephone Encounter (Signed)
Copied from [place] [id number]. Topic: Quick Communication - Lab Results >> Feb 09, 2018  3:38 PM Dark, Angelena Sole, CMA wrote: Called patient to inform them of 02/04/2018 lab results. When patient returns call, triage nurse may disclose results. >> Feb 09, 2018  3:53 PM Boyd Kerbs wrote: Pt. Returning call for lab results. Please call her back [facility] can give results

## 2018-02-11 NOTE — Telephone Encounter (Signed)
I called pt but a message came on stating our number was blocked. Will try again later.

## 2018-02-12 ENCOUNTER — Encounter: Payer: Self-pay | Admitting: *Deleted

## 2018-02-12 NOTE — Telephone Encounter (Signed)
ATC patient but our number has been blocked Patient is active in [place] - e-mail sent  Per office protocol, because this is the 3rd time we have attempted to contact patient without success, will sign off

## 2018-02-13 ENCOUNTER — Telehealth: Payer: Self-pay | Admitting: Internal Medicine

## 2018-02-13 MED ORDER — ALBUTEROL SULFATE HFA 108 (90 BASE) MCG/ACT IN AERS: 1.0000 | INHALATION_SPRAY | Freq: Four times a day (QID) | RESPIRATORY_TRACT | 5 refills | Status: DC | PRN

## 2018-02-13 NOTE — Telephone Encounter (Signed)
Called and spoke to pt. Pt requesting a refill for albuterol HFA and questioning when her LDCT chest will be. Rx sent to preferred pharmacy and advised pt that her LDCT chest will likely be in 02/2018 but to call us by 3rd week in July if she has not be scheduled. Pt is also needing appt with MR, scheduled appt with MR in 03/2018. Pt verbalized understanding and denied any further questions or concerns at this time.

## 2018-02-16 ENCOUNTER — Encounter: Payer: Self-pay | Admitting: Gastroenterology

## 2018-03-04 ENCOUNTER — Ambulatory Visit (AMBULATORY_SURGERY_CENTER): Payer: Self-pay

## 2018-03-04 VITALS — Ht 64.0 in | Wt 151.0 lb

## 2018-03-04 DIAGNOSIS — Z1211 Encounter for screening for malignant neoplasm of colon: Secondary | ICD-10-CM

## 2018-03-04 MED ORDER — SOD PICOSULFATE-MAG OX-CIT ACD 10-3.5-12 MG-GM -GM/160ML PO SOLN: 1.0000 | Freq: Once | ORAL | Status: AC

## 2018-03-04 NOTE — Progress Notes (Signed)
Per pt, no allergies to soy or egg products.Pt not taking any weight loss meds or using  O2 at home.  Pt refused emmi video. 

## 2018-03-05 ENCOUNTER — Other Ambulatory Visit: Payer: Self-pay | Admitting: Family Medicine

## 2018-03-05 ENCOUNTER — Encounter: Payer: Self-pay | Admitting: Gastroenterology

## 2018-03-05 DIAGNOSIS — Z1231 Encounter for screening mammogram for malignant neoplasm of breast: Secondary | ICD-10-CM

## 2018-03-06 ENCOUNTER — Other Ambulatory Visit: Payer: Self-pay | Admitting: Family Medicine

## 2018-03-09 ENCOUNTER — Ambulatory Visit (INDEPENDENT_AMBULATORY_CARE_PROVIDER_SITE_OTHER)
Admission: RE | Admit: 2018-03-09 | Discharge: 2018-03-09 | Disposition: A | Discharge status: Home / Self Care | Payer: Medicare Other | Source: Ambulatory Visit | Attending: Acute Care | Admitting: Acute Care

## 2018-03-09 DIAGNOSIS — F17210 Nicotine dependence, cigarettes, uncomplicated: Secondary | ICD-10-CM

## 2018-03-13 ENCOUNTER — Encounter: Payer: Self-pay | Admitting: Family Medicine

## 2018-03-17 ENCOUNTER — Other Ambulatory Visit: Payer: Self-pay | Admitting: Acute Care

## 2018-03-17 DIAGNOSIS — F17210 Nicotine dependence, cigarettes, uncomplicated: Principal | ICD-10-CM

## 2018-03-17 DIAGNOSIS — Z122 Encounter for screening for malignant neoplasm of respiratory organs: Secondary | ICD-10-CM

## 2018-03-18 ENCOUNTER — Ambulatory Visit (AMBULATORY_SURGERY_CENTER): Payer: Medicare Other | Admitting: Gastroenterology

## 2018-03-18 ENCOUNTER — Encounter: Payer: Self-pay | Admitting: Gastroenterology

## 2018-03-18 ENCOUNTER — Other Ambulatory Visit: Payer: Self-pay

## 2018-03-18 VITALS — BP 121/68 | HR 67 | Temp 98.6°F | Resp 15 | Ht 64.0 in | Wt 151.0 lb

## 2018-03-18 DIAGNOSIS — Z1211 Encounter for screening for malignant neoplasm of colon: Secondary | ICD-10-CM

## 2018-03-18 DIAGNOSIS — K635 Polyp of colon: Secondary | ICD-10-CM

## 2018-03-18 DIAGNOSIS — D125 Benign neoplasm of sigmoid colon: Secondary | ICD-10-CM

## 2018-03-18 MED ORDER — SODIUM CHLORIDE 0.9 % IV SOLN: 500.0000 mL | INTRAVENOUS | Status: DC

## 2018-03-18 NOTE — Progress Notes (Signed)
To PACU, VSS. Report to RN.tb 

## 2018-03-18 NOTE — Progress Notes (Signed)
Called to room to assist during endoscopic procedure.  Patient ID and intended procedure confirmed with present staff. Received instructions for my participation in the procedure from the performing physician.  

## 2018-03-18 NOTE — Op Note (Signed)
[facility] Patient Name: [name] Procedure Date: 03/18/2018 8:10 AM MRN: [id number] Endoscopist: Jackquline Denmark , MD Age: 67 Referring MD:  Date of Birth: 01-14-1951 Gender: Female Account #: [id number] Procedure:                Colonoscopy Indications:              Screening for colorectal malignant neoplasm Medicines:                Monitored Anesthesia Care Procedure:                Pre-Anesthesia Assessment:                           - Prior to the procedure, a History and Physical                            was performed, and patient medications and                            allergies were reviewed. The patient is competent.                            The risks and benefits of the procedure and the                            sedation options and risks were discussed with the                            patient. All questions were answered and informed                            consent was obtained. Patient identification and                            proposed procedure were verified by the physician                            in the procedure room. Mental Status Examination:                            alert and oriented. Prophylactic Antibiotics: The                            patient does not require prophylactic antibiotics.                            Prior Anticoagulants: The patient has taken                            aspirin, last dose was 1 day prior to procedure.                            ASA Grade Assessment: II - A patient with mild  systemic disease. After reviewing the risks and                            benefits, the patient was deemed in satisfactory                            condition to undergo the procedure. The anesthesia                            plan was to use monitored anesthesia care (MAC).                            Immediately prior to administration of medications,                            the patient was  re-assessed for adequacy to receive                            sedatives. The heart rate, respiratory rate, oxygen                            saturations, blood pressure, adequacy of pulmonary                            ventilation, and response to care were monitored                            throughout the procedure. The physical status of                            the patient was re-assessed after the procedure.                           After obtaining informed consent, the colonoscope                            was passed under direct vision. Throughout the                            procedure, the patient's blood pressure, pulse, and                            oxygen saturations were monitored continuously. The                            Colonoscope was introduced through the anus and                            advanced to the 2 cm into the ileum. The                            colonoscopy was performed without difficulty. The  patient tolerated the procedure well. The quality                            of the bowel preparation was good. Scope In: 8:17:02 AM Scope Out: 8:36:49 AM Scope Withdrawal Time: 0 hours 15 minutes 45 seconds  Total Procedure Duration: 0 hours 19 minutes 47 seconds  Findings:                 Three 12 mm, 8 mm and 6 mm polyps in the mid and                            distal sigmoid colon, 2 larger removed with a hot                            snare and smaller removed with cold snare. Resected                            and retrieved. Estimated blood loss: none.                           Multiple small-mouthed diverticula were found in                            the sigmoid colon, descending colon and few in                            ascending colon.                           Non-bleeding internal hemorrhoids were found during                            retroflexion. The hemorrhoids were small. Complications:            No immediate  complications. Estimated Blood Loss:     Estimated blood loss: none. Impression:               - Colonic polyps status post polypectomy.                           - Pancolonic diverticulosis predominantly in the                            sigmoid colon.                           - Non-bleeding internal hemorrhoids. Recommendation:           - Patient has a contact number available for                            emergencies. The signs and symptoms of potential                            delayed complications were discussed with the  patient. Return to normal activities tomorrow.                            Written discharge instructions were provided to the                            patient.                           - High fiber diet.                           - Continue present medications.                           - Await pathology results.                           - No aspirin, ibuprofen, naproxen, or other                            non-steroidal anti-inflammatory drugs for 5 days                            after polyp removal.                           - Repeat colonoscopy for surveillance based on                            pathology results.                           - Return to GI clinic PRN. Jackquline Denmark, MD 03/18/2018 8:47:22 AM This report has been signed electronically.

## 2018-03-18 NOTE — Patient Instructions (Signed)
YOU HAD AN ENDOSCOPIC PROCEDURE TODAY AT [facility]:   Refer to the procedure report that was given to you for any specific questions about what was found during the examination.  If the procedure report does not answer your questions, please call your gastroenterologist to clarify.  If you requested that your care partner not be given the details of your procedure findings, then the procedure report has been included in a sealed envelope for you to review at your convenience later.  YOU SHOULD EXPECT: Some feelings of bloating in the abdomen. Passage of more gas than usual.  Walking can help get rid of the air that was put into your GI tract during the procedure and reduce the bloating. If you had a lower endoscopy (such as a colonoscopy or flexible sigmoidoscopy) you may notice spotting of blood in your stool or on the toilet paper. If you underwent a bowel prep for your procedure, you may not have a normal bowel movement for a few days.  Please Note:  You might notice some irritation and congestion in your nose or some drainage.  This is from the oxygen used during your procedure.  There is no need for concern and it should clear up in a day or so.  SYMPTOMS TO REPORT IMMEDIATELY:   Following lower endoscopy (colonoscopy or flexible sigmoidoscopy):  Excessive amounts of blood in the stool  Significant tenderness or worsening of abdominal pains  Swelling of the abdomen that is new, acute  Fever of 100F or higher      For urgent or emergent issues, a gastroenterologist can be reached at any hour by calling [phone].   DIET:  We do recommend a small meal at first, but then you may proceed to your regular diet.  Drink plenty of fluids but you should avoid alcoholic beverages for 24 hours.  ACTIVITY:  You should plan to take it easy for the rest of today and you should NOT DRIVE or use heavy machinery until tomorrow (because of the sedation medicines used during the  test).    FOLLOW UP: Our staff will call the number listed on your records the next business day following your procedure to check on you and address any questions or concerns that you may have regarding the information given to you following your procedure. If we do not reach you, we will leave a message.  However, if you are feeling well and you are not experiencing any problems, there is no need to return our call.  We will assume that you have returned to your regular daily activities without incident.  If any biopsies were taken you will be contacted by phone or by letter within the next 1-3 weeks.  Please call us at [phone] if you have not heard about the biopsies in 3 weeks.    SIGNATURES/CONFIDENTIALITY: You and/or your care partner have signed paperwork which will be entered into your electronic medical record.  These signatures attest to the fact that that the information above on your After Visit Summary has been reviewed and is understood.  Full responsibility of the confidentiality of this discharge information lies with you and/or your care-partner.  Polyp, diverticulosis, hemorrhoid, and high fiber diet information given.

## 2018-03-18 NOTE — Progress Notes (Signed)
Pt's states no medical or surgical changes since previsit or office visit. 

## 2018-03-19 ENCOUNTER — Telehealth: Payer: Self-pay | Admitting: *Deleted

## 2018-03-19 NOTE — Telephone Encounter (Signed)
  Follow up Call-  Call back number 03/18/2018  Post procedure Call Back phone  # [phone]  Permission to leave phone message Yes  Some recent data might be hidden     Patient questions:  Do you have a fever, pain , or abdominal swelling? No. Pain Score  0 *  Have you tolerated food without any problems? Yes.    Have you been able to return to your normal activities? Yes.    Do you have any questions about your discharge instructions: Diet   No. Medications  No. Follow up visit  No.  Do you have questions or concerns about your Care? No.  Actions: * If pain score is 4 or above: No action needed, pain <4.

## 2018-04-05 ENCOUNTER — Encounter: Payer: Self-pay | Admitting: Gastroenterology

## 2018-04-06 ENCOUNTER — Encounter: Payer: Self-pay | Admitting: Internal Medicine

## 2018-04-06 ENCOUNTER — Ambulatory Visit: Payer: Medicare Other | Admitting: Internal Medicine

## 2018-04-06 VITALS — BP 116/72 | HR 81 | Ht 63.0 in | Wt 152.6 lb

## 2018-04-06 DIAGNOSIS — R911 Solitary pulmonary nodule: Secondary | ICD-10-CM

## 2018-04-06 DIAGNOSIS — J439 Emphysema, unspecified: Secondary | ICD-10-CM

## 2018-04-06 DIAGNOSIS — Z122 Encounter for screening for malignant neoplasm of respiratory organs: Secondary | ICD-10-CM | POA: Diagnosis not present

## 2018-04-06 MED ORDER — UMECLIDINIUM-VILANTEROL 62.5-25 MCG/INH IN AEPB: 1.0000 | INHALATION_SPRAY | Freq: Every day | RESPIRATORY_TRACT | 0 refills | Status: DC

## 2018-04-06 NOTE — Progress Notes (Signed)
Subjective:     Patient ID: [name], female   DOB: 1951-02-10, 67 y.o.   MRN: [id number]  HPI  02/21/2016 Annual Follow UP: Pt. Continues to do well on her Anoro once daily. She rarely uses her rescue inhaler. CAT score is less than 10. CT scan of the chest is unchanged from the 2016 scan. She denies fever, chest pain, orthopnea or hemoptysis.She will get the Pneumovax vaccine today in the office.  Tests CT Chest Nodule Follow Up 02/06/2016:  IMPRESSION: 1. Previously noted right upper lobe nodules are unchanged, and considered benign. No follow-up recommended. This recommendation follows the consensus statement: Guidelines for Management of Incidental Pulmonary Nodules Detected on CT Images:From the Fleischner Society 2017; published online before print (10.1148/[id number]). 2. Mild diffuse bronchial wall thickening with mild centrilobular and paraseptal emphysema; imaging findings suggestive of underlying COPD. 3. Atherosclerosis, including left main and 2 vessel coronary artery disease. Please note that although the presence of coronary artery calcium documents the presence of coronary artery disease, the severity of this disease and any potential stenosis cannot be assessed on this non-gated CT examination. Assessment for potential risk factor modification, dietary therapy or pharmacologic therapy      OV 02/20/2017  Chief Complaint  Patient presents with  . Acute Visit    Pt has had either a cold or sinus problems. Pt was put on  Z-Pack and prednisone June 18 and has finished taking medicine. Pt has increased fatigue, mild cough, clear nasal drainage, and SOB.    Follow-up multiple issues  #Acute issue: A few weeks ago she called me through her daughter. She had sinus symptoms. We called in Z-Pak and prednisone. She started off with a fever for one day as is with green sputum and some drainage. Since then she's significantly better. She still has a mild  residual cough and clear runny nose that is higher than baseline. She also is associated fatigue that is mild. This is new for her. She feels her recovery phases slower and therefore she is concerned.  COPD: This is stable. COPD CAT Score is 7 today. She is on dual anticholinergic and long-acting beta agonist inhaler  Lung cancer surveillance: She needs a one year follow-up CT scan scheduled this summer 2018. This will be 7 years since she had cancer. As of now she is in complete remission.   OV 04/06/2018  Chief Complaint  Patient presents with  . Follow-up    Pt states she has been doing well since last visit. States she has had some SOB with the heat, has had an occ. CP. Denies any cough.      Fu  Lung cancer post Rx surveillance and screenig - CT July 2019 with new GGO 42mm in RUL but otherwise benign CT  Emphysema: well. CAT score is 4 and better. On anoro  Overall doing reall well   CAT COPD Symptom & Quality of Life Score ([place] trademark) 0 is no burden. 5 is highest burden 02/20/2017  04/06/2018   Never Cough -> Cough all the time 1 1  No phlegm in chest -> Chest is full of phlegm 0 0  No chest tightness -> Chest feels very tight 0 0  No dyspnea for 1 flight stairs/hill -> Very dyspneic for 1 flight of stairs 0 3  No limitations for ADL at home -> Very limited with ADL at home 0 0  Confident leaving home -> Not at all confident leaving home 1 0  Sleep soundly ->  Do not sleep soundly because of lung condition 0 0  Lots of Energy -> No energy at all 4 0  TOTAL Score (max 40)  7 4      has a past medical history of Complication of anesthesia, COPD (chronic obstructive pulmonary disease) ([place]), Depression, Hyperlipidemia, Lung cancer ([place]) (2010), and Osteoporosis.   reports that she quit smoking about 9 years ago. Her smoking use included cigarettes. She has a 40.00 pack-year smoking history. She has never used smokeless tobacco.  Past Surgical History:  Procedure  Laterality Date  . CHOLECYSTECTOMY    . LUNG CANCER SURGERY  2010   Left upper  . VAGINAL HYSTERECTOMY     including ovaries and fallopian tubes    Allergies  Allergen Reactions  . Codeine     REACTION: nausea  . Penicillins     REACTION: rash    Immunization History  Administered Date(s) Administered  . Influenza Split 05/20/2012  . Influenza Whole 06/06/2009, 04/15/2012  . Influenza-Unspecified 06/15/2013, 08/09/2014, 07/21/2015, 07/16/2016, 07/14/2017  . Pneumococcal Conjugate-13 09/22/2014  . Pneumococcal Polysaccharide-23 02/21/2016  . Tdap 09/05/2014    Family History  Problem Relation Age of Onset  . Epilepsy Mother        hurt at birth, birth trauma reltaed  . Hypertension Father   . CVA Father        hemorrhagic  . Breast cancer Neg Hx      Current Outpatient Medications:  .  albuterol (PROAIR HFA) 108 (90 Base) MCG/ACT inhaler, Inhale 1-2 puffs into the lungs every 6 (six) hours as needed for wheezing or shortness of breath. Reported on 02/21/2016, Disp: 1 Inhaler, Rfl: 5 .  alendronate (FOSAMAX) 70 MG tablet, TAKE 1 TABLET BY MOUTH EVERY 7 DAYS WITH A FULL GLASS OF WATER AND ON AN EMPTY STOMACH, Disp: 8 tablet, Rfl: 0 .  ANORO ELLIPTA 62.5-25 MCG/INH AEPB, INHALE 1 PUFF INTO THE LUNGS DAILY, Disp: 60 each, Rfl: 5 .  aspirin EC 81 MG tablet, Take 1 tablet (81 mg total) by mouth daily., Disp: , Rfl:  .  atorvastatin (LIPITOR) 40 MG tablet, TAKE 1 TABLET(40 MG) BY MOUTH DAILY, Disp: 90 tablet, Rfl: 1 .  meloxicam (MOBIC) 15 MG tablet, Take 1 tablet (15 mg total) by mouth daily., Disp: 30 tablet, Rfl: 3 .  oxyCODONE-acetaminophen (ROXICET) 5-325 MG per tablet, Take 0.5-1 tablets by mouth every 8 (eight) hours as needed for severe pain., Disp: 60 tablet, Rfl: 0   Review of Systems     Objective:   Physical Exam  Constitutional: She is oriented to person, place, and time. She appears well-developed and well-nourished. No distress.  HENT:  Head: Normocephalic  and atraumatic.  Right Ear: External ear normal.  Left Ear: External ear normal.  Mouth/Throat: Oropharynx is clear and moist. No oropharyngeal exudate.  Eyes: Pupils are equal, round, and reactive to light. Conjunctivae and EOM are normal. Right eye exhibits no discharge. Left eye exhibits no discharge. No scleral icterus.  Neck: Normal range of motion. Neck supple. No JVD present. No tracheal deviation present. No thyromegaly present.  Cardiovascular: Normal rate, regular rhythm, normal heart sounds and intact distal pulses. Exam reveals no gallop and no friction rub.  No murmur heard. Pulmonary/Chest: Effort normal and breath sounds normal. No respiratory distress. She has no wheezes. She has no rales. She exhibits no tenderness.  Abdominal: Soft. Bowel sounds are normal. She exhibits no distension and no mass. There is no tenderness. There is no rebound  and no guarding.  Musculoskeletal: Normal range of motion. She exhibits no edema or tenderness.  Lymphadenopathy:    She has no cervical adenopathy.  Neurological: She is alert and oriented to person, place, and time. She has normal reflexes. No cranial nerve deficit. She exhibits normal muscle tone. Coordination normal.  Skin: Skin is warm and dry. No rash noted. She is not diaphoretic. No erythema. No pallor.  Psychiatric: She has a normal mood and affect. Her behavior is normal. Judgment and thought content normal.  Vitals reviewed.  Vitals:   04/06/18 1334  BP: 116/72  Pulse: 81  SpO2: 96%  Weight: 152 lb 9.6 oz (69.2 kg)  Height: 5\' 3"  (1.6 m)    Estimated body mass index is 27.03 kg/m as calculated from the following:   Height as of this encounter: 5\' 3"  (1.6 m).   Weight as of this encounter: 152 lb 9.6 oz (69.2 kg).     Assessment:     Problem List Items Addressed This Visit    COPD (chronic obstructive pulmonary disease) ([place])    Other Visit Diagnoses    Encounter for screening for malignant neoplasm of respiratory  organs    -  Primary         Plan:     Encounter for screening for malignant neoplasm of respiratory organs  - benging looking CT - there eis a small ground glass opacity ([place] forgot to mention that) right upper lobe  - recommendation is to still repeat CT chest without contrast in 1 year  Pulmonary emphysema, unspecified emphysema type ([place])  - continue anoro; take samples = symptoms better than even last year  Followup  - 1 year or sooner if needed   Dr. Brand Males, M.D., [facility].C.P Pulmonary and Critical Care Medicine Staff Physician, [place] Director - Interstitial Lung Disease  Program  Pulmonary [place] at [place], [place], [address]  Pager: [phone], If no answer or between  15:00h - 7:00h: call [phone] Telephone: [phone]

## 2018-04-06 NOTE — Patient Instructions (Addendum)
Encounter for screening for malignant neoplasm of respiratory organs  - benging looking CT - there eis a small ground glass opacity ([place] forgot to mention that) right upper lobe  - recommendation is to still repeat CT chest without contrast in 1 year  Pulmonary emphysema, unspecified emphysema type ([place])  - continue anoro; take samples = symptoms better than even last year  Followup  - 1 year or sooner if needed

## 2018-04-06 NOTE — Addendum Note (Signed)
Addended by: Lorretta Harp on: 04/06/2018 02:18 PM   Modules accepted: Orders

## 2018-04-07 ENCOUNTER — Ambulatory Visit: Payer: Medicare Other | Admitting: Internal Medicine

## 2018-04-07 ENCOUNTER — Ambulatory Visit: Payer: Medicare Other

## 2018-04-08 ENCOUNTER — Ambulatory Visit
Admission: RE | Admit: 2018-04-08 | Discharge: 2018-04-08 | Disposition: A | Discharge status: Home / Self Care | Payer: Medicare Other | Source: Ambulatory Visit | Attending: Family Medicine | Admitting: Family Medicine

## 2018-04-08 ENCOUNTER — Encounter: Payer: Self-pay | Admitting: Family Medicine

## 2018-04-08 DIAGNOSIS — Z86010 Personal history of colonic polyps: Secondary | ICD-10-CM | POA: Insufficient documentation

## 2018-04-08 DIAGNOSIS — Z1231 Encounter for screening mammogram for malignant neoplasm of breast: Secondary | ICD-10-CM

## 2018-05-05 ENCOUNTER — Other Ambulatory Visit: Payer: Self-pay | Admitting: Family Medicine

## 2018-05-26 ENCOUNTER — Other Ambulatory Visit: Payer: Self-pay | Admitting: Family Medicine

## 2018-06-23 ENCOUNTER — Other Ambulatory Visit: Payer: Self-pay | Admitting: Internal Medicine

## 2018-09-01 ENCOUNTER — Other Ambulatory Visit (HOSPITAL_COMMUNITY): Payer: Self-pay | Admitting: Neurosurgery

## 2018-09-01 DIAGNOSIS — E2839 Other primary ovarian failure: Secondary | ICD-10-CM

## 2018-09-07 ENCOUNTER — Ambulatory Visit (HOSPITAL_COMMUNITY)
Admission: RE | Admit: 2018-09-07 | Discharge: 2018-09-07 | Disposition: A | Discharge status: Home / Self Care | Payer: Medicare Other | Source: Ambulatory Visit | Attending: Neurosurgery | Admitting: Neurosurgery

## 2018-09-07 DIAGNOSIS — E2839 Other primary ovarian failure: Secondary | ICD-10-CM

## 2018-09-17 ENCOUNTER — Encounter: Payer: Self-pay | Admitting: Family Medicine

## 2018-09-17 NOTE — Telephone Encounter (Signed)
Please advise 

## 2018-09-18 ENCOUNTER — Ambulatory Visit: Payer: Medicare Other | Admitting: Family Medicine

## 2018-10-26 ENCOUNTER — Encounter: Payer: Self-pay | Admitting: Family Medicine

## 2018-10-26 ENCOUNTER — Ambulatory Visit: Payer: Medicare Other | Admitting: Family Medicine

## 2018-10-26 VITALS — BP 130/82 | HR 86 | Temp 98.4°F | Ht 63.0 in | Wt 144.4 lb

## 2018-10-26 DIAGNOSIS — E785 Hyperlipidemia, unspecified: Secondary | ICD-10-CM | POA: Diagnosis not present

## 2018-10-26 DIAGNOSIS — M810 Age-related osteoporosis without current pathological fracture: Secondary | ICD-10-CM | POA: Diagnosis not present

## 2018-10-26 DIAGNOSIS — J449 Chronic obstructive pulmonary disease, unspecified: Secondary | ICD-10-CM | POA: Diagnosis not present

## 2018-10-26 DIAGNOSIS — R739 Hyperglycemia, unspecified: Secondary | ICD-10-CM | POA: Diagnosis not present

## 2018-10-26 DIAGNOSIS — M545 Low back pain: Secondary | ICD-10-CM | POA: Diagnosis not present

## 2018-10-26 DIAGNOSIS — G8929 Other chronic pain: Secondary | ICD-10-CM

## 2018-10-26 LAB — CBC WITH DIFFERENTIAL/PLATELET
BASOS PCT: 0.5 % (ref 0.0–3.0)
Basophils Absolute: 0 10*3/uL (ref 0.0–0.1)
EOS ABS: 0.1 10*3/uL (ref 0.0–0.7)
Eosinophils Relative: 1 % (ref 0.0–5.0)
HCT: 43.3 % (ref 36.0–46.0)
Hemoglobin: 14.7 g/dL (ref 12.0–15.0)
LYMPHS ABS: 1.5 10*3/uL (ref 0.7–4.0)
Lymphocytes Relative: 19 % (ref 12.0–46.0)
MCHC: 33.9 g/dL (ref 30.0–36.0)
MCV: 83 fl (ref 78.0–100.0)
MONO ABS: 0.4 10*3/uL (ref 0.1–1.0)
Monocytes Relative: 5.4 % (ref 3.0–12.0)
NEUTROS ABS: 5.8 10*3/uL (ref 1.4–7.7)
NEUTROS PCT: 74.1 % (ref 43.0–77.0)
PLATELETS: 343 10*3/uL (ref 150.0–400.0)
RBC: 5.23 Mil/uL — ABNORMAL HIGH (ref 3.87–5.11)
RDW: 13.7 % (ref 11.5–15.5)
WBC: 7.9 10*3/uL (ref 4.0–10.5)

## 2018-10-26 LAB — LIPID PANEL
CHOL/HDL RATIO: 3
CHOLESTEROL: 169 mg/dL (ref 0–200)
HDL: 56 mg/dL (ref >39.00)
LDL Cholesterol: 94 mg/dL (ref 0–99)
NonHDL: 112.6
Triglycerides: 92 mg/dL (ref 0.0–149.0)
VLDL: 18.4 mg/dL (ref 0.0–40.0)

## 2018-10-26 LAB — COMPREHENSIVE METABOLIC PANEL
ALBUMIN: 4.3 g/dL (ref 3.5–5.2)
ALT: 10 U/L (ref 0–35)
AST: 17 U/L (ref 0–37)
Alkaline Phosphatase: 84 U/L (ref 39–117)
BUN: 16 mg/dL (ref 6–23)
CHLORIDE: 105 meq/L (ref 96–112)
CO2: 28 mEq/L (ref 19–32)
CREATININE: 0.76 mg/dL (ref 0.40–1.20)
Calcium: 9.5 mg/dL (ref 8.4–10.5)
GFR: 75.42 mL/min (ref >60.00)
Glucose, Bld: 102 mg/dL — ABNORMAL HIGH (ref 70–99)
POTASSIUM: 4.7 meq/L (ref 3.5–5.1)
SODIUM: 141 meq/L (ref 135–145)
Total Bilirubin: 0.5 mg/dL (ref 0.2–1.2)
Total Protein: 6.7 g/dL (ref 6.0–8.3)

## 2018-10-26 LAB — HEMOGLOBIN A1C: Hgb A1c MFr Bld: 5.5 % (ref 4.6–6.5)

## 2018-10-26 LAB — VITAMIN D 25 HYDROXY (VIT D DEFICIENCY, FRACTURES): VITD: 20.77 ng/mL — AB (ref 30.00–100.00)

## 2018-10-26 MED ORDER — CHOLECALCIFEROL 1.25 MG (50000 UT) PO TABS: ORAL_TABLET | ORAL | 0 refills | Status: DC

## 2018-10-26 NOTE — Progress Notes (Signed)
Phone [phone]   Subjective:  [name] is a 68 y.o. year old very pleasant female patient who presents for/with See problem oriented charting ROS-continued issues with back pain.  No fever, chills.  Stable shortness of breath.  No chest pain.  No edema  Past Medical History-  Patient Active Problem List   Diagnosis Date Noted  . Coronary artery calcification 10/31/2012    Priority: High  . Elevated blood pressure 03/13/2015    Priority: Medium  . Osteoporosis 09/05/2014    Priority: Medium  . Chronic low back pain 09/05/2014    Priority: Medium  . COPD, frequent exacerbations ([place]) 03/29/2011    Priority: Medium  . COPD (chronic obstructive pulmonary disease) ([place]) 06/06/2009    Priority: Medium  . Encounter for follow-up surveillance of lung cancer 01/05/2009    Priority: Medium  . Hyperlipidemia 06/29/2006    Priority: Medium  . History of adenomatous polyp of colon 04/08/2018    Priority: Low  . Ganglion cyst of finger of right hand 09/23/2016    Priority: Low  . Former smoker 03/13/2015    Priority: Low  . Pulmonary nodule, right 03/29/2011    Priority: Low  . Acute non-recurrent sinusitis 02/20/2017    Medications- reviewed and updated Current Outpatient Medications  Medication Sig Dispense Refill  . albuterol (PROAIR HFA) 108 (90 Base) MCG/ACT inhaler Inhale 1-2 puffs into the lungs every 6 (six) hours as needed for wheezing or shortness of breath. Reported on 02/21/2016 1 Inhaler 5  . alendronate (FOSAMAX) 70 MG tablet TAKE 1 TABLET BY MOUTH EVERY 7 DAYS WITH A FULL GLASS OF WATER AND ON AN EMPTY STOMACH 8 tablet 4  . ANORO ELLIPTA 62.5-25 MCG/INH AEPB INHALE 1 PUFF INTO THE LUNGS DAILY 60 each 5  . aspirin EC 81 MG tablet Take 1 tablet (81 mg total) by mouth daily.    Marland Kitchen atorvastatin (LIPITOR) 40 MG tablet TAKE 1 TABLET(40 MG) BY MOUTH DAILY 90 tablet 1  . meloxicam (MOBIC) 15 MG tablet Take 1 tablet (15 mg total) by mouth daily. 30 tablet 3  .  oxyCODONE-acetaminophen (ROXICET) 5-325 MG per tablet Take 0.5-1 tablets by mouth every 8 (eight) hours as needed for severe pain. 60 tablet 0  . Cholecalciferol 1.25 MG (50000 UT) TABS 50,000 units PO qwk for 12 weeks. 12 tablet 0   No current facility-administered medications for this visit.      Objective:  BP 130/82 (BP Location: Left Arm, Patient Position: Sitting, Cuff Size: Normal)   Pulse 86   Temp 98.4 F (36.9 C) (Oral)   Ht 5\' 3"  (1.6 m)   Wt 144 lb 6.4 oz (65.5 kg)   SpO2 98%   BMI 25.58 kg/m  Gen: NAD, resting comfortably CV: RRR no murmurs rubs or gallops Lungs: CTAB no crackles, wheeze, rhonchi, prolonged expiratory phase Abdomen: soft/nontender/nondistended/normal bowel sounds.  Ext: no edema Skin: warm, dry Neuro: Normal gait and speech    Assessment and Plan    #Osteoporosis S: Patient with known osteoporosis.  She has been on Fosamax since September-she was discouraged that bone density was not improved last check.  She does admit that she stopped taking her vitamin D A/P: Largely stable/slight worsening-recommended boost of vitamin D since this was found to be low today.  Recommended calcium 1200 mg/day.  Continue Fosamax.  Repeat bone density likely 2022-possibly changed to Prolia at that point  #COPD/emphysema S: Compliant with Anoro.  Using albuterol - very sparingly A/P:  Stable.  Continue current medications.     #Hyperlipidemia/history of coronary artery calcification-has seen Dr. Marigene Ehlers in the past S: Compliant with atorvastatin 40 mg. A/P:  Stable. Continue current medications.   LDL at goal under 100 today  #Chronic low back pain S: Followed with Dr. Carloyn Manner- he retired 2020- going to be with Dr. Sherley Bounds in [place].  Percocet through Dr. Carloyn Manner in past.  We prescribed Mobic.  We will follow kidney function at least every 6 months A/P: Appears to be stable- continue to monitor kidney function given long-term meloxicam  Lab/Order  associations: Hyperglycemia - Plan: Hemoglobin A1c  Hyperlipidemia, unspecified hyperlipidemia type - Plan: CBC with Differential/Platelet, Comprehensive metabolic panel, Lipid panel  Age-related osteoporosis without current pathological fracture - Plan: VITAMIN D 25 Hydroxy (Vit-D Deficiency, Fractures)  Meds ordered this encounter  Medications  . Cholecalciferol 1.25 MG (50000 UT) TABS    Sig: 50,000 units PO qwk for 12 weeks.    Dispense:  12 tablet    Refill:  0    Return precautions advised.  Garret Reddish, MD

## 2018-10-26 NOTE — Assessment & Plan Note (Signed)
S: Patient with known osteoporosis.  She has been on Fosamax since September-she was discouraged that bone density was not improved last check.  She does admit that she stopped taking her vitamin D A/P: Largely stable/slight worsening-recommended boost of vitamin D since this was found to be low today.  Recommended calcium 1200 mg/day.  Continue Fosamax.  Repeat bone density likely 2022-possibly changed to Prolia at that point

## 2018-10-26 NOTE — Patient Instructions (Addendum)
Schedule physical in 6-12 months at check out  On fosamax- need a minimum of 1200mg  of calcium through diet and pills and 1000 units of vitamin D. We may need to boost your vitamin D   Please stop by lab before you go If you do not have mychart- we will call you about results within 5 business days of [place] receiving them.  If you have mychart- we will send your results within 3 business days of [place] receiving them.  If abnormal or we want to clarify a result, we will call or mychart you to make sure you receive the message.  If you have questions or concerns or don't hear within 5-7 days, please send [place] a message or call us.

## 2018-10-27 ENCOUNTER — Encounter: Payer: Self-pay | Admitting: Family Medicine

## 2019-01-06 ENCOUNTER — Other Ambulatory Visit: Payer: Self-pay | Admitting: Family Medicine

## 2019-01-06 NOTE — Telephone Encounter (Signed)
Last OV 10/26/18 Last refill 05/26/18 #90/1 Next OV not scheduled

## 2019-02-08 ENCOUNTER — Other Ambulatory Visit: Payer: Self-pay | Admitting: Family Medicine

## 2019-03-03 MED ORDER — ANORO ELLIPTA 62.5-25 MCG/INH IN AEPB: 1.0000 | INHALATION_SPRAY | Freq: Every day | RESPIRATORY_TRACT | 5 refills | Status: DC

## 2019-03-03 NOTE — Telephone Encounter (Signed)
Patient wondering when she might be called to set up her CT. She thought it was in July but I let her know it was expected in August. Are you all still calling a month ahead? I sent her an email back stating I would touch base with the [facility] and let her know.

## 2019-03-25 ENCOUNTER — Other Ambulatory Visit: Payer: Self-pay | Admitting: Family Medicine

## 2019-04-13 ENCOUNTER — Ambulatory Visit (INDEPENDENT_AMBULATORY_CARE_PROVIDER_SITE_OTHER)
Admission: RE | Admit: 2019-04-13 | Discharge: 2019-04-13 | Disposition: A | Discharge status: Home / Self Care | Payer: Medicare Other | Source: Ambulatory Visit | Attending: Acute Care | Admitting: Acute Care

## 2019-04-13 ENCOUNTER — Inpatient Hospital Stay: Admission: RE | Admit: 2019-04-13 | Payer: Medicare Other | Source: Ambulatory Visit

## 2019-04-13 ENCOUNTER — Other Ambulatory Visit: Payer: Self-pay

## 2019-04-13 DIAGNOSIS — Z122 Encounter for screening for malignant neoplasm of respiratory organs: Secondary | ICD-10-CM

## 2019-04-13 DIAGNOSIS — F17210 Nicotine dependence, cigarettes, uncomplicated: Secondary | ICD-10-CM | POA: Diagnosis not present

## 2019-04-15 ENCOUNTER — Telehealth: Payer: Self-pay | Admitting: Family Medicine

## 2019-04-15 NOTE — Telephone Encounter (Signed)
I left a message asking the patient to call me at [phone] to schedule AWV with Courtney. VDM (Dee-Dee) °

## 2019-04-16 ENCOUNTER — Other Ambulatory Visit: Payer: Self-pay | Admitting: *Deleted

## 2019-04-16 DIAGNOSIS — Z122 Encounter for screening for malignant neoplasm of respiratory organs: Secondary | ICD-10-CM

## 2019-04-16 DIAGNOSIS — Z87891 Personal history of nicotine dependence: Secondary | ICD-10-CM

## 2019-04-19 ENCOUNTER — Ambulatory Visit (INDEPENDENT_AMBULATORY_CARE_PROVIDER_SITE_OTHER): Payer: Medicare Other | Admitting: Acute Care

## 2019-04-19 ENCOUNTER — Other Ambulatory Visit: Payer: Self-pay

## 2019-04-19 ENCOUNTER — Encounter: Payer: Self-pay | Admitting: Acute Care

## 2019-04-19 DIAGNOSIS — Z85118 Personal history of other malignant neoplasm of bronchus and lung: Secondary | ICD-10-CM

## 2019-04-19 DIAGNOSIS — J449 Chronic obstructive pulmonary disease, unspecified: Secondary | ICD-10-CM | POA: Diagnosis not present

## 2019-04-19 DIAGNOSIS — Z08 Encounter for follow-up examination after completed treatment for malignant neoplasm: Secondary | ICD-10-CM

## 2019-04-19 DIAGNOSIS — Z0000 Encounter for general adult medical examination without abnormal findings: Secondary | ICD-10-CM

## 2019-04-19 NOTE — Progress Notes (Signed)
Virtual Visit via Telephone Note  I connected with [name] on 04/19/19 at 11:00 AM EDT by telephone and verified that I am speaking with the correct person using two identifiers.  I  confirmed date of birth and address to authenticate patient   Identity. My nurse Quentin Ore reviewed medications and ordered any refills required.  Location: Patient: At home Provider: In the office at [address] [place], [place], [address]   I discussed the limitations, risks, security and privacy concerns of performing an evaluation and management service by telephone and the availability of in person appointments. I also discussed with the patient that there may be a patient responsible charge related to this service. The patient expressed understanding and agreed to proceed.  [name] is a 68 y.o. female former smoker , quit 2010 with a 40 pack year  smoking history with COPD and a history of stage 1 lung cancer treated with surgery in 2010, she is followed by Dr. Chase Caller.She is also followed through the lung screening program.   History of Present Illness: Pt.presents for follow up. She was last seen in the office 04/06/2018 with Dr. Chase Caller for COPD. She presents today for her yearly follow up. She states she has been doing well. She is compliant with her Anoro as maintenance and her [employer] as rescue. She states she is using her rescue inhaler once in a blue moon.She has not had a COPD flare in 12 months. She has noticed that she does have some increased shortness of breath with activity. She states she thinks it is just weather related due to humidity.Last spirometry was 2015.  Her most recent Lung Cancer Screening was read as a Lung RADS 2: nodules that are benign in appearance and behavior with a very low likelihood of becoming a clinically active cancer due to size or lack of growth. Recommendation per radiology is for a repeat LDCT in 12 months.We have reviewed her LDCT  results. She can be screened for an additional 5 years through the screening program. She denies fever, chest pain, orthopnea or hemoptysis.   Observations/Objective: CAT COPD Symptom & Quality of Life Score ([place]) 0 is no burden. 5 is highest burden 02/20/2017  04/06/2018   Never Cough -> Cough all the time 1 1  No phlegm in chest -> Chest is full of phlegm 0 0  No chest tightness -> Chest feels very tight 0 0  No dyspnea for 1 flight stairs/hill -> Very dyspneic for 1 flight of stairs 0 3  No limitations for ADL at home -> Very limited with ADL at home 0 0  Confident leaving home -> Not at all confident leaving home 1 0  Sleep soundly -> Do not sleep soundly because of lung condition 0 0  Lots of Energy -> No energy at all 4 0  TOTAL Score (max 40)  7 4   04/19/2019  CAT Score is 5  LDCT 04/13/2019 Cardiovascular: The heart is normal in size. No pericardial effusion. No evidence of thoracic aortic aneurysm. Atherosclerotic calcifications of the aortic arch. Coronary atherosclerosis of the LAD and right coronary artery Mediastinum/Nodes: No suspicious mediastinal lymphadenopathy. Visualized thyroid is unremarkable. Lungs/Pleura: Prior left upper lobectomy. Right apical pleural-parenchymal scarring. Mild centrilobular emphysematous changes in the right upper lobe. No focal consolidation. 2.8 mm solid nodule in the right lung apex. Ground-glass nodules in the lateral right upper lobe and left lower lobe measuring up to 5.6 mm, unchanged. No  pleural effusion or pneumothorax. Upper Abdomen: Visualized upper abdomen is notable for prior cholecystectomy, vascular calcifications, and right hepatic cysts. Musculoskeletal: Mild degenerative changes of the visualized thoracolumbar spine. IMPRESSION: Lung-RADS 2, benign appearance or behavior. Continue annual screening with low-dose chest CT without contrast in 12 months.     IMPRESSION:01/2016 1. Previously noted  right upper lobe nodules are unchanged, and considered benign. No follow-up recommended. This recommendation follows the consensus statement: Guidelines for Management of Incidental Pulmonary Nodules Detected on CT Images:From the Fleischner Society 2017; published online before print (10.1148/radiol.[id number]). 2. Mild diffuse bronchial wall thickening with mild centrilobular and paraseptal emphysema; imaging findings suggestive of underlying COPD. 3. Atherosclerosis, including left main and 2 vessel coronary artery disease. Please note that although the presence of coronary artery calcium documents the presence of coronary artery disease, the severity of this disease and any potential stenosis cannot be assessed on this non-gated CT examination. Assessment for potential risk factor modification, dietary therapy or pharmacologic therapy   Spirometry 2015 was normal >> No PFT's since  Assessment and Plan: COPD Stable interval Compliant with Anoro Some increased shortness of breath with activity  Plan Continue using your Anoro 1 puff once daily as you have been doing Rinse mouth after use Continue using your ProAir rescue inhaler as you have been doing for breakthrough shortness of breath up to 3 times daily. If you find you are having to use your rescue inhaler with increased frequency please call to be seen. We will schedule you for a PFT to assess your breathing function. You will get a call from the office to schedule.  They will also let you know where to go for COVID testing Note your daily symptoms > remember "red flags" for COPD:  Increase in cough, increase in sputum production, increase in shortness of breath or activity intolerance. If you notice these symptoms, please call to be seen.   If PFT's are normal, we will refer to cards Follow up in 2 months after PFT's with Judson Roch, NP to review Please contact office for sooner follow up if symptoms do not improve or worsen  or seek emergency care Seek emergency care for any chest pain.   Tobacco Abuse Previous Hx Lung Cancer with lobectomy 2010 LDCT 03/2019>> Lung RADS 2: nodules that are benign in appearance and behavior with a very low likelihood of becoming a clinically active cancer due to size or lack of growth. Recommendation per radiology is for a repeat LDCT in 12 months. Plan Annual Lung Cancer Screening 03/2020  Health Maintenance Plan Flu shot  Follow Up Instructions:  Follow up in 2 months after PFT's Please contact office for sooner follow up if symptoms do not improve or worsen or seek emergency care  I discussed the assessment and treatment plan with the patient. The patient was provided an opportunity to ask questions and all were answered. The patient agreed with the plan and demonstrated an understanding of the instructions.   The patient was advised to call back or seek an in-person evaluation if the symptoms worsen or if the condition fails to improve as anticipated.  I provided 23 minutes of non-face-to-face time during this encounter.  This appointment was over 20 minutes long with over 50% of the time being direct face to face patient care, assessment , plan of care , and follow up,    Magdalen Spatz, NP

## 2019-04-19 NOTE — Patient Instructions (Addendum)
It is good to speak with you today. Please get your flu vaccine at [facility] as you do every year. Continue using your Anoro 1 puff once daily as you have been doing Rinse mouth after use Continue using your ProAir rescue inhaler as you have been doing for breakthrough shortness of breath up to 3 times daily. If you find you are having to use your rescue inhaler with increased frequency please call to be seen. We will schedule you for a PFT to assess your breathing function. You will get a call from the office to schedule.  They will also let you know where to go for COVID testing  Note your daily symptoms > remember "red flags" for COPD:  Increase in cough, increase in sputum production, increase in shortness of breath or activity intolerance. If you notice these symptoms, please call to be seen.

## 2019-04-22 ENCOUNTER — Other Ambulatory Visit: Payer: Self-pay | Admitting: Family Medicine

## 2019-04-22 NOTE — Telephone Encounter (Signed)
May refill but please change to daily prn

## 2019-04-22 NOTE — Telephone Encounter (Signed)
Last OV 10/26/18 Last refill 09/16/13 #30/3 not previously prescribed by Dr. Yong Channel Next OV not scheduled  Called and spoke with patient, she takes this prn for arthritis in her back. Not taking daily.   Forwarding to Dr. Yong Channel

## 2019-07-06 ENCOUNTER — Other Ambulatory Visit: Payer: Self-pay

## 2019-07-06 MED ORDER — ATORVASTATIN CALCIUM 40 MG PO TABS: ORAL_TABLET | ORAL | 3 refills | Status: DC

## 2019-07-29 ENCOUNTER — Other Ambulatory Visit: Payer: Self-pay

## 2019-07-29 DIAGNOSIS — Z20822 Contact with and (suspected) exposure to covid-19: Secondary | ICD-10-CM

## 2019-07-29 DIAGNOSIS — Z20828 Contact with and (suspected) exposure to other viral communicable diseases: Secondary | ICD-10-CM

## 2019-07-30 LAB — NOVEL CORONAVIRUS, NAA: SARS-CoV-2, NAA: DETECTED — AB

## 2019-08-01 ENCOUNTER — Telehealth: Payer: Self-pay | Admitting: Unknown Physician Specialty

## 2019-08-01 NOTE — Telephone Encounter (Signed)
Called to discuss with patient about Covid symptoms and the use of bamlanivimab, a monoclonal antibody infusion for those with mild to moderate Covid symptoms and at a high risk of hospitalization.  Pt is qualified for this infusion at the [facility] infusion center due to Age > 13 which were addressed with the patient and are actively being managed by a [facility] provider.    After discussing the infusion's costs, potential benefits and side effects, the patient has decided to think about treatment with monoclonal antibodies.

## 2019-08-02 ENCOUNTER — Other Ambulatory Visit: Payer: Self-pay | Admitting: Unknown Physician Specialty

## 2019-08-02 ENCOUNTER — Telehealth: Payer: Self-pay | Admitting: Unknown Physician Specialty

## 2019-08-02 DIAGNOSIS — U071 COVID-19: Secondary | ICD-10-CM

## 2019-08-02 NOTE — Telephone Encounter (Signed)
  I connected by phone with [name] on 08/02/2019 at 9:48 AM to discuss the potential use of an new treatment for mild to moderate COVID-19 viral infection in non-hospitalized patients.  This patient is a 68 y.o. female that meets the FDA criteria for Emergency Use Authorization of bamlanivimab or casirivimab\imdevimab.  Has a (+) direct SARS-CoV-2 viral test result  Has mild or moderate COVID-19   Is ? 69 years of age and weighs ? 40 kg  Is NOT hospitalized due to COVID-19  Is NOT requiring oxygen therapy or requiring an increase in baseline oxygen flow rate due to COVID-19  Is within 10 days of symptom onset  Has at least one of the high risk factor(s) for progression to severe COVID-19 and/or hospitalization as defined in EUA.  Specific high risk criteria : >/= 69 yo   I have spoken and communicated the following to the patient or parent/caregiver:  1. FDA has authorized the emergency use of bamlanivimab and casirivimab\imdevimab for the treatment of mild to moderate COVID-19 in adults and pediatric patients with positive results of direct SARS-CoV-2 viral testing who are 78 years of age and older weighing at least 40 kg, and who are at high risk for progressing to severe COVID-19 and/or hospitalization.  2. The significant known and potential risks and benefits of bamlanivimab and casirivimab\imdevimab, and the extent to which such potential risks and benefits are unknown.  3. Information on available alternative treatments and the risks and benefits of those alternatives, including clinical trials.  4. Patients treated with bamlanivimab and casirivimab\imdevimab should continue to self-isolate and use infection control measures (e.g., wear mask, isolate, social distance, avoid sharing personal items, clean and disinfect "high touch" surfaces, and frequent handwashing) according to CDC guidelines.   5. The patient or parent/caregiver has the option to accept or refuse  bamlanivimab or casirivimab\imdevimab .  After reviewing this information with the patient, The patient agreed to proceed with receiving the bamlanimivab infusion and will be provided a copy of the Fact sheet prior to receiving the infusion.[name] 08/02/2019 9:48 AM

## 2019-08-03 ENCOUNTER — Ambulatory Visit (HOSPITAL_COMMUNITY)
Admission: RE | Admit: 2019-08-03 | Discharge: 2019-08-03 | Disposition: A | Discharge status: Home / Self Care | Payer: Medicare Other | Source: Ambulatory Visit | Attending: Pulmonary Disease | Admitting: Pulmonary Disease

## 2019-08-03 DIAGNOSIS — U071 COVID-19: Secondary | ICD-10-CM | POA: Insufficient documentation

## 2019-08-03 MED ORDER — METHYLPREDNISOLONE SODIUM SUCC 125 MG IJ SOLR: 125.0000 mg | Freq: Once | INTRAMUSCULAR | Status: DC | PRN

## 2019-08-03 MED ORDER — EPINEPHRINE 0.3 MG/0.3ML IJ SOAJ: 0.3000 mg | Freq: Once | INTRAMUSCULAR | Status: DC | PRN

## 2019-08-03 MED ORDER — ALBUTEROL SULFATE HFA 108 (90 BASE) MCG/ACT IN AERS: 2.0000 | INHALATION_SPRAY | Freq: Once | RESPIRATORY_TRACT | Status: DC | PRN

## 2019-08-03 MED ORDER — DIPHENHYDRAMINE HCL 50 MG/ML IJ SOLN: 50.0000 mg | Freq: Once | INTRAMUSCULAR | Status: DC | PRN

## 2019-08-03 MED ORDER — SODIUM CHLORIDE 0.9 % IV SOLN
700.0000 mg | Freq: Once | INTRAVENOUS | Status: AC
  Administered 2019-08-03: 14:00:00 700 mg via INTRAVENOUS
  Filled 2019-08-03: qty 20

## 2019-08-03 MED ORDER — SODIUM CHLORIDE 0.9 % IV SOLN
INTRAVENOUS | Status: DC | PRN
  Administered 2019-08-03: 250 mL via INTRAVENOUS

## 2019-08-03 MED ORDER — FAMOTIDINE IN NACL 20-0.9 MG/50ML-% IV SOLN: 20.0000 mg | Freq: Once | INTRAVENOUS | Status: DC | PRN

## 2019-08-04 ENCOUNTER — Telehealth: Payer: Self-pay | Admitting: Internal Medicine

## 2019-08-04 NOTE — Telephone Encounter (Signed)
Call from daughter - > that [name] and her husband got covid-19 - both got infusion MAb yesterday. Last night fever was rough for husband post infusion. All along had fever. NOw after infusion both better in terms of fever  Advised them to hydrate and rest and monitor oxygenation status

## 2019-08-06 ENCOUNTER — Telehealth: Payer: Self-pay | Admitting: Internal Medicine

## 2019-08-06 MED ORDER — PREDNISONE 10 MG PO TABS: ORAL_TABLET | ORAL | 0 refills | Status: DC

## 2019-08-06 NOTE — Telephone Encounter (Signed)
Spoke with patient. She is aware of the RX and instructions. Pharmacy is [facility] in [place].   Nothing further needed at time of call.

## 2019-08-06 NOTE — Telephone Encounter (Signed)
She has copd and is s/p cvovid. Got email saying she is post infusion Mab (outpatient) and though better is having cough. Wants pred  Plan  - Please take prednisone 40 mg x1 day, then 30 mg x1 day, then 20 mg x1 day, then 10 mg x1 day, and then 5 mg x1 day and stop   - let her kjnow

## 2019-09-07 ENCOUNTER — Telehealth: Payer: Self-pay | Admitting: Internal Medicine

## 2019-09-07 NOTE — Telephone Encounter (Signed)
Kathy Breach Sent me a message through her daughter who is an Glass blower/designer at [employer].  According to the daughter patient has Anoro and because of an insurance change Stiolto Respimat is what is preferred.  She only has 6 days of Anoro left.  [facility] [place] on [address]  This can be the lower dose 2 puffs once daily

## 2019-09-10 MED ORDER — STIOLTO RESPIMAT 2.5-2.5 MCG/ACT IN AERS: 2.0000 | INHALATION_SPRAY | Freq: Every day | RESPIRATORY_TRACT | 5 refills | Status: DC

## 2019-09-10 NOTE — Telephone Encounter (Signed)
Pt is still waiting on a RX for [place] [facility] [place] on [address] - please advise pt - she is about to run out of meds CB# [phone]

## 2019-09-10 NOTE — Telephone Encounter (Signed)
Message was sent directly to [facility] and she has been out of the office. I apologized to the patient for the delay and made her aware that I have sent over the script for Stiolto to requested pharmacy.

## 2019-11-03 ENCOUNTER — Other Ambulatory Visit: Payer: Self-pay | Admitting: Family Medicine

## 2019-11-22 ENCOUNTER — Telehealth: Payer: Self-pay | Admitting: Family Medicine

## 2019-11-22 NOTE — Telephone Encounter (Signed)
I left a message asking the patient to call and schedule Medicare AWV-I with Loma Sousa ([place]) and CPE w/ Dr. Yong Channel.  If patient calls back, please schedule both at next available opening. VDM (Dee-Dee)

## 2019-12-06 ENCOUNTER — Other Ambulatory Visit: Payer: Self-pay | Admitting: Family Medicine

## 2019-12-07 ENCOUNTER — Other Ambulatory Visit: Payer: Self-pay | Admitting: Family Medicine

## 2019-12-28 ENCOUNTER — Other Ambulatory Visit: Payer: Self-pay

## 2019-12-28 ENCOUNTER — Telehealth: Payer: Self-pay

## 2019-12-28 ENCOUNTER — Ambulatory Visit
Admission: EM | Admit: 2019-12-28 | Discharge: 2019-12-28 | Disposition: A | Discharge status: Home / Self Care | Payer: Medicare PPO | Attending: Emergency Medicine | Admitting: Emergency Medicine

## 2019-12-28 DIAGNOSIS — R079 Chest pain, unspecified: Secondary | ICD-10-CM | POA: Diagnosis not present

## 2019-12-28 MED ORDER — HYDROXYZINE HCL 10 MG PO TABS: 10.0000 mg | ORAL_TABLET | Freq: Every day | ORAL | 0 refills | Status: DC

## 2019-12-28 NOTE — Telephone Encounter (Signed)
Spoke with patient and she refused going to the ER.

## 2019-12-28 NOTE — Telephone Encounter (Signed)
Patient was triage for chest pain that comes and goes. Triage nurse would like for patient to be seen within 24 hours. Patient only want to see Dr. Yong Channel.Patient also states that she can't come before 2pm tomorrow. Can we schedule patient in  SDA slot for sometime this week?

## 2019-12-28 NOTE — Telephone Encounter (Signed)
Nurse Assessment Nurse: Hardin Negus, RN, Danae Chen Date/Time Eilene Ghazi Time): 12/28/2019 11:20:32 AM Confirm and document reason for call. If symptomatic, describe symptoms. ---Caller states she is having chest pain that is coming and going. No difficulty breathing. She thinks it is stress, her son recently passed. Wants to see Dr. Yong Channel. Chest pain started a couple of days after her son passed- not currently having any pain, she did have some last night. Has the patient had close contact with a person known or suspected to have the novel coronavirus illness OR traveled / lives in area with major community spread (including international travel) in the last 14 days from the onset of symptoms? * If Asymptomatic, screen for exposure and travel within the last 14 days. ---No Does the patient have any new or worsening symptoms? ---Yes Will a triage be completed? ---Yes Related visit to physician within the last 2 weeks? ---No Does the PT have any chronic conditions? (i.e. diabetes, asthma, this includes High risk factors for pregnancy, etc.) ---Yes List chronic conditions. ---COPD Is this a behavioral health or substance abuse call? ---No Guidelines Guideline Title Affirmed Question Affirmed Notes Nurse Date/Time Eilene Ghazi Time) Chest Pain [1] Chest pain lasting < 5 minutes AND [2] NO chest pain or cardiac Hermine Messick 12/28/2019 11:24:24 AMPLEASE NOTE: All timestamps contained within this report are represented as [place] Standard Time. CONFIDENTIALTY NOTICE: This fax transmission is intended only for the addressee. It contains information that is legally privileged, confidential or otherwise protected from use or disclosure. If you are not the intended recipient, you are strictly prohibited from reviewing, disclosing, copying using or disseminating any of this information or taking any action in reliance on or regarding this information. If you have received this fax in error, please  notify us immediately by telephone so that we can arrange for its return to [place]. Phone: [phone], Toll-Free: [phone], Fax: [phone] Page: 2 of 2 Call Id: [id number] Guidelines Guideline Title Affirmed Question Affirmed Notes Nurse Date/Time Eilene Ghazi Time) symptoms (e.g., breathing difficulty, sweating) now (Exception: chest pains that last only a few seconds) Disp. Time Eilene Ghazi Time) Disposition Final User 12/28/2019 11:19:08 AM Send to Urgent Queue Rosana Fret 12/28/2019 11:33:40 AM See PCP within 24 Hours Yes Hardin Negus, RN, Danae Chen Caller Disagree/Comply Comply Caller Understands Yes PreDisposition Call Doctor Care Advice Given Per Guideline SEE PCP WITHIN 24 HOURS: * IF OFFICE WILL BE OPEN: You need to be seen within the next 24 hours. Call your doctor (or NP/PA) when the office opens and make an appointment. CALL BACK IF: * Difficulty breathing occurs * Chest pain increases in frequency, duration or severity * Chest pain lasts over 5 minutes * You become worse. CARE ADVICE given per Chest Pain (Adult) guideline.

## 2019-12-28 NOTE — Telephone Encounter (Signed)
Spoke with pt and pt agreed to be evaluated at Urgent Care since she did not want to go to the ER.

## 2019-12-28 NOTE — ED Triage Notes (Signed)
Pt presents with c/o chest pain that has been intermittent since death of son on 2022-12-17  . Pt states pain is sharp

## 2019-12-28 NOTE — ED Provider Notes (Signed)
[facility]   [id number] 12/28/19 Arrival Time: 1324   CC: CHEST PAIN  SUBJECTIVE:  [name] is a 69 y.o. female hx significant for OCPD, depression, HLD, previous lung CA (in remission), and OP, who presents with complaint of intermittent sharp chest pain x 7-10 days.  Denies a precipitating event, trauma, recent lower respiratory tract, or strenuous upper body activities.  States son passed away on Jan 04, 2020.  Localizes chest pain to the substernal region and LT side of chest.  Describes as stable, that is intermittent and sharp, and as if her heart is "being chiseled" in character.  Currently asymptomatic.  Has NOT tried OTC medications.  Denies aggravating factors.  Denies radiating symptoms.  Denies previous symptoms in the past.  Complains of associated anxiety.  Denies fever, chills, lightheadedness, dizziness, palpitations, tachycardia, SOB, nausea, vomiting, abdominal pain, changes in bowel or bladder habits, diaphoresis, numbness/tingling in extremities, peripheral edema.    Denies SOB, calf pain or swelling, recent long travel, recent surgery, tobacco use, hormone use, or previous blood clot  ROS: As per HPI.  All other pertinent ROS negative.    Past Medical History:  Diagnosis Date  . Complication of anesthesia    per pt, "hard to wake up past sedation".  . COPD (chronic obstructive pulmonary disease) ([place])   . Depression    previous rx, not in years  . Hyperlipidemia   . Lung cancer ([place]) 2010   Left upper lung- lobectomy  . Osteoporosis    Past Surgical History:  Procedure Laterality Date  . CHOLECYSTECTOMY    . LUNG CANCER SURGERY  2010   Left upper  . VAGINAL HYSTERECTOMY     including ovaries and fallopian tubes   Allergies  Allergen Reactions  . Codeine     REACTION: nausea  . Penicillins     REACTION: rash   No current facility-administered medications on file prior to encounter.   Current Outpatient Medications on File Prior to  Encounter  Medication Sig Dispense Refill  . albuterol (PROAIR HFA) 108 (90 Base) MCG/ACT inhaler Inhale 1-2 puffs into the lungs every 6 (six) hours as needed for wheezing or shortness of breath. Reported on 02/21/2016 1 Inhaler 5  . alendronate (FOSAMAX) 70 MG tablet TAKE 1 TABLET BY MOUTH EVERY 7 DAYS ON AN EMPTY STOMACH AND WITH A FULL GLASS OF WATER 8 tablet 0  . aspirin EC 81 MG tablet Take 1 tablet (81 mg total) by mouth daily.    Marland Kitchen atorvastatin (LIPITOR) 40 MG tablet TAKE 1 TABLET(40 MG) BY MOUTH DAILY 90 tablet 3  . Cholecalciferol 1.25 MG (50000 UT) TABS 50,000 units PO qwk for 12 weeks. 12 tablet 0  . meloxicam (MOBIC) 15 MG tablet TAKE 1 TABLET BY MOUTH DAILY AS NEEDED 30 tablet 2  . oxyCODONE-acetaminophen (ROXICET) 5-325 MG per tablet Take 0.5-1 tablets by mouth every 8 (eight) hours as needed for severe pain. 60 tablet 0  . predniSONE (DELTASONE) 10 MG tablet 4tabs x1day, 3tabs x1day, 2tabs x1day, 1tab x1day, 1/2tab x1day then STOP 11 tablet 0  . Tiotropium Bromide-Olodaterol (STIOLTO RESPIMAT) 2.5-2.5 MCG/ACT AERS Inhale 2 puffs into the lungs daily. 4 g 5  . umeclidinium-vilanterol (ANORO ELLIPTA) 62.5-25 MCG/INH AEPB Inhale 1 puff into the lungs daily. 60 each 5   Social History   Socioeconomic History  . Marital status: Married    Spouse name: Not on file  . Number of children: Not on file  . Years of education: Not on  file  . Highest education level: Not on file  Occupational History  . Not on file  Tobacco Use  . Smoking status: Former Smoker    Packs/day: 1.00    Years: 40.00    Pack years: 40.00    Types: Cigarettes    Quit date: 12/17/2008    Years since quitting: 11.0  . Smokeless tobacco: Never Used  Substance and Sexual Activity  . Alcohol use: No  . Drug use: No  . Sexual activity: Not on file  Other Topics Concern  . Not on file  Social History Narrative   Married (husband Truman Hayward also Dr. Yong Channel patient) [phone]. 3 children. 3 grandchildren.       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       Hobbies: shopping (husband will go with her) for grandkids.    Social Determinants of Health   Financial Resource Strain:   . Difficulty of Paying Living Expenses:   Food Insecurity:   . Worried About Charity fundraiser in the Last Year:   . Arboriculturist in the Last Year:   Transportation Needs:   . Film/video editor (Medical):   Marland Kitchen Lack of Transportation (Non-Medical):   Physical Activity:   . Days of Exercise per Week:   . Minutes of Exercise per Session:   Stress:   . Feeling of Stress :   Social Connections:   . Frequency of Communication with Friends and Family:   . Frequency of Social Gatherings with Friends and Family:   . Attends Religious Services:   . Active Member of Clubs or Organizations:   . Attends Archivist Meetings:   Marland Kitchen Marital Status:   Intimate Partner Violence:   . Fear of Current or Ex-Partner:   . Emotionally Abused:   Marland Kitchen Physically Abused:   . Sexually Abused:    Family History  Problem Relation Age of Onset  . Epilepsy Mother        hurt at birth, birth trauma reltaed  . Hypertension Father   . CVA Father        hemorrhagic  . Breast cancer Neg Hx      OBJECTIVE:  Vitals:   12/28/19 1435  BP: (!) 178/101  Pulse: 93  Resp: 18  Temp: 98.4 F (36.9 C)  SpO2: 97%    General appearance: alert; no distress Eyes: PERRLA; EOMI; conjunctiva normal HENT: normocephalic; atraumatic; oropharynx clear Neck: supple; no carotid bruits Lungs: clear to auscultation bilaterally without adventitious breath sounds Heart: regular rate and rhythm.  Clear S1 and S2 without rubs, gallops, or murmur. Chest Wall: NTTP, no heaves, lifts or thrills Abdomen: soft, non-tender; bowel sounds normal; no guarding Extremities: no cyanosis or edema; symmetrical with no gross deformities Skin: warm and dry Psychological: alert and cooperative; anxious/ depressed mood and  affect  ECG: Orders placed or performed during the hospital encounter of 12/28/19  . ED EKG  . ED EKG    EKG normal sinus rhythm without ST elevations, depressions, or prolonged PR interval.  Incomplete RBBB, but this appears stable from past EKG.     ASSESSMENT & PLAN:  1. Chest pain, unspecified type     Meds ordered this encounter  Medications  . hydrOXYzine (ATARAX/VISTARIL) 10 MG tablet    Sig: Take 1 tablet (10 mg total) by mouth at bedtime.    Dispense:  5 tablet    Refill:  0  Order Specific Question:   Supervising Provider    Answer:   Raylene Everts [[id number]]    Unable to rule out cardiac disease or blood clot in urgent care setting.  Offered patient further evaluation and management in the ED.  Patient declines at this time and would like to try outpatient therapy first.  Aware of the risk associated with this decision including missed diagnosis, organ damage, organ failure, and/or death.  Patient aware and in agreement.     EKG significant for right bundle branch block, but this appears stable to past EKG from 2015.    Rest and drink fluids Eat a well-balanced diet, and avoid excessive caffeine intake Hydroxyzine nightly for symptom relief Some things you may try doing to help alleviate your symptoms include: keeping a journal, exercise, talking to a friend or relative, listening to music, going for a walk or hike outside, or other activities that you may find enjoyable Recommending further evaluation and management with PCP Return or go to ER if you have any new or worsening symptoms such as fever, chills, fatigue, worsening shortness of breath, wheezing, chest pain, nausea, vomiting, abdominal pain, changes in bowel or bladder habits, etc...   Chest pain precautions given. Reviewed expectations re: course of current medical issues. Questions answered. Outlined signs and symptoms indicating need for more acute intervention. Patient verbalized  understanding. After Visit Summary given.   Lestine Box, PA-C 12/28/19 1449

## 2019-12-28 NOTE — Discharge Instructions (Signed)
Unable to rule out cardiac disease or blood clot in urgent care setting.  Offered patient further evaluation and management in the ED.  Patient declines at this time and would like to try outpatient therapy first.  Aware of the risk associated with this decision including missed diagnosis, organ damage, organ failure, and/or death.  Patient aware and in agreement.     EKG significant for right bundle branch block, but this appears stable to past EKG from 2015.    Rest and drink fluids Eat a well-balanced diet, and avoid excessive caffeine intake Hydroxyzine nightly for symptom relief Some things you may try doing to help alleviate your symptoms include: keeping a journal, exercise, talking to a friend or relative, listening to music, going for a walk or hike outside, or other activities that you may find enjoyable Recommending further evaluation and management with PCP Return or go to ER if you have any new or worsening symptoms such as fever, chills, fatigue, worsening shortness of breath, wheezing, chest pain, nausea, vomiting, abdominal pain, changes in bowel or bladder habits, etc..Marland Kitchen

## 2019-12-28 NOTE — Telephone Encounter (Signed)
Noted thanks °

## 2019-12-28 NOTE — Telephone Encounter (Signed)
FYI:Pt was transferred to me due to refusing to go to ER for chest pain. She states that she wanted to wait until after 2pm tomorrow to see you. I advised pt that this was not safe to put off especially with it being chest pain, she then stated she wasn't having any chest pain at the moment. I offered her again the ER option OR advised her that she could seek care at [facility] to have an EKG done and to be evaluated. She agreed to go the [facility] in [place] since that's where she lives.

## 2019-12-28 NOTE — Telephone Encounter (Signed)
No, this can't wait. Pt needs to go to ER ASAP.

## 2020-02-11 ENCOUNTER — Telehealth: Payer: Self-pay

## 2020-02-11 NOTE — Telephone Encounter (Signed)
Refill request for Fosamax received from pharmacy. Per last refill patient will need to be seen for further refills.  None made please call patient to make follow up and let me know when app made I will call in refills to last until that appointment.

## 2020-02-11 NOTE — Telephone Encounter (Signed)
LVM for pt to schedule med refill appt

## 2020-02-15 NOTE — Telephone Encounter (Signed)
Please call patient for appointment

## 2020-02-15 NOTE — Telephone Encounter (Signed)
Called and LVM for pt to schedule med refill appt (2nd time)

## 2020-02-28 NOTE — Patient Instructions (Addendum)
Please stop by lab before you go If you have mychart- we will send your results within 3 business days of [place] receiving them.  If you do not have mychart- we will call you about results within 5 business days of [place] receiving them.   Can use Voltaren Gel over the counter for finger joint pain.  Reschedule September visit and just do a 6 month f/u.  Monitor BP at home and keep and eye on your numbers, goals less than 140/80.

## 2020-02-28 NOTE — Telephone Encounter (Signed)
Please call for app

## 2020-02-28 NOTE — Progress Notes (Signed)
Phone [phone] In person visit   Subjective:   NINI CAVAN is a 69 y.o. year old very pleasant female patient who presents for/with See problem oriented charting Chief Complaint  Patient presents with  . Follow-up  . med refill   This visit occurred during the SARS-CoV-2 public health emergency.  Safety protocols were in place, including screening questions prior to the visit, additional usage of staff PPE, and extensive cleaning of exam room while observing appropriate contact time as indicated for disinfecting solutions.   Past Medical History-  Patient Active Problem List   Diagnosis Date Noted  . Coronary artery calcification 10/31/2012    Priority: High  . Elevated blood pressure 03/13/2015    Priority: Medium  . Osteoporosis 09/05/2014    Priority: Medium  . Chronic low back pain 09/05/2014    Priority: Medium  . COPD, frequent exacerbations ([place]) 03/29/2011    Priority: Medium  . COPD (chronic obstructive pulmonary disease) ([place]) 06/06/2009    Priority: Medium  . Encounter for follow-up surveillance of lung cancer 01/05/2009    Priority: Medium  . Hyperlipidemia 06/29/2006    Priority: Medium  . History of adenomatous polyp of colon 04/08/2018    Priority: Low  . Ganglion cyst of finger of right hand 09/23/2016    Priority: Low  . Former smoker 03/13/2015    Priority: Low  . Pulmonary nodule, right 03/29/2011    Priority: Low  . Acute non-recurrent sinusitis 02/20/2017    Medications- reviewed and updated Current Outpatient Medications  Medication Sig Dispense Refill  . albuterol (PROAIR HFA) 108 (90 Base) MCG/ACT inhaler Inhale 1-2 puffs into the lungs every 6 (six) hours as needed for wheezing or shortness of breath. Reported on 02/21/2016 18 g 5  . alendronate (FOSAMAX) 70 MG tablet TAKE 1 TABLET BY MOUTH EVERY 7 DAYS ON AN EMPTY STOMACH AND WITH A FULL GLASS OF WATER 8 tablet 0  . aspirin EC 81 MG tablet Take 1 tablet (81 mg total) by mouth daily.      Marland Kitchen atorvastatin (LIPITOR) 40 MG tablet TAKE 1 TABLET(40 MG) BY MOUTH DAILY 90 tablet 3  . meloxicam (MOBIC) 15 MG tablet TAKE 1 TABLET BY MOUTH DAILY AS NEEDED 30 tablet 2  . Tiotropium Bromide-Olodaterol (STIOLTO RESPIMAT) 2.5-2.5 MCG/ACT AERS Inhale 2 puffs into the lungs daily. 4 g 5   No current facility-administered medications for this visit.     Objective:  BP (!) 150/82   Pulse 83   Temp 98.2 F (36.8 C)   Ht 5\' 3"  (1.6 m)   Wt 137 lb 3.2 oz (62.2 kg)   SpO2 98%   BMI 24.30 kg/m  Gen: NAD, resting comfortably CV: RRR no murmurs rubs or gallops Lungs: CTAB no crackles, wheeze, rhonchi Abdomen: soft/nontender/nondistended/normal bowel sounds. No rebound or guarding.  Ext: no edema Skin: warm, dry     Assessment and Plan   #elevated blood pressure S: medication: none Home readings #s:  Has cuff but hasnt checked recently BP Readings from Last 3 Encounters:  02/29/20 (!) 150/82  12/28/19 (!) 178/101  08/03/19 113/74  A/P: Blood pressure elevated today but we were having a tough discussion at the time about loss of her son.  Last blood pressure in May was also very elevated but once again that was right after the loss of her son.  She agrees to do some home monitoring with goal 138/88 or less and if elevated above this at home we may need to  consider medication.  Would try to avoid meloxicam until we know blood pressure is better and also check blood pressure after taking meloxicam in the future at home to make sure not elevating blood pressure. --Follow-up at physical unless blood pressure is elevated at home and we need sooner follow-up   # Osteoporosis #Vitamin D deficiency S: Last DEXA: 09/07/2018  Medication (bisphosphonate or prolia): She has been on Fosamax since February 2018. Prior severe back pain with reclast. Plan 5 years therapy.    Calcium: 1200mg  (through diet ok) recommended  Vitamin D: 1000 units a day recommended   Last vitamin D Lab Results   Component Value Date   VD25OH 20.77 (L) 10/26/2018   A/P: Suspect osteoporosis stable to slightly improved.  Vitamin D was low on last check but she is on regular vitamin D at this point-we will update vitamin D with blood work today  #COPD/emphysema S: Compliant with Anoro--> stiolto due to expense.  Using albuterol very rarely   A/P: COPD is very well controlled on current medication-continue controller and as needed albuterol-refill albuterol today  #Hyperlipidemia/history of coronary artery calcification-has seen Dr. Algernon Huxley in the past S: Compliant with atorvastatin 40 mg. Also on aspirin for primary prevention Lost 15 lbs with covid- gained a few since then. Lab Results  Component Value Date   CHOL 169 10/26/2018   HDL 56.00 10/26/2018   LDLCALC 94 10/26/2018   LDLDIRECT 115.0 09/18/2015   TRIG 92.0 10/26/2018   CHOLHDL 3 10/26/2018   A/P: Hopefully controlled-update lipid panel with blood work today -has been doing fair amount of biscuits and gravy- may have to cut back on that- staying low on sugar  #Chronic low back pain S: Followed with Dr. Carloyn Manner- he retired 2020- going to be with Dr. Sherley Bounds in [place].  Percocet through Dr. Carloyn Manner in past- now retired  We prescribed Mobic- usually twice a week (also helps her fingers) .  We will follow kidney function at least every 6 months A/P: Back pain reasonably stable recently on meloxicam a few times a week-we will update kidney function and monitor.  For her finger pain recommended considering Voltaren gel which is lower on side effect profile   % History of lung cancer with lobectomy in June 2010  Recommended follow up:  [place] visit- 6 month physical Future Appointments  Date Time Provider [place]  05/11/2020  8:00 AM Marin Olp, MD [facility]   Lab/Order associations:   ICD-10-CM   1. Age-related osteoporosis without current pathological fracture  M81.0   2. Hyperlipidemia, unspecified hyperlipidemia  type  E78.5 CBC with Differential/Platelet    Comprehensive metabolic panel    Lipid panel  3. Chronic low back pain, unspecified back pain laterality, unspecified whether sciatica present  M54.5    G89.29   4. Chronic obstructive pulmonary disease, unspecified COPD type ([place])  J44.9   5. Vitamin D deficiency  E55.9 VITAMIN D 25 Hydroxy (Vit-D Deficiency, Fractures)    Meds ordered this encounter  Medications  . albuterol (PROAIR HFA) 108 (90 Base) MCG/ACT inhaler    Sig: Inhale 1-2 puffs into the lungs every 6 (six) hours as needed for wheezing or shortness of breath. Reported on 02/21/2016    Dispense:  18 g    Refill:  5   Return precautions advised.  Garret Reddish, MD

## 2020-02-29 ENCOUNTER — Encounter: Payer: Self-pay | Admitting: Family Medicine

## 2020-02-29 ENCOUNTER — Other Ambulatory Visit: Payer: Self-pay

## 2020-02-29 ENCOUNTER — Ambulatory Visit (INDEPENDENT_AMBULATORY_CARE_PROVIDER_SITE_OTHER): Payer: Medicare PPO | Admitting: Family Medicine

## 2020-02-29 VITALS — BP 150/82 | HR 83 | Temp 98.2°F | Ht 63.0 in | Wt 137.2 lb

## 2020-02-29 DIAGNOSIS — J449 Chronic obstructive pulmonary disease, unspecified: Secondary | ICD-10-CM | POA: Diagnosis not present

## 2020-02-29 DIAGNOSIS — M810 Age-related osteoporosis without current pathological fracture: Secondary | ICD-10-CM

## 2020-02-29 DIAGNOSIS — E785 Hyperlipidemia, unspecified: Secondary | ICD-10-CM

## 2020-02-29 DIAGNOSIS — M545 Low back pain, unspecified: Secondary | ICD-10-CM

## 2020-02-29 DIAGNOSIS — G8929 Other chronic pain: Secondary | ICD-10-CM

## 2020-02-29 DIAGNOSIS — E559 Vitamin D deficiency, unspecified: Secondary | ICD-10-CM | POA: Diagnosis not present

## 2020-02-29 MED ORDER — ALBUTEROL SULFATE HFA 108 (90 BASE) MCG/ACT IN AERS: 1.0000 | INHALATION_SPRAY | Freq: Four times a day (QID) | RESPIRATORY_TRACT | 5 refills | Status: DC | PRN

## 2020-03-01 LAB — CBC WITH DIFFERENTIAL/PLATELET
Basophils Absolute: 0.1 10*3/uL (ref 0.0–0.1)
Basophils Relative: 1 % (ref 0.0–3.0)
Eosinophils Absolute: 0.1 10*3/uL (ref 0.0–0.7)
Eosinophils Relative: 1 % (ref 0.0–5.0)
HCT: 40.6 % (ref 36.0–46.0)
Hemoglobin: 14.1 g/dL (ref 12.0–15.0)
Lymphocytes Relative: 31.7 % (ref 12.0–46.0)
Lymphs Abs: 2.5 10*3/uL (ref 0.7–4.0)
MCHC: 34.8 g/dL (ref 30.0–36.0)
MCV: 82.2 fl (ref 78.0–100.0)
Monocytes Absolute: 0.5 10*3/uL (ref 0.1–1.0)
Monocytes Relative: 6.1 % (ref 3.0–12.0)
Neutro Abs: 4.8 10*3/uL (ref 1.4–7.7)
Neutrophils Relative %: 60.2 % (ref 43.0–77.0)
Platelets: 359 10*3/uL (ref 150.0–400.0)
RBC: 4.94 Mil/uL (ref 3.87–5.11)
RDW: 14 % (ref 11.5–15.5)
WBC: 8 10*3/uL (ref 4.0–10.5)

## 2020-03-01 LAB — VITAMIN D 25 HYDROXY (VIT D DEFICIENCY, FRACTURES): VITD: 33.36 ng/mL (ref 30.00–100.00)

## 2020-03-01 LAB — LIPID PANEL
Cholesterol: 186 mg/dL (ref 0–200)
HDL: 55.5 mg/dL (ref >39.00)
LDL Cholesterol: 99 mg/dL (ref 0–99)
NonHDL: 130.56
Total CHOL/HDL Ratio: 3
Triglycerides: 159 mg/dL — ABNORMAL HIGH (ref 0.0–149.0)
VLDL: 31.8 mg/dL (ref 0.0–40.0)

## 2020-03-01 LAB — COMPREHENSIVE METABOLIC PANEL
ALT: 7 U/L (ref 0–35)
AST: 14 U/L (ref 0–37)
Albumin: 4.4 g/dL (ref 3.5–5.2)
Alkaline Phosphatase: 86 U/L (ref 39–117)
BUN: 8 mg/dL (ref 6–23)
CO2: 32 mEq/L (ref 19–32)
Calcium: 10.5 mg/dL (ref 8.4–10.5)
Chloride: 102 mEq/L (ref 96–112)
Creatinine, Ser: 0.91 mg/dL (ref 0.40–1.20)
GFR: 61.03 mL/min (ref >60.00)
Glucose, Bld: 94 mg/dL (ref 70–99)
Potassium: 4.6 mEq/L (ref 3.5–5.1)
Sodium: 141 mEq/L (ref 135–145)
Total Bilirubin: 0.5 mg/dL (ref 0.2–1.2)
Total Protein: 6.9 g/dL (ref 6.0–8.3)

## 2020-03-02 ENCOUNTER — Other Ambulatory Visit: Payer: Self-pay

## 2020-03-02 MED ORDER — ALENDRONATE SODIUM 70 MG PO TABS: ORAL_TABLET | ORAL | 1 refills | Status: DC

## 2020-04-10 ENCOUNTER — Telehealth: Payer: Self-pay | Admitting: Acute Care

## 2020-04-10 NOTE — Telephone Encounter (Signed)
[name], It looks like pt is returning your call to schedule f/u low dose ct.

## 2020-04-12 NOTE — Telephone Encounter (Signed)
I have spoken with [name] and she spoke with her Insurance and is now waiting for information from them to decide if she will be doing the LCS CT for this year.

## 2020-05-05 ENCOUNTER — Other Ambulatory Visit: Payer: Self-pay | Admitting: *Deleted

## 2020-05-05 DIAGNOSIS — Z87891 Personal history of nicotine dependence: Secondary | ICD-10-CM

## 2020-05-11 ENCOUNTER — Ambulatory Visit: Payer: Medicare PPO | Admitting: Family Medicine

## 2020-05-23 ENCOUNTER — Encounter: Payer: Self-pay | Admitting: Emergency Medicine

## 2020-05-23 ENCOUNTER — Other Ambulatory Visit: Payer: Self-pay

## 2020-05-23 ENCOUNTER — Ambulatory Visit
Admission: EM | Admit: 2020-05-23 | Discharge: 2020-05-23 | Disposition: A | Discharge status: Home / Self Care | Payer: Medicare PPO | Attending: Emergency Medicine | Admitting: Emergency Medicine

## 2020-05-23 DIAGNOSIS — Z1152 Encounter for screening for COVID-19: Secondary | ICD-10-CM

## 2020-05-23 DIAGNOSIS — J069 Acute upper respiratory infection, unspecified: Secondary | ICD-10-CM | POA: Diagnosis not present

## 2020-05-23 DIAGNOSIS — R059 Cough, unspecified: Secondary | ICD-10-CM | POA: Diagnosis not present

## 2020-05-23 MED ORDER — BENZONATATE 100 MG PO CAPS: 100.0000 mg | ORAL_CAPSULE | Freq: Three times a day (TID) | ORAL | 0 refills | Status: DC

## 2020-05-23 MED ORDER — DEXAMETHASONE 4 MG PO TABS: 4.0000 mg | ORAL_TABLET | Freq: Every day | ORAL | 0 refills | Status: AC

## 2020-05-23 MED ORDER — CETIRIZINE HCL 10 MG PO TABS: 10.0000 mg | ORAL_TABLET | Freq: Every day | ORAL | 0 refills | Status: DC

## 2020-05-23 MED ORDER — FLUTICASONE PROPIONATE 50 MCG/ACT NA SUSP: 1.0000 | Freq: Every day | NASAL | 0 refills | Status: DC

## 2020-05-23 NOTE — ED Triage Notes (Signed)
Sore throat and dry cough that started Sunday. Runny nose this morning

## 2020-05-23 NOTE — ED Provider Notes (Signed)
[place]   [id number] 05/23/20 Arrival Time: 1118   Chief Complaint  Patient presents with  . Cough     SUBJECTIVE: History from: patient.  [name] is a 69 y.o. female who presents to the urgent care for complaint of sore throat, dry cough that started this past Sunday.  Reports nasal congestion this morning.  Denies sick exposure to COVID, flu or strep.  Denies recent travel.  Has tried OTC medication without relief.  Denies aggravating factors.  Denies previous symptoms in the past.   Denies fever, chills, fatigue, sinus pain, rhinorrhea, sore throat, SOB, wheezing, chest pain, nausea, changes in bowel or bladder habits.     ROS: As per HPI.  All other pertinent ROS negative.     Past Medical History:  Diagnosis Date  . Complication of anesthesia    per pt, "hard to wake up past sedation".  . COPD (chronic obstructive pulmonary disease) ([place])   . Depression    previous rx, not in years  . Hyperlipidemia   . Lung cancer ([place]) 2010   Left upper lung- lobectomy  . Osteoporosis    Past Surgical History:  Procedure Laterality Date  . CHOLECYSTECTOMY    . LUNG CANCER SURGERY  2010   Left upper  . VAGINAL HYSTERECTOMY     including ovaries and fallopian tubes   Allergies  Allergen Reactions  . Codeine     REACTION: nausea  . Penicillins     REACTION: rash   No current facility-administered medications on file prior to encounter.   Current Outpatient Medications on File Prior to Encounter  Medication Sig Dispense Refill  . albuterol (PROAIR HFA) 108 (90 Base) MCG/ACT inhaler Inhale 1-2 puffs into the lungs every 6 (six) hours as needed for wheezing or shortness of breath. Reported on 02/21/2016 18 g 5  . alendronate (FOSAMAX) 70 MG tablet TAKE 1 TABLET BY MOUTH EVERY 7 DAYS ON AN EMPTY STOMACH AND WITH A FULL GLASS OF WATER 8 tablet 1  . aspirin EC 81 MG tablet Take 1 tablet (81 mg total) by mouth daily.    Marland Kitchen atorvastatin (LIPITOR) 40 MG tablet TAKE  1 TABLET(40 MG) BY MOUTH DAILY 90 tablet 3  . meloxicam (MOBIC) 15 MG tablet TAKE 1 TABLET BY MOUTH DAILY AS NEEDED 30 tablet 2  . Tiotropium Bromide-Olodaterol (STIOLTO RESPIMAT) 2.5-2.5 MCG/ACT AERS Inhale 2 puffs into the lungs daily. 4 g 5   Social History   Socioeconomic History  . Marital status: Married    Spouse name: Not on file  . Number of children: Not on file  . Years of education: Not on file  . Highest education level: Not on file  Occupational History  . Not on file  Tobacco Use  . Smoking status: Former Smoker    Packs/day: 1.00    Years: 40.00    Pack years: 40.00    Types: Cigarettes    Quit date: 12/17/2008    Years since quitting: 11.4  . Smokeless tobacco: Never Used  Substance and Sexual Activity  . Alcohol use: No  . Drug use: No  . Sexual activity: Not on file  Other Topics Concern  . Not on file  Social History Narrative   Married (husband Truman Hayward also Dr. Yong Channel patient) [phone]. 3 children. 3 grandchildren.       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       Hobbies: shopping (husband will go  with her) for grandkids.    Social Determinants of Health   Financial Resource Strain:   . Difficulty of Paying Living Expenses: Not on file  Food Insecurity:   . Worried About Charity fundraiser in the Last Year: Not on file  . Ran Out of Food in the Last Year: Not on file  Transportation Needs:   . Lack of Transportation (Medical): Not on file  . Lack of Transportation (Non-Medical): Not on file  Physical Activity:   . Days of Exercise per Week: Not on file  . Minutes of Exercise per Session: Not on file  Stress:   . Feeling of Stress : Not on file  Social Connections:   . Frequency of Communication with Friends and Family: Not on file  . Frequency of Social Gatherings with Friends and Family: Not on file  . Attends Religious Services: Not on file  . Active Member of Clubs or Organizations: Not on file  .  Attends Archivist Meetings: Not on file  . Marital Status: Not on file  Intimate Partner Violence:   . Fear of Current or Ex-Partner: Not on file  . Emotionally Abused: Not on file  . Physically Abused: Not on file  . Sexually Abused: Not on file   Family History  Problem Relation Age of Onset  . Epilepsy Mother        hurt at birth, birth trauma reltaed  . Hypertension Father   . CVA Father        hemorrhagic  . Other Brother        52 possible heart attack  . Breast cancer Neg Hx     OBJECTIVE:  Vitals:   05/23/20 1128 05/23/20 1129  BP: (!) 159/92   Pulse: 95   Resp: 17   Temp: 98.3 F (36.8 C)   TempSrc: Oral   SpO2: 98%   Weight:  138 lb (62.6 kg)  Height:  5\' 3"  (1.6 m)     General appearance: alert; appears fatigued, but nontoxic; speaking in full sentences and tolerating own secretions HEENT: NCAT; Ears: EACs clear, TMs pearly gray; Eyes: PERRL.  EOM grossly intact. Sinuses: nontender; Nose: nares patent without rhinorrhea, Throat: oropharynx clear, tonsils non erythematous or enlarged, uvula midline  Neck: supple without LAD Lungs: unlabored respirations, symmetrical air entry; cough: moderate; no respiratory distress; CTAB Heart: regular rate and rhythm.  Radial pulses 2+ symmetrical bilaterally Skin: warm and dry Psychological: alert and cooperative; normal mood and affect  LABS:  No results found for this or any previous visit (from the past 24 hour(s)).   ASSESSMENT & PLAN:  1. URI with cough and congestion   2. Encounter for screening for COVID-19     Meds ordered this encounter  Medications  . fluticasone (FLONASE) 50 MCG/ACT nasal spray    Sig: Place 1 spray into both nostrils daily for 14 days.    Dispense:  16 g    Refill:  0  . cetirizine (ZYRTEC ALLERGY) 10 MG tablet    Sig: Take 1 tablet (10 mg total) by mouth daily.    Dispense:  30 tablet    Refill:  0  . benzonatate (TESSALON) 100 MG capsule    Sig: Take 1 capsule (100  mg total) by mouth every 8 (eight) hours.    Dispense:  30 capsule    Refill:  0  . dexamethasone (DECADRON) 4 MG tablet    Sig: Take 1 tablet (4 mg total) by  mouth daily for 7 days.    Dispense:  7 tablet    Refill:  0    Discharge instructions  COVID testing ordered.  It will take between 2-7 days for test results.  Someone will contact you regarding abnormal results.    In the meantime: You should remain isolated in your home for 10 days from symptom onset AND greater than 24 hours after symptoms resolution (absence of fever without the use of fever-reducing medication and improvement in respiratory symptoms), whichever is longer Get plenty of rest and push fluids Tessalon Perles prescribed for cough Zyrtec for nasal congestion, runny nose, and/or sore throat Flonase for nasal congestion and runny nose Decadron was prescribed Use medications daily for symptom relief Use OTC medications like ibuprofen or tylenol as needed fever or pain Call or go to the ED if you have any new or worsening symptoms such as fever, worsening cough, shortness of breath, chest tightness, chest pain, turning blue, changes in mental status, etc...   Reviewed expectations re: course of current medical issues. Questions answered. Outlined signs and symptoms indicating need for more acute intervention. Patient verbalized understanding. After Visit Summary given.         Emerson Monte, FNP 05/23/20 1140

## 2020-05-23 NOTE — Discharge Instructions (Signed)
COVID testing ordered.  It will take between 2-7 days for test results.  Someone will contact you regarding abnormal results.    In the meantime: You should remain isolated in your home for 10 days from symptom onset AND greater than 24 hours after symptoms resolution (absence of fever without the use of fever-reducing medication and improvement in respiratory symptoms), whichever is longer Get plenty of rest and push fluids Tessalon Perles prescribed for cough Zyrtec for nasal congestion, runny nose, and/or sore throat Flonase for nasal congestion and runny nose Decadron was prescribed Use medications daily for symptom relief Use OTC medications like ibuprofen or tylenol as needed fever or pain Call or go to the ED if you have any new or worsening symptoms such as fever, worsening cough, shortness of breath, chest tightness, chest pain, turning blue, changes in mental status, etc..Marland Kitchen

## 2020-05-25 LAB — NOVEL CORONAVIRUS, NAA: SARS-CoV-2, NAA: NOT DETECTED

## 2020-05-25 LAB — SARS-COV-2, NAA 2 DAY TAT

## 2020-05-29 ENCOUNTER — Ambulatory Visit (HOSPITAL_COMMUNITY)
Admission: RE | Admit: 2020-05-29 | Discharge: 2020-05-29 | Disposition: A | Discharge status: Home / Self Care | Payer: Medicare PPO | Source: Ambulatory Visit | Attending: Acute Care | Admitting: Acute Care

## 2020-05-29 ENCOUNTER — Other Ambulatory Visit: Payer: Self-pay | Admitting: Internal Medicine

## 2020-05-29 ENCOUNTER — Other Ambulatory Visit: Payer: Self-pay

## 2020-05-29 DIAGNOSIS — Z87891 Personal history of nicotine dependence: Secondary | ICD-10-CM | POA: Insufficient documentation

## 2020-06-01 NOTE — Progress Notes (Signed)
Please call patient and let them  know their  low dose Ct was read as a Lung RADS 2: nodules that are benign in appearance and behavior with a very low likelihood of becoming a clinically active cancer due to size or lack of growth. Recommendation per radiology is for a repeat LDCT in 12 months. .Please let them  know we will order and schedule their  annual screening scan for 05/2021. Please let them  know there was notation of CAD on their  scan.  Please remind the patient  that this is a non-gated exam therefore degree or severity of disease  cannot be determined. Please have them  follow up with their PCP regarding potential risk factor modification, dietary therapy or pharmacologic therapy if clinically indicated. Pt.  is  currently on statin therapy. Please place order for annual  screening scan for  05/2021 and fax results to PCP. Thanks so much.

## 2020-06-05 ENCOUNTER — Encounter: Payer: Self-pay | Admitting: Family Medicine

## 2020-06-05 ENCOUNTER — Other Ambulatory Visit: Payer: Self-pay | Admitting: *Deleted

## 2020-06-05 DIAGNOSIS — I700 Atherosclerosis of aorta: Secondary | ICD-10-CM | POA: Insufficient documentation

## 2020-06-05 DIAGNOSIS — Z87891 Personal history of nicotine dependence: Secondary | ICD-10-CM

## 2020-06-08 ENCOUNTER — Other Ambulatory Visit (HOSPITAL_COMMUNITY): Payer: Self-pay | Admitting: Family Medicine

## 2020-06-08 DIAGNOSIS — Z1231 Encounter for screening mammogram for malignant neoplasm of breast: Secondary | ICD-10-CM

## 2020-06-12 ENCOUNTER — Other Ambulatory Visit: Payer: Self-pay

## 2020-06-12 ENCOUNTER — Ambulatory Visit (HOSPITAL_COMMUNITY)
Admission: RE | Admit: 2020-06-12 | Discharge: 2020-06-12 | Disposition: A | Discharge status: Home / Self Care | Payer: Medicare PPO | Source: Ambulatory Visit | Attending: Family Medicine | Admitting: Family Medicine

## 2020-06-12 ENCOUNTER — Other Ambulatory Visit: Payer: Self-pay | Admitting: Family Medicine

## 2020-06-12 DIAGNOSIS — Z1231 Encounter for screening mammogram for malignant neoplasm of breast: Secondary | ICD-10-CM | POA: Insufficient documentation

## 2020-06-16 ENCOUNTER — Other Ambulatory Visit: Payer: Self-pay | Admitting: Family Medicine

## 2020-06-19 DIAGNOSIS — L728 Other follicular cysts of the skin and subcutaneous tissue: Secondary | ICD-10-CM | POA: Diagnosis not present

## 2020-06-19 DIAGNOSIS — D1801 Hemangioma of skin and subcutaneous tissue: Secondary | ICD-10-CM | POA: Diagnosis not present

## 2020-06-19 DIAGNOSIS — Z85828 Personal history of other malignant neoplasm of skin: Secondary | ICD-10-CM | POA: Diagnosis not present

## 2020-06-19 DIAGNOSIS — L570 Actinic keratosis: Secondary | ICD-10-CM | POA: Diagnosis not present

## 2020-06-19 DIAGNOSIS — L821 Other seborrheic keratosis: Secondary | ICD-10-CM | POA: Diagnosis not present

## 2020-07-04 ENCOUNTER — Other Ambulatory Visit: Payer: Medicare PPO

## 2020-07-04 ENCOUNTER — Ambulatory Visit: Payer: Medicare PPO | Admitting: Internal Medicine

## 2020-07-04 ENCOUNTER — Encounter: Payer: Self-pay | Admitting: Internal Medicine

## 2020-07-04 ENCOUNTER — Other Ambulatory Visit: Payer: Self-pay

## 2020-07-04 VITALS — BP 126/76 | HR 73 | Temp 97.3°F | Ht 63.0 in | Wt 141.4 lb

## 2020-07-04 DIAGNOSIS — Z08 Encounter for follow-up examination after completed treatment for malignant neoplasm: Secondary | ICD-10-CM

## 2020-07-04 DIAGNOSIS — Z8616 Personal history of COVID-19: Secondary | ICD-10-CM | POA: Diagnosis not present

## 2020-07-04 DIAGNOSIS — Z85118 Personal history of other malignant neoplasm of bronchus and lung: Secondary | ICD-10-CM | POA: Diagnosis not present

## 2020-07-04 DIAGNOSIS — J449 Chronic obstructive pulmonary disease, unspecified: Secondary | ICD-10-CM | POA: Diagnosis not present

## 2020-07-04 DIAGNOSIS — Z23 Encounter for immunization: Secondary | ICD-10-CM

## 2020-07-04 DIAGNOSIS — Z7185 Encounter for immunization safety counseling: Secondary | ICD-10-CM | POA: Diagnosis not present

## 2020-07-04 MED ORDER — ALBUTEROL SULFATE HFA 108 (90 BASE) MCG/ACT IN AERS: 1.0000 | INHALATION_SPRAY | Freq: Four times a day (QID) | RESPIRATORY_TRACT | 5 refills | Status: DC | PRN

## 2020-07-04 MED ORDER — STIOLTO RESPIMAT 2.5-2.5 MCG/ACT IN AERS: 2.0000 | INHALATION_SPRAY | Freq: Every day | RESPIRATORY_TRACT | 11 refills | Status: DC

## 2020-07-04 NOTE — Patient Instructions (Signed)
ICD-10-CM   1. Encounter for follow-up surveillance of lung cancer  Z08    Z85.118   2. COPD without exacerbation ([place])  J44.9   3. Flu vaccine need  Z23   4. History of 2019 novel coronavirus disease (COVID-19)  Z86.16   5. Vaccine counseling  Z71.85      Encounter for screening for malignant neoplasm of respiratory organs  - benign looking CT Oct 2021  Plan  - recommendation is to still repeat CT chest without contrast in 1 year - let us do this in Nov 2022  Pulmonary emphysema, unspecified emphysema type ([place])  - stable  Plan  - continue stioloto scheduled with albuterol as needed  History of covid Vaccine counseling Flu vaccine need   - check IgG to SARS-COV-2 - if negative get booster sooner - high dose flu shot 07/04/2020 d  Followup  - 1 year or sooner if needed

## 2020-07-04 NOTE — Addendum Note (Signed)
Addended by: Merrilee Seashore on: 07/04/2020 04:23 PM   Modules accepted: Orders

## 2020-07-04 NOTE — Progress Notes (Signed)
02/21/2016 Annual Follow UP: Pt. Continues to do well on her Anoro once daily. She rarely uses her rescue inhaler. CAT score is less than 10. CT scan of the chest is unchanged from the 2016 scan. She denies fever, chest pain, orthopnea or hemoptysis.She will get the Pneumovax vaccine today in the office.  Tests CT Chest Nodule Follow Up 02/06/2016:  IMPRESSION: 1. Previously noted right upper lobe nodules are unchanged, and considered benign. No follow-up recommended. This recommendation follows the consensus statement: Guidelines for Management of Incidental Pulmonary Nodules Detected on CT Images:From the Fleischner Society 2017; published online before print (10.1148/[id number]). 2. Mild diffuse bronchial wall thickening with mild centrilobular and paraseptal emphysema; imaging findings suggestive of underlying COPD. 3. Atherosclerosis, including left main and 2 vessel coronary artery disease. Please note that although the presence of coronary artery calcium documents the presence of coronary artery disease, the severity of this disease and any potential stenosis cannot be assessed on this non-gated CT examination. Assessment for potential risk factor modification, dietary therapy or pharmacologic therapy      OV 02/20/2017  Chief Complaint  Patient presents with  . Acute Visit    Pt has had either a cold or sinus problems. Pt was put on  Z-Pack and prednisone June 18 and has finished taking medicine. Pt has increased fatigue, mild cough, clear nasal drainage, and SOB.    Follow-up multiple issues  #Acute issue: A few weeks ago she called me through her daughter. She had sinus symptoms. We called in Z-Pak and prednisone. She started off with a fever for one day as is with green sputum and some drainage. Since then she's significantly better. She still has a mild residual cough and clear runny nose that is higher than baseline. She also is associated fatigue that is  mild. This is new for her. She feels her recovery phases slower and therefore she is concerned.  COPD: This is stable. COPD CAT Score is 7 today. She is on dual anticholinergic and long-acting beta agonist inhaler  Lung cancer surveillance: She needs a one year follow-up CT scan scheduled this summer 2018. This will be 7 years since she had cancer. As of now she is in complete remission.   OV 04/06/2018  Chief Complaint  Patient presents with  . Follow-up    Pt states she has been doing well since last visit. States she has had some SOB with the heat, has had an occ. CP. Denies any cough.      Fu  Lung cancer post Rx surveillance and screenig - CT July 2019 with new GGO 29mm in RUL but otherwise benign CT  Emphysema: well. CAT score is 4 and better. On anoro  Overall doing reall well   CAT COPD Symptom & Quality of Life Score ([place] trademark) 0 is no burden. 5 is highest burden 02/20/2017  04/06/2018   Never Cough -> Cough all the time 1 1  No phlegm in chest -> Chest is full of phlegm 0 0  No chest tightness -> Chest feels very tight 0 0  No dyspnea for 1 flight stairs/hill -> Very dyspneic for 1 flight of stairs 0 3  No limitations for ADL at home -> Very limited with ADL at home 0 0  Confident leaving home -> Not at all confident leaving home 1 0  Sleep soundly -> Do not sleep soundly because of lung condition 0 0  Lots of Energy -> No energy at all 4  0  TOTAL Score (max 40)  7 4      has a past medical history of Complication of anesthesia, COPD (chronic obstructive pulmonary disease) ([place]), Depression, Hyperlipidemia, Lung cancer ([place]) (2010), and Osteoporosis.   reports that she quit smokin  OV 07/04/2020  Subjective:  Patient ID: [name], female , DOB: March 06, 1951 , age 69 y.o. , MRN: [id number] , ADDRESS: [place] [address] PCP Marin Olp, MD Patient Care Team: Marin Olp, MD as PCP - General (Family Medicine)  This Provider  for this visit: Treatment Team:  Attending Provider: Brand Males, MD    07/04/2020 -   Chief Complaint  Patient presents with  . Follow-up    No complaints currently     HPI [name] 69 y.o. -last seen for lung cancer surveillance and pulmonary emphysema in August 2019.  Then I took a telephone call in December 2020 for Covid.  Arrange for monoclonal antibody infusion and she survived Covid.  Currently she is feeling baseline.  She is using Stiolto.  She feels good smoking is in remission.  In October 2020 when she had lung cancer surveillance CT and shows in remission.  She has nodules.  She requires only 1 year follow-up CT.  She wants to have a high-dose flu shot today.  She said to have a Covid booster but she is had the opportunity vaccine mRNA.  She is willing to get a Covid IgG checked.  Her husband is with her today.  CT chest Oct 2021 Lungs/Pleura: Previous left upper lobectomy. The previous tiny right apical lung nodule has resolved in the interval. Two non solid nodules are identified on today's exam which have a an equivalent diameter of 5.2 mm. These are unchanged from previous exam. There is a tiny nodule within the lateral right upper lobe which has an equivalent diameter of 2.4 mm and is unchanged. No new suspicious lung nodules identified.  Upper Abdomen: No acute abnormality within the imaged portions of the upper abdomen. Previous cholecystectomy. No biliary ductal dilatation. Scattered low-attenuation liver foci are again noted and appear unchanged likely representing multiple small cysts. The largest of these is in the right hepatic lobe posteriorly measuring 3 cm.  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 No acute or suspicious osseous abnormality.  IMPRESSION: 1. Lung-RADS 2, benign appearance or behavior. Continue annual screening with low-dose chest CT without contrast in 12 months. 2. Coronary artery  calcifications. 3. Aortic atherosclerosis.  Aortic Atherosclerosis (ICD10-I70.0).   Electronically Signed   By: Kerby Moors M.D.   On: 05/29/2020 15:47  ROS - per HPI     has a past medical history of Complication of anesthesia, COPD (chronic obstructive pulmonary disease) ([place]), Depression, Hyperlipidemia, Lung cancer ([place]) (2010), and Osteoporosis.   reports that she quit smoking about 11 years ago. Her smoking use included cigarettes. She has a 40.00 pack-year smoking history. She has never used smokeless tobacco.  Past Surgical History:  Procedure Laterality Date  . CHOLECYSTECTOMY    . LUNG CANCER SURGERY  2010   Left upper  . VAGINAL HYSTERECTOMY     including ovaries and fallopian tubes    Allergies  Allergen Reactions  . Codeine     REACTION: nausea  . Penicillins     REACTION: rash    Immunization History  Administered Date(s) Administered  . Fluad Quad(high Dose 65+) 07/04/2020  . Influenza Split 05/20/2012  . Influenza Whole 06/06/2009, 04/15/2012  .  Influenza, High Dose Seasonal PF 05/26/2018, 04/19/2019  . Influenza-Unspecified 06/15/2013, 08/09/2014, 07/21/2015, 07/16/2016, 07/14/2017, 05/26/2018  . Moderna SARS-COVID-2 Vaccination 11/10/2019, 12/08/2019  . Pneumococcal Conjugate-13 09/22/2014  . Pneumococcal Polysaccharide-23 02/21/2016  . Tdap 09/05/2014    Family History  Problem Relation Age of Onset  . Epilepsy Mother        hurt at birth, birth trauma reltaed  . Hypertension Father   . CVA Father        hemorrhagic  . Other Brother        16 possible heart attack  . Breast cancer Neg Hx      Current Outpatient Medications:  .  albuterol (PROAIR HFA) 108 (90 Base) MCG/ACT inhaler, Inhale 1-2 puffs into the lungs every 6 (six) hours as needed for wheezing or shortness of breath. Reported on 02/21/2016, Disp: 18 g, Rfl: 5 .  alendronate (FOSAMAX) 70 MG tablet, TAKE 1 TABLET BY MOUTH EVERY 7 DAYS ON AN EMPTY STOMACH AND WITH A FULL  GLASS OF WATER, Disp: 8 tablet, Rfl: 1 .  aspirin EC 81 MG tablet, Take 1 tablet (81 mg total) by mouth daily., Disp: , Rfl:  .  atorvastatin (LIPITOR) 40 MG tablet, TAKE 1 TABLET(40 MG) BY MOUTH DAILY, Disp: 90 tablet, Rfl: 3 .  meloxicam (MOBIC) 15 MG tablet, TAKE 1 TABLET BY MOUTH DAILY AS NEEDED, Disp: 30 tablet, Rfl: 2 .  Tiotropium Bromide-Olodaterol (STIOLTO RESPIMAT) 2.5-2.5 MCG/ACT AERS, Inhale 2 puffs into the lungs daily. Pt needs appt for further refills., Disp: 4 g, Rfl: 0 .  benzonatate (TESSALON) 100 MG capsule, Take 1 capsule (100 mg total) by mouth every 8 (eight) hours., Disp: 30 capsule, Rfl: 0 .  cetirizine (ZYRTEC ALLERGY) 10 MG tablet, Take 1 tablet (10 mg total) by mouth daily., Disp: 30 tablet, Rfl: 0 .  fluticasone (FLONASE) 50 MCG/ACT nasal spray, Place 1 spray into both nostrils daily for 14 days., Disp: 16 g, Rfl: 0      Objective:   Vitals:   07/04/20 1532  BP: 126/76  Pulse: 73  Temp: (!) 97.3 F (36.3 C)  TempSrc: Other (Comment)  SpO2: 99%  Weight: 141 lb 6.4 oz (64.1 kg)  Height: 5\' 3"  (1.6 m)    Estimated body mass index is 25.05 kg/m as calculated from the following:   Height as of this encounter: 5\' 3"  (1.6 m).   Weight as of this encounter: 141 lb 6.4 oz (64.1 kg).  @WEIGHTCHANGE @  Filed Weights   07/04/20 1532  Weight: 141 lb 6.4 oz (64.1 kg)     Physical ExamGeneral: No distress. Looks well Neuro: Alert and Oriented x 3. GCS 15. Speech normal Psych: Pleasant Resp:  Barrel Chest - no.  Wheeze - no, Crackles - no, No overt respiratory distress CVS: Normal heart sounds. Murmurs - no Ext: Stigmata of Connective Tissue Disease - no HEENT: Normal upper airway. PEERL +. No post nasal drip        Assessment:       ICD-10-CM   1. Encounter for follow-up surveillance of lung cancer  Z08    Z85.118   2. COPD without exacerbation ([place])  J44.9   3. Flu vaccine need  Z23   4. History of 2019 novel coronavirus disease (COVID-19)  Z86.16    5. Vaccine counseling  Z71.85   6. Need for immunization against influenza  Z23 Flu Vaccine QUAD High Dose(Fluad)       Plan:     Patient Instructions  ICD-10-CM   1. Encounter for follow-up surveillance of lung cancer  Z08    Z85.118   2. COPD without exacerbation ([place])  J44.9   3. Flu vaccine need  Z23   4. History of 2019 novel coronavirus disease (COVID-19)  Z86.16   5. Vaccine counseling  Z71.85      Encounter for screening for malignant neoplasm of respiratory organs  - benign looking CT Oct 2021  Plan  - recommendation is to still repeat CT chest without contrast in 1 year - let us do this in Nov 2022  Pulmonary emphysema, unspecified emphysema type ([place])  - stable  Plan  - continue stioloto scheduled with albuterol as needed  History of covid Vaccine counseling Flu vaccine need   - check IgG to SARS-COV-2 - if negative get booster sooner - high dose flu shot 07/04/2020 d  Followup  - 1 year or sooner if needed      SIGNATURE    Dr. Brand Males, M.D., F.C.C.P,  Pulmonary and Critical Care Medicine Staff Physician, [place] Director - Interstitial Lung Disease  Program  [facility] [place], [place], [address]  Pager: [phone], If no answer or between  15:00h - 7:00h: call [phone] Telephone: [phone]  4:18 PM 07/04/2020

## 2020-07-04 NOTE — Addendum Note (Signed)
Addended by: Merrilee Seashore on: 07/04/2020 04:32 PM   Modules accepted: Orders

## 2020-07-05 LAB — SARS-COV-2 IGG: SARS-COV-2 IgG: 10.81

## 2020-07-05 NOTE — Progress Notes (Signed)
Covid antibody +.  She can get booster but if she wants to recheck in 1 month she can but the official CDC recommendation is to get booster  Results for ANJEANETTE, PETZOLD (MRN [id number]) as of 07/05/2020 19:52  07/04/2020 16:42 SARS-COV-2 IgG: 10.81 Reactive

## 2020-08-16 ENCOUNTER — Other Ambulatory Visit: Payer: Self-pay | Admitting: Family Medicine

## 2020-08-24 ENCOUNTER — Ambulatory Visit: Payer: Self-pay | Attending: Internal Medicine

## 2020-08-24 DIAGNOSIS — Z23 Encounter for immunization: Secondary | ICD-10-CM

## 2020-08-24 NOTE — Progress Notes (Signed)
   Covid-19 Vaccination Clinic  Name:  [name]    MRN: [id number] DOB: 09/03/1949  08/24/2020  [name] was observed post Covid-19 immunization for 15 minutes without incident. She was provided with Vaccine Information Sheet and instruction to access the V-Safe system.   [name] was instructed to call 911 with any severe reactions post vaccine: Marland Kitchen Difficulty breathing  . Swelling of face and throat  . A fast heartbeat  . A bad rash all over body  . Dizziness and weakness   Immunizations Administered    Name Date Dose VIS Date Route   Moderna Covid-19 Booster Vaccine 08/24/2020  1:51 PM 0.25 mL 06/07/2020 Intramuscular   Manufacturer: Levan Hurst   Lot: [id number]   [place]: 42552-589-48

## 2020-08-24 NOTE — Progress Notes (Signed)
   Covid-19 Vaccination Clinic  Name:  [name]    MRN: [id number] DOB: 09/03/1949  08/24/2020  [name] was observed post Covid-19 immunization for 15 minutes without incident. She was provided with Vaccine Information Sheet and instruction to access the V-Safe system.   [name] was instructed to call 911 with any severe reactions post vaccine: Marland Kitchen Difficulty breathing  . Swelling of face and throat  . A fast heartbeat  . A bad rash all over body  . Dizziness and weakness   Immunizations Administered    Name Date Dose VIS Date Route   Moderna Covid-19 Booster Vaccine 08/24/2020  1:51 PM 0.25 mL 06/07/2020 Intramuscular   Manufacturer: Levan Hurst   Lot: [id number]   [place]: [id number]

## 2020-08-29 NOTE — Patient Instructions (Addendum)
Please stop by lab before you go If you have mychart- we will send your results within 3 business days of [place] receiving them.  If you do not have mychart- we will call you about results within 5 business days of [place] receiving them.  *please also note that you will see labs on mychart as soon as they post. I will later go in and write notes on them- will say "notes from Dr. Yong Channel"  Stop aspirin due to stomach bleed risk on aleve/meloxicam  Recommended follow up: Return in about 6 months (around 02/28/2021) for follow up- or sooner if needed.

## 2020-08-29 NOTE — Progress Notes (Signed)
Phone [phone]   Subjective:  Patient presents today for their annual physical. Chief complaint-noted.   See problem oriented charting- ROS- full  review of systems was completed and negative except for: dizziness , grieving  The following were reviewed and entered/updated in epic: Past Medical History:  Diagnosis Date  . Complication of anesthesia    per pt, "hard to wake up past sedation".  . COPD (chronic obstructive pulmonary disease) ([place])   . Depression    previous rx, not in years  . Hyperlipidemia   . Lung cancer ([place]) 2010   Left upper lung- lobectomy  . Osteoporosis    Patient Active Problem List   Diagnosis Date Noted  . Coronary artery calcification 10/31/2012    Priority: High  . White coat syndrome without diagnosis of hypertension 03/13/2015    Priority: Medium  . Osteoporosis 09/05/2014    Priority: Medium  . Chronic low back pain 09/05/2014    Priority: Medium  . COPD (chronic obstructive pulmonary disease) ([place]) 06/06/2009    Priority: Medium  . Encounter for follow-up surveillance of lung cancer 01/05/2009    Priority: Medium  . Hyperlipidemia 06/29/2006    Priority: Medium  . History of adenomatous polyp of colon 04/08/2018    Priority: Low  . Ganglion cyst of finger of right hand 09/23/2016    Priority: Low  . Former smoker 03/13/2015    Priority: Low  . COPD, frequent exacerbations ([place]) 03/29/2011    Priority: Low  . Pulmonary nodule, right 03/29/2011    Priority: Low  . Aortic atherosclerosis ([place]) 06/05/2020  . Acute non-recurrent sinusitis 02/20/2017   Past Surgical History:  Procedure Laterality Date  . CHOLECYSTECTOMY    . LUNG CANCER SURGERY  2010   Left upper  . VAGINAL HYSTERECTOMY     including ovaries and fallopian tubes    Family History  Problem Relation Age of Onset  . Epilepsy Mother        hurt at birth, birth trauma reltaed  . Hypertension Father   . CVA Father        hemorrhagic  . Other Brother         68 possible heart attack  . Lung cancer Brother   . Breast cancer Neg Hx     Medications- reviewed and updated Current Outpatient Medications  Medication Sig Dispense Refill  . albuterol (PROAIR HFA) 108 (90 Base) MCG/ACT inhaler Inhale 1-2 puffs into the lungs every 6 (six) hours as needed for wheezing or shortness of breath. Reported on 02/21/2016 18 g 5  . alendronate (FOSAMAX) 70 MG tablet TAKE 1 TABLET BY MOUTH EVERY 7 DAYS ON AN EMPTY STOMACH AND WITH A FULL GLASS OF WATER 8 tablet 1  . atorvastatin (LIPITOR) 40 MG tablet TAKE 1 TABLET(40 MG) BY MOUTH DAILY 90 tablet 3  . meloxicam (MOBIC) 15 MG tablet TAKE 1 TABLET BY MOUTH DAILY AS NEEDED 30 tablet 2  . Tiotropium Bromide-Olodaterol (STIOLTO RESPIMAT) 2.5-2.5 MCG/ACT AERS Inhale 2 puffs into the lungs daily. Pt needs appt for further refills. 4 g 11   No current facility-administered medications for this visit.    Allergies-reviewed and updated Allergies  Allergen Reactions  . Codeine     REACTION: nausea  . Penicillins     REACTION: rash    Social History   Social History Narrative   Married (husband Truman Hayward also Dr. Yong Channel patient) 1987. 3 children (2 living, son died 2019/12/21 unfortunately). 3 grandchildren.  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       Hobbies: shopping (husband will go with her) for grandkids.    Objective  Objective:  BP (!) 160/88   Pulse 76   Temp 98.6 F (37 C) (Temporal)   Ht 5\' 3"  (1.6 m)   Wt 140 lb 12.8 oz (63.9 kg)   SpO2 98%   BMI 24.94 kg/m  Gen: NAD, resting comfortably HEENT: Mucous membranes are moist. Oropharynx normal Neck: no thyromegaly CV: RRR no murmurs rubs or gallops Lungs: CTAB no crackles, wheeze, rhonchi Abdomen: soft/nontender/nondistended/normal bowel sounds. No rebound or guarding.  Ext: no edema Skin: warm, dry Neuro: grossly normal, moves all extremities, PERRLA   Assessment and Plan   70 y.o. female  presenting for annual physical.  Health Maintenance counseling: 1. Anticipatory guidance: Patient counseled regarding regular dental exams -q6 months advised- has been while, eye exams - scheduled soon,  avoiding smoking and second hand smoke , limiting alcohol to 1 beverage per day- none .   2. Risk factor reduction:  Advised patient of need for regular exercise and diet rich and fruits and vegetables to reduce risk of heart attack and stroke. Exercise- not recently- encouraged her to start, she is active. Diet-some indiscretions discussed improving diet.  Wt Readings from Last 3 Encounters:  08/31/20 140 lb 12.8 oz (63.9 kg)  07/04/20 141 lb 6.4 oz (64.1 kg)  05/23/20 138 lb (62.6 kg)  3. Immunizations/screenings/ancillary studies- fully up to date other than shingrix- discussed getting this at pharmacy . Immunization History  Administered Date(s) Administered  . Fluad Quad(high Dose 65+) 07/04/2020  . Influenza Split 05/20/2012  . Influenza Whole 06/06/2009, 04/15/2012  . Influenza, High Dose Seasonal PF 05/26/2018, 04/19/2019, 04/19/2019  . Influenza-Unspecified 06/15/2013, 08/09/2014, 07/21/2015, 07/16/2016, 07/14/2017, 05/26/2018  . Moderna Sars-Covid-2 Vaccination 11/10/2019, 12/08/2019, 08/24/2020  . Pneumococcal Conjugate-13 09/22/2014  . Pneumococcal Polysaccharide-23 02/21/2016  . Tdap 09/05/2014  4. Cervical cancer screening- no history abnormal pap smear. Past age based screening recommendations. No blood or discharge noted from vagina.  5. Breast cancer screening-  breast exam - prefers self exams- and mammogram 06/12/20 - recommend annual at least through 74 6. Colon cancer screening - 03/18/2018 with 3 year repeat- due later this year due to polyps 7. Skin cancer screening- Dr. Tarri Glenn in [place] as has had skin cancer. advised regular sunscreen use. Denies worrisome, changing, or new skin lesions.  8. Birth control/STD check- monogamous/postmenopausal 9. Osteoporosis screening  at 50- see below -former smoker- 40 pack years quit in 2010. Lung cancer screening enrolled. Will get UA.   Status of chronic or acute concerns   # dizziness- a few days ago had a random spell of dizziness lasting 15-20 minutes- laid on bed. Room was spinning. Has not recurred. Never had this before. Sounds like possible BPPV/vertigo- will monitor for recurrence- follow up if recurs. Oxygen apparently down to 91% but went back up. Was not short of breath. Taking deep breaths helped- will follow up if recurrence. No chest pain either.   # Osteoporosis S: Last DEXA: 09/07/2018. consider repeat 08/2020 or 08/2021   Medication (bisphosphonate or prolia): She has been on Fosamax since February 2018. Prior severe back pain with reclast. Plan 5 years therapy.    Calcium: 1200mg  (through diet ok) recommended  Vitamin D: 1000 units a day recommended  Last vitamin D Lab Results  Component Value Date   VD25OH 33.36 02/29/2020  A/P:Hopefully stable-offered bone  density today versus next January- patient opts for next year.  Vitamin D has been well controlled- continue supplementation  #COPD/emphysema S: Compliant with Anoro--> stiolto due to expense.  Using albuterol very rarely  A/P: COPD remains well controlled-continue current medication  #Hyperlipidemia/history of coronary artery calcification-has seen Dr. Marigene Ehlers in the past #Aortic atherosclerosis S: Compliant with atorvastatin 40 mg.  Also on aspirin for primary prevention- planning to stop. Lab Results  Component Value Date   CHOL 186 02/29/2020   HDL 55.50 02/29/2020   LDLCALC 99 02/29/2020   LDLDIRECT 115.0 09/18/2015   TRIG 159.0 (H) 02/29/2020   CHOLHDL 3 02/29/2020   A/P: Patient on high-dose statin - I think it is reasonable to continue current medication as long as LDL below 100-we also discussed option of going to max dose statin- prefers to hold off .  I think is reasonable to continue aspirin with prior smoking history as well  as history of coronary artery calcification but on other hand with GI bleeding risk we opted to stop -For aortic atherosclerosis- continue risk factor modification  #Chronic low back pain S: Previously Followed with Dr. Carloyn Manner- he retired 2020- going to be with Dr. Sherley Bounds in [place].  Percocet through Dr. Carloyn Manner in past- now off  As long as she is careful.   We prescribed Mobic (takes 2-3 x a week and sparing aleve but not same day).  We will follow kidney function at least every 6 months A/P: glad she is doing reasonably well off percocet- continue nsaids as long as renal function stable. Discussed GI bleeding risk - opted to stop aspirin  #Elevated blood pressure last visit (but patient had recently lost her son at that time) S: medication:  None- plan was home monitoring Home readings #s: 130s/80s. We have checked her home cuff before she reports BP Readings from Last 3 Encounters:  08/31/20 (!) 160/88  07/04/20 126/76  05/23/20 (!) 159/92  A/P: white coat hypertension- home #s remain excellent-continue to monitor   % History of lung cancer with lobectomy in June 2010 - remains enrolled in lung cancer     Recommended follow up: Return in about 6 months (around 02/28/2021) for follow up- or sooner if needed.  Lab/Order associations:   ICD-10-CM   1. Preventative health care  Z00.00 CBC with Differential/Platelet    Comprehensive metabolic panel    LDL cholesterol, direct    POCT Urinalysis Dipstick (Automated)  2. Hyperlipidemia, unspecified hyperlipidemia type  E78.5 CBC with Differential/Platelet    Comprehensive metabolic panel    LDL cholesterol, direct    POCT Urinalysis Dipstick (Automated)  3. COPD without exacerbation (HCC) Chronic J44.9   4. Aortic atherosclerosis (HCC) Chronic I70.0   5. Coronary artery calcification  I25.10    I25.84   6. Age-related osteoporosis without current pathological fracture  M81.0   7. Chronic obstructive pulmonary disease, unspecified COPD type  ([place])  J44.9   8. Chronic low back pain, unspecified back pain laterality, unspecified whether sciatica present  M54.50    G89.29   9. White coat syndrome without diagnosis of hypertension  R03.0   10. Former smoker  Z87.891 POCT Urinalysis Dipstick (Automated)    No orders of the defined types were placed in this encounter.  Return precautions advised.  Garret Reddish, MD

## 2020-08-31 ENCOUNTER — Encounter: Payer: Self-pay | Admitting: Family Medicine

## 2020-08-31 ENCOUNTER — Ambulatory Visit (INDEPENDENT_AMBULATORY_CARE_PROVIDER_SITE_OTHER): Payer: Medicare PPO | Admitting: Family Medicine

## 2020-08-31 ENCOUNTER — Other Ambulatory Visit: Payer: Self-pay

## 2020-08-31 VITALS — BP 160/88 | HR 76 | Temp 98.6°F | Ht 63.0 in | Wt 140.8 lb

## 2020-08-31 DIAGNOSIS — I700 Atherosclerosis of aorta: Secondary | ICD-10-CM

## 2020-08-31 DIAGNOSIS — M545 Low back pain, unspecified: Secondary | ICD-10-CM | POA: Diagnosis not present

## 2020-08-31 DIAGNOSIS — M810 Age-related osteoporosis without current pathological fracture: Secondary | ICD-10-CM | POA: Diagnosis not present

## 2020-08-31 DIAGNOSIS — E785 Hyperlipidemia, unspecified: Secondary | ICD-10-CM | POA: Diagnosis not present

## 2020-08-31 DIAGNOSIS — I2510 Atherosclerotic heart disease of native coronary artery without angina pectoris: Secondary | ICD-10-CM | POA: Diagnosis not present

## 2020-08-31 DIAGNOSIS — J449 Chronic obstructive pulmonary disease, unspecified: Secondary | ICD-10-CM

## 2020-08-31 DIAGNOSIS — Z87891 Personal history of nicotine dependence: Secondary | ICD-10-CM | POA: Diagnosis not present

## 2020-08-31 DIAGNOSIS — I2584 Coronary atherosclerosis due to calcified coronary lesion: Secondary | ICD-10-CM

## 2020-08-31 DIAGNOSIS — Z0000 Encounter for general adult medical examination without abnormal findings: Secondary | ICD-10-CM | POA: Diagnosis not present

## 2020-08-31 DIAGNOSIS — G8929 Other chronic pain: Secondary | ICD-10-CM

## 2020-08-31 DIAGNOSIS — R030 Elevated blood-pressure reading, without diagnosis of hypertension: Secondary | ICD-10-CM | POA: Diagnosis not present

## 2020-08-31 LAB — CBC WITH DIFFERENTIAL/PLATELET
Basophils Absolute: 0.1 10*3/uL (ref 0.0–0.1)
Basophils Relative: 0.9 % (ref 0.0–3.0)
Eosinophils Absolute: 0.2 10*3/uL (ref 0.0–0.7)
Eosinophils Relative: 1.9 % (ref 0.0–5.0)
HCT: 43 % (ref 36.0–46.0)
Hemoglobin: 14.4 g/dL (ref 12.0–15.0)
Lymphocytes Relative: 25.9 % (ref 12.0–46.0)
Lymphs Abs: 2.1 10*3/uL (ref 0.7–4.0)
MCHC: 33.4 g/dL (ref 30.0–36.0)
MCV: 83.3 fl (ref 78.0–100.0)
Monocytes Absolute: 0.5 10*3/uL (ref 0.1–1.0)
Monocytes Relative: 6.1 % (ref 3.0–12.0)
Neutro Abs: 5.4 10*3/uL (ref 1.4–7.7)
Neutrophils Relative %: 65.2 % (ref 43.0–77.0)
Platelets: 323 10*3/uL (ref 150.0–400.0)
RBC: 5.16 Mil/uL — ABNORMAL HIGH (ref 3.87–5.11)
RDW: 13.8 % (ref 11.5–15.5)
WBC: 8.2 10*3/uL (ref 4.0–10.5)

## 2020-08-31 LAB — POC URINALSYSI DIPSTICK (AUTOMATED)
Bilirubin, UA: NEGATIVE
Blood, UA: NEGATIVE
Glucose, UA: NEGATIVE
Ketones, UA: NEGATIVE
Leukocytes, UA: NEGATIVE
Nitrite, UA: NEGATIVE
Protein, UA: NEGATIVE
Spec Grav, UA: 1.02 (ref 1.010–1.025)
Urobilinogen, UA: 0.2 E.U./dL
pH, UA: 6 (ref 5.0–8.0)

## 2020-08-31 LAB — COMPREHENSIVE METABOLIC PANEL
ALT: 10 U/L (ref 0–35)
AST: 19 U/L (ref 0–37)
Albumin: 4.4 g/dL (ref 3.5–5.2)
Alkaline Phosphatase: 82 U/L (ref 39–117)
BUN: 11 mg/dL (ref 6–23)
CO2: 27 mEq/L (ref 19–32)
Calcium: 9.4 mg/dL (ref 8.4–10.5)
Chloride: 105 mEq/L (ref 96–112)
Creatinine, Ser: 0.8 mg/dL (ref 0.40–1.20)
GFR: 74.35 mL/min (ref >60.00)
Glucose, Bld: 83 mg/dL (ref 70–99)
Potassium: 3.8 mEq/L (ref 3.5–5.1)
Sodium: 140 mEq/L (ref 135–145)
Total Bilirubin: 0.8 mg/dL (ref 0.2–1.2)
Total Protein: 7.1 g/dL (ref 6.0–8.3)

## 2020-08-31 LAB — LDL CHOLESTEROL, DIRECT: Direct LDL: 110 mg/dL

## 2020-08-31 NOTE — Addendum Note (Signed)
Addended by: Brandy Hale on: 08/31/2020 08:50 AM   Modules accepted: Orders

## 2020-09-26 ENCOUNTER — Other Ambulatory Visit: Payer: Self-pay

## 2020-09-26 ENCOUNTER — Ambulatory Visit: Payer: Medicare PPO | Admitting: Orthopedic Surgery

## 2020-09-26 ENCOUNTER — Ambulatory Visit: Payer: Medicare PPO

## 2020-09-26 ENCOUNTER — Encounter: Payer: Self-pay | Admitting: Orthopedic Surgery

## 2020-09-26 VITALS — BP 143/98 | HR 86 | Ht 63.0 in | Wt 146.0 lb

## 2020-09-26 DIAGNOSIS — M25511 Pain in right shoulder: Secondary | ICD-10-CM

## 2020-09-26 DIAGNOSIS — M7581 Other shoulder lesions, right shoulder: Secondary | ICD-10-CM | POA: Diagnosis not present

## 2020-09-26 DIAGNOSIS — G8929 Other chronic pain: Secondary | ICD-10-CM

## 2020-09-26 NOTE — Patient Instructions (Signed)
Instructions Following Joint Injections  In clinic today, you received an injection in one of your joints (sometimes more than one).  Occasionally, you can have some pain at the injection site, this is normal.  You can place ice at the injection site, or take over-the-counter medications such as Tylenol (acetaminophen) or Advil (ibuprofen).  Please follow all directions listed on the bottle.  If your joint (knee or shoulder) becomes swollen, red or very painful, please contact the clinic for additional assistance.   Two medications were injected, including lidocaine and a steroid (often referred to as cortisone).  Lidocaine is effective almost immediately but wears off quickly.  However, the steroid can take a few days to improve your symptoms.  In some cases, it can make your pain worse for a couple of days.  Do not be concerned if this happens as it is common.  You can apply ice or take some over-the-counter medications as needed.   If the injection helps with your pain, we can repeat an injection, but no sooner than 3 months from today.

## 2020-09-26 NOTE — Progress Notes (Signed)
New Patient Visit  Assessment: [name] is a 70 y.o. female with the following: Right shoulder rotator cuff tendinitis  Plan: Patient has right shoulder pain, and this been ongoing for the past 2 months.  We reviewed radiographs in clinic today which demonstrates mild glenohumeral arthritis.  On physical exam, she clearly has irritation of the rotator cuff, as well as some tightness of the posterior capsule of the shoulder.  We discussed all potential treatment options at this time.  She will continue to take meloxicam as needed, she has been taking this for her back.  In addition, I have encouraged her to work on some exercises for her shoulder, and she is elected to do this on her own at this time.  Exercises were provided.  I told her she should focus on the sleeper stretch, which can improve the flexibility in the posterior aspect of her shoulder.  If she has difficulty with these exercises, we can ultimately refer her to physical therapy.  We also discussed possibility proceeding with an injection in clinic today, and she did elect proceed.  All questions were answered, and she is amenable to this plan.  No follow-up is needed at this time.  Procedure note injection - Right Shoulder joint (subacromial space)     Verbal consent was obtained to inject the Right Shoulder joint  Timeout was completed to confirm the site of injection.  The skin was prepped with alcohol and ethyl chloride was sprayed at the injection site.  A 21-gauge needle was used to inject 40 mg of Depo-Medrol and 1% lidocaine (3 cc) into the Right Shoulder using an Posterolateral approach.  There were no complications. A sterile bandage was applied.   Follow-up: Return if symptoms worsen or fail to improve.  Subjective:  Chief Complaint  Patient presents with  . Shoulder Pain    Pt reports that its been hurting since christmas.     History of Present Illness: [name] is a 70 y.o. RHD female who  presents for evaluation of her right shoulder.  She has had pain, primarily posterior lateral aspect of her shoulder since just before Christmas.  She was doing some lifting at that time, and had some vague pains in the shoulder.  From there, it progressively got worse.  She been taking some meloxicam for back pain, and this does help with some of her shoulder pain.  Pain is worse at night.  She does have some radiating pains distally.  She did feel as though it was getting better, but then her pain worsened.  No previous injuries to the shoulder.  No previous injections.  No physical therapy.   Review of Systems: No fevers or chills No numbness or tingling No chest pain No shortness of breath No bowel or bladder dysfunction No GI distress No headaches   Medical History:  Past Medical History:  Diagnosis Date  . Complication of anesthesia    per pt, "hard to wake up past sedation".  . COPD (chronic obstructive pulmonary disease) ([place])   . Depression    previous rx, not in years  . Hyperlipidemia   . Lung cancer ([place]) 2010   Left upper lung- lobectomy  . Osteoporosis     Past Surgical History:  Procedure Laterality Date  . CHOLECYSTECTOMY    . LUNG CANCER SURGERY  2010   Left upper  . VAGINAL HYSTERECTOMY     including ovaries and fallopian tubes    Family History  Problem  Relation Age of Onset  . Epilepsy Mother        hurt at birth, birth trauma reltaed  . Hypertension Father   . CVA Father        hemorrhagic  . Other Brother        50 possible heart attack  . Lung cancer Brother   . Breast cancer Neg Hx    Social History   Tobacco Use  . Smoking status: Former Smoker    Packs/day: 1.00    Years: 40.00    Pack years: 40.00    Types: Cigarettes    Quit date: 12/17/2008    Years since quitting: 11.7  . Smokeless tobacco: Never Used  Substance Use Topics  . Alcohol use: No  . Drug use: No    Allergies  Allergen Reactions  . Codeine     REACTION: nausea   . Penicillins     REACTION: rash    Current Meds  Medication Sig  . albuterol (PROAIR HFA) 108 (90 Base) MCG/ACT inhaler Inhale 1-2 puffs into the lungs every 6 (six) hours as needed for wheezing or shortness of breath. Reported on 02/21/2016  . alendronate (FOSAMAX) 70 MG tablet TAKE 1 TABLET BY MOUTH EVERY 7 DAYS ON AN EMPTY STOMACH AND WITH A FULL GLASS OF WATER  . atorvastatin (LIPITOR) 40 MG tablet TAKE 1 TABLET(40 MG) BY MOUTH DAILY  . Calcium-Magnesium-Vitamin D 453-64-680 MG-MG-UNIT TB24 Take by mouth.  . meloxicam (MOBIC) 15 MG tablet TAKE 1 TABLET BY MOUTH DAILY AS NEEDED  . Tiotropium Bromide-Olodaterol (STIOLTO RESPIMAT) 2.5-2.5 MCG/ACT AERS Inhale 2 puffs into the lungs daily. Pt needs appt for further refills.    Objective: BP (!) 143/98   Pulse 86   Ht 5\' 3"  (1.6 m)   Wt 146 lb (66.2 kg)   BMI 25.86 kg/m   Physical Exam:  General: Alert and oriented, no acute distress Gait: Normal  Evaluation of the right shoulder demonstrates no atrophy.  No deformity is appreciated.  Near full range of motion with some discomfort in forward flexion, abduction and external rotation.  Restricted and painful internal rotation in the 90/90 position.  Positive drop arm test.  No pain with empty can testing.  5/5 strength supraspinatus and infraspinatus testing.  Negative belly press.    IMAGING: I personally ordered and reviewed the following images  X-rays of the right shoulder demonstrates mild loss of glenohumeral joint space, with small inferior osteophytes on both the glenoid, as well as the humeral head.  No proximal humeral migration.  No acute injury noted otherwise.  Impression: Mild right glenohumeral arthritis  New Medications:  No orders of the defined types were placed in this encounter.     Mordecai Rasmussen, MD  09/26/2020 1:15 PM

## 2020-10-06 ENCOUNTER — Other Ambulatory Visit: Payer: Self-pay | Admitting: Family Medicine

## 2020-10-09 ENCOUNTER — Encounter: Payer: Self-pay | Admitting: Orthopedic Surgery

## 2020-10-27 ENCOUNTER — Encounter: Payer: Self-pay | Admitting: Family Medicine

## 2020-11-02 ENCOUNTER — Encounter: Payer: Self-pay | Admitting: Family Medicine

## 2020-11-02 ENCOUNTER — Telehealth: Payer: Self-pay | Admitting: Family Medicine

## 2020-11-02 NOTE — Telephone Encounter (Signed)
Spoke with patient to  schedule Medicare Annual Wellness Visit (AWV) either virtually or in office.  She will call back to schedule     Last AWV 09/18/15  please schedule at anytime    This should be a 45 minute visit.

## 2020-11-14 ENCOUNTER — Other Ambulatory Visit: Payer: Self-pay | Admitting: Family Medicine

## 2021-01-08 ENCOUNTER — Ambulatory Visit: Payer: Medicare PPO | Attending: Internal Medicine

## 2021-01-08 ENCOUNTER — Other Ambulatory Visit: Payer: Self-pay

## 2021-01-08 ENCOUNTER — Other Ambulatory Visit (HOSPITAL_BASED_OUTPATIENT_CLINIC_OR_DEPARTMENT_OTHER): Payer: Self-pay

## 2021-01-08 DIAGNOSIS — Z23 Encounter for immunization: Secondary | ICD-10-CM

## 2021-01-08 MED ORDER — COVID-19 MRNA VACC (MODERNA) 100 MCG/0.5ML IM SUSP
INTRAMUSCULAR | 0 refills | Status: DC
  Filled 2021-01-08: qty 0.25, 1d supply, fill #0

## 2021-01-08 NOTE — Progress Notes (Signed)
   Covid-19 Vaccination Clinic  Name:  [name]    MRN: [id number] DOB: 1951/04/16  01/08/2021  [name] was observed post Covid-19 immunization for 15 minutes without incident. She was provided with Vaccine Information Sheet and instruction to access the V-Safe system.   [name] was instructed to call 911 with any severe reactions post vaccine: Marland Kitchen Difficulty breathing  . Swelling of face and throat  . A fast heartbeat  . A bad rash all over body  . Dizziness and weakness   Immunizations Administered    Name Date Dose VIS Date Route   Moderna Covid-19 Booster Vaccine 01/08/2021  1:56 PM 0.25 mL 06/07/2020 Intramuscular   Manufacturer: Moderna   Lot: [id number]   [place]: [id number]

## 2021-02-05 ENCOUNTER — Other Ambulatory Visit: Payer: Self-pay | Admitting: Family Medicine

## 2021-02-13 ENCOUNTER — Encounter: Payer: Self-pay | Admitting: Family Medicine

## 2021-02-26 ENCOUNTER — Other Ambulatory Visit: Payer: Self-pay

## 2021-02-26 ENCOUNTER — Ambulatory Visit: Payer: Medicare PPO | Admitting: Family Medicine

## 2021-02-26 ENCOUNTER — Encounter: Payer: Self-pay | Admitting: Family Medicine

## 2021-02-26 VITALS — BP 116/73 | HR 78 | Temp 98.0°F | Ht 63.0 in | Wt 140.4 lb

## 2021-02-26 DIAGNOSIS — G8929 Other chronic pain: Secondary | ICD-10-CM | POA: Diagnosis not present

## 2021-02-26 DIAGNOSIS — Z85118 Personal history of other malignant neoplasm of bronchus and lung: Secondary | ICD-10-CM | POA: Diagnosis not present

## 2021-02-26 DIAGNOSIS — M545 Low back pain, unspecified: Secondary | ICD-10-CM | POA: Diagnosis not present

## 2021-02-26 DIAGNOSIS — J449 Chronic obstructive pulmonary disease, unspecified: Secondary | ICD-10-CM

## 2021-02-26 DIAGNOSIS — M810 Age-related osteoporosis without current pathological fracture: Secondary | ICD-10-CM | POA: Diagnosis not present

## 2021-02-26 DIAGNOSIS — E559 Vitamin D deficiency, unspecified: Secondary | ICD-10-CM | POA: Diagnosis not present

## 2021-02-26 DIAGNOSIS — E785 Hyperlipidemia, unspecified: Secondary | ICD-10-CM

## 2021-02-26 LAB — CBC WITH DIFFERENTIAL/PLATELET
Basophils Absolute: 0 10*3/uL (ref 0.0–0.1)
Basophils Relative: 0.6 % (ref 0.0–3.0)
Eosinophils Absolute: 0.1 10*3/uL (ref 0.0–0.7)
Eosinophils Relative: 0.9 % (ref 0.0–5.0)
HCT: 42.6 % (ref 36.0–46.0)
Hemoglobin: 14.7 g/dL (ref 12.0–15.0)
Lymphocytes Relative: 27.4 % (ref 12.0–46.0)
Lymphs Abs: 1.9 10*3/uL (ref 0.7–4.0)
MCHC: 34.5 g/dL (ref 30.0–36.0)
MCV: 82.5 fl (ref 78.0–100.0)
Monocytes Absolute: 0.4 10*3/uL (ref 0.1–1.0)
Monocytes Relative: 6 % (ref 3.0–12.0)
Neutro Abs: 4.5 10*3/uL (ref 1.4–7.7)
Neutrophils Relative %: 65.1 % (ref 43.0–77.0)
Platelets: 297 10*3/uL (ref 150.0–400.0)
RBC: 5.16 Mil/uL — ABNORMAL HIGH (ref 3.87–5.11)
RDW: 13.7 % (ref 11.5–15.5)
WBC: 6.9 10*3/uL (ref 4.0–10.5)

## 2021-02-26 LAB — COMPREHENSIVE METABOLIC PANEL
ALT: 10 U/L (ref 0–35)
AST: 19 U/L (ref 0–37)
Albumin: 4.5 g/dL (ref 3.5–5.2)
Alkaline Phosphatase: 67 U/L (ref 39–117)
BUN: 12 mg/dL (ref 6–23)
CO2: 27 mEq/L (ref 19–32)
Calcium: 9.7 mg/dL (ref 8.4–10.5)
Chloride: 104 mEq/L (ref 96–112)
Creatinine, Ser: 0.76 mg/dL (ref 0.40–1.20)
GFR: 78.8 mL/min (ref >60.00)
Glucose, Bld: 85 mg/dL (ref 70–99)
Potassium: 3.9 mEq/L (ref 3.5–5.1)
Sodium: 140 mEq/L (ref 135–145)
Total Bilirubin: 0.8 mg/dL (ref 0.2–1.2)
Total Protein: 7.3 g/dL (ref 6.0–8.3)

## 2021-02-26 LAB — LIPID PANEL
Cholesterol: 160 mg/dL (ref 0–200)
HDL: 55.3 mg/dL (ref >39.00)
LDL Cholesterol: 85 mg/dL (ref 0–99)
NonHDL: 104.31
Total CHOL/HDL Ratio: 3
Triglycerides: 98 mg/dL (ref 0.0–149.0)
VLDL: 19.6 mg/dL (ref 0.0–40.0)

## 2021-02-26 LAB — VITAMIN D 25 HYDROXY (VIT D DEFICIENCY, FRACTURES): VITD: 47.48 ng/mL (ref 30.00–100.00)

## 2021-02-26 MED ORDER — MELOXICAM 15 MG PO TABS: 15.0000 mg | ORAL_TABLET | Freq: Every day | ORAL | 1 refills | Status: DC | PRN

## 2021-02-26 NOTE — Patient Instructions (Addendum)
Health Maintenance Due  Topic Date Due   Zoster Vaccines- Shingrix (1 of 2) Please check with your pharmacy to see if they have the shingrix vaccine. If they do- please get this immunization and update [place] by phone call or mychart with dates you receive the vaccine  Never done   Please stop by lab before you go If you have mychart- we will send your results within 3 business days of [place] receiving them.  If you do not have mychart- we will call you about results within 5 business days of [place] receiving them.  *please also note that you will see labs on mychart as soon as they post. I will later go in and write notes on them- will say "notes from Dr. Yong Channel"  Check blood pressures at home and let us know via [facility]. Goal 135/85  Recommended follow up: Return in about 6 months (around 08/29/2021) for physical-Has to be after Janurary 14th.

## 2021-02-26 NOTE — Progress Notes (Signed)
Phone [phone] In person visit   Subjective:   [name] is a 70 y.o. year old very pleasant female patient who presents for/with See problem oriented charting Chief Complaint  Patient presents with   Hyperlipidemia   COPD   Osteoporosis   This visit occurred during the SARS-CoV-2 public health emergency.  Safety protocols were in place, including screening questions prior to the visit, additional usage of staff PPE, and extensive cleaning of exam room while observing appropriate contact time as indicated for disinfecting solutions.   Past Medical History-  Patient Active Problem List   Diagnosis Date Noted   Coronary artery calcification 10/31/2012    Priority: High   White coat syndrome without diagnosis of hypertension 03/13/2015    Priority: Medium   Osteoporosis 09/05/2014    Priority: Medium   Chronic low back pain 09/05/2014    Priority: Medium   COPD (chronic obstructive pulmonary disease) ([place]) 06/06/2009    Priority: Medium   Encounter for follow-up surveillance of lung cancer 01/05/2009    Priority: Medium   Hyperlipidemia 06/29/2006    Priority: Medium   History of adenomatous polyp of colon 04/08/2018    Priority: Low   Ganglion cyst of finger of right hand 09/23/2016    Priority: Low   Former smoker 03/13/2015    Priority: Low   COPD, frequent exacerbations ([place]) 03/29/2011    Priority: Low   Pulmonary nodule, right 03/29/2011    Priority: Low   Aortic atherosclerosis ([place]) 06/05/2020   Acute non-recurrent sinusitis 02/20/2017    Medications- reviewed and updated Current Outpatient Medications  Medication Sig Dispense Refill   albuterol (PROAIR HFA) 108 (90 Base) MCG/ACT inhaler Inhale 1-2 puffs into the lungs every 6 (six) hours as needed for wheezing or shortness of breath. Reported on 02/21/2016 18 g 5   alendronate (FOSAMAX) 70 MG tablet TAKE 1 TABLET BY MOUTH EVERY 7 DAYS ON AN EMPTY STOMACH AND WITH A FULL GLASS OF WATER 8 tablet 1    atorvastatin (LIPITOR) 40 MG tablet TAKE 1 TABLET(40 MG) BY MOUTH DAILY 90 tablet 3   Calcium-Magnesium-Vitamin D 600-40-500 MG-MG-UNIT TB24 Take by mouth.     Tiotropium Bromide-Olodaterol (STIOLTO RESPIMAT) 2.5-2.5 MCG/ACT AERS Inhale 2 puffs into the lungs daily. Pt needs appt for further refills. 4 g 11   COVID-19 mRNA vaccine, Moderna, 100 MCG/0.5ML injection Inject into the muscle. 0.25 mL 0   meloxicam (MOBIC) 15 MG tablet Take 1 tablet (15 mg total) by mouth daily as needed. 90 tablet 1   No current facility-administered medications for this visit.     Objective:  BP (!) 154/80   Pulse 78   Temp 98 F (36.7 C) (Temporal)   Ht 5\' 3"  (1.6 m)   Wt 140 lb 6.4 oz (63.7 kg)   SpO2 98%   BMI 24.87 kg/m  Gen: NAD, resting comfortably CV: RRR no murmurs rubs or gallops Lungs: CTAB no crackles, wheeze, rhonchi Abdomen: soft/nontender/nondistended/normal bowel sounds. No rebound or guarding.  Ext: no edema Skin: warm, dry    Assessment and Plan   #Whitecoat hypertension-patient reports good blood pressures at home though has not checked super recently-she agrees to go home and check this today and update [place] through [facility].  Tends to run slightly high in the office such as today 154/80  #social update- lost a cousin to covid recently. Church is doing well overall other than some increased covid.   # Osteoporosis S: Last DEXA: 09/07/2018. consider repeat  08/2021 Medication (bisphosphonate or prolia): She has been on Fosamax since February 2018. Prior severe back pain with reclast. Plan 5 years therapy.  Calcium: 1200mg  (through diet ok) recommended  Vitamin D: 1000 units a day recommended  A/P:  Stable. Continue current medications.  Recheck dexa next visit  #COPD/emphysema S: Compliant with Anoro--> stiolto due to expense. Using albuterol- pretty much not using. Overall doing well - unless really humid A/P: Stable. Continue current medications.    #Hyperlipidemia/history of  coronary artery calcification/aortic atherosclerosis-has seen Dr. Marigene Ehlers in the past S: Compliant with atorvastatin 40 mg. -Peak LDL over 220 in the past Lab Results  Component Value Date   CHOL 186 02/29/2020   HDL 55.50 02/29/2020   LDLCALC 99 02/29/2020   LDLDIRECT 110.0 08/31/2020   TRIG 159.0 (H) 02/29/2020   CHOLHDL 3 02/29/2020   A/P: LDL above ideal goal on last check considering coronary artery calcifications-with that being said she has had over 50% reduction from peak and we have opted to continue current medications and work on lifestyle - down from 151 on home scales to 139 with weight watchers- hoping this will improve #s - her preference is not up meds -she prefers full lipid today even if there may be some cost  #Chronic low back pain S: Previously Followed with Dr. Carloyn Manner- he retired 2020- going to be with Dr. Sherley Bounds in [place]. Percocet through Dr. Carloyn Manner in past- now off As long as she is careful.   We prescribed Mobic (sparingly- otherwise takes aleve). We will follow kidney function at least every 6 months -also does some tylenol extra strength  Recently has been able to take some days off the meloxicam A/P: Back pain stable.  Continue current medication as long as no worsening renal function-update CMP today  % History of lung cancer with lobectomy in June 2010 still does lung cancer screening-will be due in October  # history of skin cancer- follows with Dr. Tarri Glenn   #Vitamin D deficiency S: Medication: 1000 units a day Last vitamin D Lab Results  Component Value Date   VD25OH 33.36 02/29/2020   A/P: Patient was low on vitamin D several years ago-we will update with labs today-thankfully looked good last year-for now continue current medication  Recommended follow up: Return in about 6 months (around 08/29/2021) for physical-Has to be after Janurary 14th.  Lab/Order associations:   ICD-10-CM   1. Age related osteoporosis, unspecified pathological fracture  presence  M81.0     2. Chronic obstructive pulmonary disease, unspecified COPD type ([place])  J44.9     3. Hyperlipidemia, unspecified hyperlipidemia type  E78.5 CBC with Differential/Platelet    Comprehensive metabolic panel    Lipid panel    4. Chronic low back pain, unspecified back pain laterality, unspecified whether sciatica present  M54.50    G89.29     5. History of lung cancer  Z85.118     6. Vitamin D deficiency  E55.9 Vitamin D (25 hydroxy)      Meds ordered this encounter  Medications   meloxicam (MOBIC) 15 MG tablet    Sig: Take 1 tablet (15 mg total) by mouth daily as needed.    Dispense:  90 tablet    Refill:  1   I,Alexis Bryant,acting as a scribe for Garret Reddish, MD.,have documented all relevant documentation on the behalf of Garret Reddish, MD,as directed by  Garret Reddish, MD while in the presence of Garret Reddish, MD.   I, Garret Reddish, MD,  have reviewed all documentation for this visit. The documentation on 02/26/21 for the exam, diagnosis, procedures, and orders are all accurate and complete.   Return precautions advised.  Garret Reddish, MD

## 2021-02-27 NOTE — Telephone Encounter (Signed)
BP added to most recent visit.

## 2021-03-28 ENCOUNTER — Encounter: Payer: Self-pay | Admitting: Family Medicine

## 2021-05-07 ENCOUNTER — Other Ambulatory Visit: Payer: Self-pay | Admitting: Family Medicine

## 2021-05-24 ENCOUNTER — Encounter: Payer: Self-pay | Admitting: Gastroenterology

## 2021-05-28 ENCOUNTER — Other Ambulatory Visit: Payer: Self-pay | Admitting: *Deleted

## 2021-05-28 DIAGNOSIS — Z87891 Personal history of nicotine dependence: Secondary | ICD-10-CM

## 2021-06-20 DIAGNOSIS — L570 Actinic keratosis: Secondary | ICD-10-CM | POA: Diagnosis not present

## 2021-06-25 ENCOUNTER — Ambulatory Visit (AMBULATORY_SURGERY_CENTER): Payer: Self-pay | Admitting: *Deleted

## 2021-06-25 ENCOUNTER — Other Ambulatory Visit: Payer: Self-pay

## 2021-06-25 VITALS — Ht 63.0 in | Wt 139.0 lb

## 2021-06-25 DIAGNOSIS — Z86010 Personal history of colonic polyps: Secondary | ICD-10-CM

## 2021-06-25 MED ORDER — NA SULFATE-K SULFATE-MG SULF 17.5-3.13-1.6 GM/177ML PO SOLN: 1.0000 | ORAL | 0 refills | Status: DC

## 2021-06-25 NOTE — Progress Notes (Signed)
Patient is here in-person for PV. Patient denies any allergies to eggs or soy. Patient denies any problems with anesthesia/sedation. Patient is not on any oxygen at home. Patient is not taking any diet/weight loss medications or blood thinners. Patient is aware of our care-partner policy and [id number] safety protocol.   EMMI education assigned to the patient for the procedure, sent to [place].   Patient is COVID-19 vaccinated.

## 2021-06-27 ENCOUNTER — Ambulatory Visit (HOSPITAL_COMMUNITY)
Admission: RE | Admit: 2021-06-27 | Discharge: 2021-06-27 | Disposition: A | Discharge status: Home / Self Care | Payer: Medicare PPO | Source: Ambulatory Visit | Attending: Acute Care | Admitting: Acute Care

## 2021-06-27 ENCOUNTER — Other Ambulatory Visit: Payer: Self-pay

## 2021-06-27 DIAGNOSIS — I2510 Atherosclerotic heart disease of native coronary artery without angina pectoris: Secondary | ICD-10-CM | POA: Insufficient documentation

## 2021-06-27 DIAGNOSIS — Z122 Encounter for screening for malignant neoplasm of respiratory organs: Secondary | ICD-10-CM | POA: Diagnosis not present

## 2021-06-27 DIAGNOSIS — Z87891 Personal history of nicotine dependence: Secondary | ICD-10-CM | POA: Diagnosis not present

## 2021-06-27 DIAGNOSIS — I700 Atherosclerosis of aorta: Secondary | ICD-10-CM | POA: Diagnosis not present

## 2021-07-02 ENCOUNTER — Encounter: Payer: Self-pay | Admitting: Family Medicine

## 2021-07-09 ENCOUNTER — Other Ambulatory Visit: Payer: Self-pay | Admitting: Acute Care

## 2021-07-09 DIAGNOSIS — Z87891 Personal history of nicotine dependence: Secondary | ICD-10-CM

## 2021-07-17 ENCOUNTER — Other Ambulatory Visit: Payer: Self-pay

## 2021-07-17 ENCOUNTER — Encounter: Payer: Self-pay | Admitting: Gastroenterology

## 2021-07-17 ENCOUNTER — Ambulatory Visit (AMBULATORY_SURGERY_CENTER): Payer: Medicare PPO | Admitting: Gastroenterology

## 2021-07-17 VITALS — BP 119/78 | HR 77 | Temp 97.3°F | Resp 14 | Ht 63.0 in | Wt 139.0 lb

## 2021-07-17 DIAGNOSIS — K635 Polyp of colon: Secondary | ICD-10-CM | POA: Diagnosis not present

## 2021-07-17 DIAGNOSIS — D125 Benign neoplasm of sigmoid colon: Secondary | ICD-10-CM | POA: Diagnosis not present

## 2021-07-17 DIAGNOSIS — J449 Chronic obstructive pulmonary disease, unspecified: Secondary | ICD-10-CM | POA: Diagnosis not present

## 2021-07-17 DIAGNOSIS — Z86010 Personal history of colonic polyps: Secondary | ICD-10-CM

## 2021-07-17 MED ORDER — SODIUM CHLORIDE 0.9 % IV SOLN: 500.0000 mL | Freq: Once | INTRAVENOUS | Status: DC

## 2021-07-17 NOTE — Progress Notes (Signed)
Called to room to assist during endoscopic procedure.  Patient ID and intended procedure confirmed with present staff. Received instructions for my participation in the procedure from the performing physician.  

## 2021-07-17 NOTE — Progress Notes (Signed)
Report to PACU, RN, vss, BBS= Clear.  

## 2021-07-17 NOTE — Progress Notes (Signed)
Pt's states no medical or surgical changes since previsit or office visit. 

## 2021-07-17 NOTE — Progress Notes (Signed)
[facility] History and Physical   Primary Care Physician:  Marin Olp, MD   Reason for Procedure:   History of polyps  Plan:     colonoscopy     HPI: [name] is a 70 y.o. female   colonoscopy for history of polyps  Past Medical History:  Diagnosis Date   Complication of anesthesia    per pt, "hard to wake up past sedation".   COPD (chronic obstructive pulmonary disease) ([place])    Depression    previous rx, not in years   Hyperlipidemia    Lung cancer ([place]) 2010   Left upper lung- lobectomy   Osteoporosis     Past Surgical History:  Procedure Laterality Date   CHOLECYSTECTOMY     COLONOSCOPY  2019   [place]   LUNG CANCER SURGERY  2010   Left upper   POLYPECTOMY     VAGINAL HYSTERECTOMY     including ovaries and fallopian tubes    Prior to Admission medications   Medication Sig Start Date End Date Taking? Authorizing Provider  alendronate (FOSAMAX) 70 MG tablet TAKE 1 TABLET BY MOUTH EVERY 7 DAYS ON AN EMPTY STOMACH AND WITH A FULL GLASS OF WATER 05/08/21  Yes Marin Olp, MD  alendronate (FOSAMAX) 70 MG tablet TAKE 1 TABLET BY MOUTH EVERY 7 DAYS ON AN EMPTY STOMACH AND WITH A FULL GLASS OF WATER 05/08/21  Yes Marin Olp, MD  atorvastatin (LIPITOR) 40 MG tablet TAKE 1 TABLET(40 MG) BY MOUTH DAILY 08/17/20  Yes Marin Olp, MD  Calcium-Magnesium-Vitamin D 546-27-035 MG-MG-UNIT TB24 Take by mouth.   Yes [provider]  meloxicam (MOBIC) 15 MG tablet Take 1 tablet (15 mg total) by mouth daily as needed. 02/26/21  Yes Marin Olp, MD  Tiotropium Bromide-Olodaterol (STIOLTO RESPIMAT) 2.5-2.5 MCG/ACT AERS Inhale 2 puffs into the lungs daily. Pt needs appt for further refills. 07/04/20  Yes Brand Males, MD  acetaminophen (TYLENOL) 500 MG tablet Take 500 mg by mouth every 6 (six) hours as needed.    [provider]  albuterol (PROAIR HFA) 108 (90 Base) MCG/ACT inhaler Inhale 1-2 puffs into the lungs  every 6 (six) hours as needed for wheezing or shortness of breath. Reported on 02/21/2016 07/04/20   Brand Males, MD    Current Outpatient Medications  Medication Sig Dispense Refill   alendronate (FOSAMAX) 70 MG tablet TAKE 1 TABLET BY MOUTH EVERY 7 DAYS ON AN EMPTY STOMACH AND WITH A FULL GLASS OF WATER 8 tablet 1   alendronate (FOSAMAX) 70 MG tablet TAKE 1 TABLET BY MOUTH EVERY 7 DAYS ON AN EMPTY STOMACH AND WITH A FULL GLASS OF WATER 8 tablet 1   atorvastatin (LIPITOR) 40 MG tablet TAKE 1 TABLET(40 MG) BY MOUTH DAILY 90 tablet 3   Calcium-Magnesium-Vitamin D 600-40-500 MG-MG-UNIT TB24 Take by mouth.     meloxicam (MOBIC) 15 MG tablet Take 1 tablet (15 mg total) by mouth daily as needed. 90 tablet 1   Tiotropium Bromide-Olodaterol (STIOLTO RESPIMAT) 2.5-2.5 MCG/ACT AERS Inhale 2 puffs into the lungs daily. Pt needs appt for further refills. 4 g 11   acetaminophen (TYLENOL) 500 MG tablet Take 500 mg by mouth every 6 (six) hours as needed.     albuterol (PROAIR HFA) 108 (90 Base) MCG/ACT inhaler Inhale 1-2 puffs into the lungs every 6 (six) hours as needed for wheezing or shortness of breath. Reported on 02/21/2016 18 g 5   Current Facility-Administered Medications  Medication  Dose Route Frequency Provider Last Rate Last Admin   0.9 %  sodium chloride infusion  500 mL Intravenous Once Jackquline Denmark, MD        Allergies as of 07/17/2021 - Review Complete 07/17/2021  Allergen Reaction Noted   Codeine     Penicillins      Family History  Problem Relation Age of Onset   Epilepsy Mother        hurt at birth, birth trauma reltaed   Hypertension Father    CVA Father        hemorrhagic   Other Brother        5 possible heart attack   Lung cancer Brother    Breast cancer Neg Hx    Colon cancer Neg Hx    Esophageal cancer Neg Hx    Stomach cancer Neg Hx     Social History   Socioeconomic History   Marital status: Married    Spouse name: Not on file   Number of children: Not on  file   Years of education: Not on file   Highest education level: Not on file  Occupational History   Not on file  Tobacco Use   Smoking status: Former    Packs/day: 1.00    Years: 40.00    Pack years: 40.00    Types: Cigarettes    Quit date: 12/17/2008    Years since quitting: 12.5   Smokeless tobacco: Never  Vaping Use   Vaping Use: Never used  Substance and Sexual Activity   Alcohol use: No   Drug use: No   Sexual activity: Not on file  Other Topics Concern   Not on file  Social History Narrative   Married (husband Truman Hayward also Dr. Yong Channel patient) 1987. 3 children (2 living, son died 25-Dec-2019 unfortunately). 3 grandchildren.       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       Hobbies: shopping (husband will go with her) for grandkids.    Social Determinants of Health   Financial Resource Strain: Not on file  Food Insecurity: Not on file  Transportation Needs: Not on file  Physical Activity: Not on file  Stress: Not on file  Social Connections: Not on file  Intimate Partner Violence: Not on file    Review of Systems: Positive for none All other review of systems negative except as mentioned in the HPI.  Physical Exam: Vital signs in last 24 hours: @VSRANGES @   General:   Alert,  Well-developed, well-nourished, pleasant and cooperative in NAD Lungs:  Clear throughout to auscultation.   Heart:  Regular rate and rhythm; no murmurs, clicks, rubs,  or gallops. Abdomen:  Soft, nontender and nondistended. Normal bowel sounds.   Neuro/Psych:  Alert and cooperative. Normal mood and affect. A and O x 3    No significant changes were identified.  The patient continues to be an appropriate candidate for the planned procedure and anesthesia.   Carmell Austria, MD. [facility] 07/17/2021 1:53 PM@

## 2021-07-17 NOTE — Op Note (Signed)
[facility] Patient Name: [name] Procedure Date: 07/17/2021 1:54 PM MRN: [id number] Endoscopist: Jackquline Denmark , MD Age: 70 Referring MD:  Date of Birth: 30-Oct-1950 Gender: Female Account #: [id number] Procedure:                Colonoscopy Indications:              High risk colon cancer surveillance: Personal                            history of colonic polyps Medicines:                Monitored Anesthesia Care Procedure:                Pre-Anesthesia Assessment:                           - Prior to the procedure, a History and Physical                            was performed, and patient medications and                            allergies were reviewed. The patient's tolerance of                            previous anesthesia was also reviewed. The risks                            and benefits of the procedure and the sedation                            options and risks were discussed with the patient.                            All questions were answered, and informed consent                            was obtained. Prior Anticoagulants: The patient has                            taken no previous anticoagulant or antiplatelet                            agents. ASA Grade Assessment: II - A patient with                            mild systemic disease. After reviewing the risks                            and benefits, the patient was deemed in                            satisfactory condition to undergo the procedure.  After obtaining informed consent, the colonoscope                            was passed under direct vision. Throughout the                            procedure, the patient's blood pressure, pulse, and                            oxygen saturations were monitored continuously. The                            Olympus PCF-H190DL (#[id number]) Colonoscope was                            introduced through the anus and advanced to  the the                            cecum, identified by appendiceal orifice and                            ileocecal valve. The colonoscopy was performed                            without difficulty. The patient tolerated the                            procedure well. The quality of the bowel                            preparation was good. The ileocecal valve,                            appendiceal orifice, and rectum were photographed. Scope In: 1:59:55 PM Scope Out: 2:10:30 PM Scope Withdrawal Time: 0 hours 6 minutes 31 seconds  Total Procedure Duration: 0 hours 10 minutes 35 seconds  Findings:                 Two sessile polyps were found in the distal sigmoid                            colon. The polyps were 4 mm in size. These polyps                            were removed with a cold snare. Resection and                            retrieval were complete.                           Multiple medium-mouthed diverticula were found in                            the sigmoid colon, descending colon and few  ascending colon.                           Non-bleeding internal hemorrhoids were found during                            retroflexion. The hemorrhoids were small and Grade                            I (internal hemorrhoids that do not prolapse).                           The exam was otherwise without abnormality on                            direct and retroflexion views. Complications:            No immediate complications. Estimated Blood Loss:     Estimated blood loss: none. Impression:               - Two 4 mm polyps in the distal sigmoid colon,                            removed with a cold snare. Resected and retrieved.                           - Diverticulosis in the sigmoid colon, in the                            descending colon and few in the ascending colon.                           - Non-bleeding internal hemorrhoids.                            - The examination was otherwise normal on direct                            and retroflexion views. Recommendation:           - Patient has a contact number available for                            emergencies. The signs and symptoms of potential                            delayed complications were discussed with the                            patient. Return to normal activities tomorrow.                            Written discharge instructions were provided to the                            patient.                           -  Resume previous diet.                           - Continue present medications.                           - Await pathology results.                           - Repeat colonoscopy for surveillance based on                            pathology results.                           - The findings and recommendations were discussed                            with the patient's family. Jackquline Denmark, MD 07/17/2021 2:18:11 PM This report has been signed electronically.

## 2021-07-17 NOTE — Patient Instructions (Signed)
Handouts given for diverticulosis and polyps.  YOU HAD AN ENDOSCOPIC PROCEDURE TODAY AT [facility]:   Refer to the procedure report that was given to you for any specific questions about what was found during the examination.  If the procedure report does not answer your questions, please call your gastroenterologist to clarify.  If you requested that your care partner not be given the details of your procedure findings, then the procedure report has been included in a sealed envelope for you to review at your convenience later.  YOU SHOULD EXPECT: Some feelings of bloating in the abdomen. Passage of more gas than usual.  Walking can help get rid of the air that was put into your GI tract during the procedure and reduce the bloating. If you had a lower endoscopy (such as a colonoscopy or flexible sigmoidoscopy) you may notice spotting of blood in your stool or on the toilet paper. If you underwent a bowel prep for your procedure, you may not have a normal bowel movement for a few days.  Please Note:  You might notice some irritation and congestion in your nose or some drainage.  This is from the oxygen used during your procedure.  There is no need for concern and it should clear up in a day or so.  SYMPTOMS TO REPORT IMMEDIATELY:  Following lower endoscopy (colonoscopy):  Excessive amounts of blood in the stool  Significant tenderness or worsening of abdominal pains  Swelling of the abdomen that is new, acute  Fever of 100F or higher  For urgent or emergent issues, a gastroenterologist can be reached at any hour by calling [phone]. Do not use MyChart messaging for urgent concerns.    DIET:  We do recommend a small meal at first, but then you may proceed to your regular diet.  Drink plenty of fluids but you should avoid alcoholic beverages for 24 hours.  ACTIVITY:  You should plan to take it easy for the rest of today and you should NOT DRIVE or use heavy machinery  until tomorrow (because of the sedation medicines used during the test).    FOLLOW UP: Our staff will call the number listed on your records 48-72 hours following your procedure to check on you and address any questions or concerns that you may have regarding the information given to you following your procedure. If we do not reach you, we will leave a message.  We will attempt to reach you two times.  During this call, we will ask if you have developed any symptoms of COVID 19. If you develop any symptoms (ie: fever, flu-like symptoms, shortness of breath, cough etc.) before then, please call [phone].  If you test positive for Covid 19 in the 2 weeks post procedure, please call and report this information to [place].    The polyps/biopsies were taken and  you will be contacted by phone or by letter within the next 1-3 weeks.  Please call us at [phone] if you have not heard about the biopsies in 3 weeks.    SIGNATURES/CONFIDENTIALITY: You and/or your care partner have signed paperwork which will be entered into your electronic medical record.  These signatures attest to the fact that that the information above on your After Visit Summary has been reviewed and is understood.  Full responsibility of the confidentiality of this discharge information lies with you and/or your care-partner.

## 2021-07-19 ENCOUNTER — Telehealth: Payer: Self-pay

## 2021-07-19 NOTE — Telephone Encounter (Signed)
  Follow up Call-  Call back number 07/17/2021  Post procedure Call Back phone  # [phone]  Permission to leave phone message Yes  Some recent data might be hidden     Patient questions:  Do you have a fever, pain , or abdominal swelling? No. Pain Score  0 *  Have you tolerated food without any problems? Yes.    Have you been able to return to your normal activities? Yes.    Do you have any questions about your discharge instructions: Diet   No. Medications  No. Follow up visit  No.  Do you have questions or concerns about your Care? No.  Actions: * If pain score is 4 or above: No action needed, pain <4.

## 2021-07-22 ENCOUNTER — Encounter: Payer: Self-pay | Admitting: Gastroenterology

## 2021-07-29 ENCOUNTER — Other Ambulatory Visit: Payer: Self-pay | Admitting: Internal Medicine

## 2021-08-31 ENCOUNTER — Other Ambulatory Visit: Payer: Self-pay | Admitting: Family Medicine

## 2021-09-04 ENCOUNTER — Ambulatory Visit (INDEPENDENT_AMBULATORY_CARE_PROVIDER_SITE_OTHER): Payer: Medicare PPO | Admitting: Family Medicine

## 2021-09-04 ENCOUNTER — Encounter: Payer: Self-pay | Admitting: Family Medicine

## 2021-09-04 ENCOUNTER — Other Ambulatory Visit: Payer: Self-pay

## 2021-09-04 VITALS — BP 130/78 | HR 82 | Temp 97.9°F | Ht 63.0 in | Wt 138.4 lb

## 2021-09-04 DIAGNOSIS — M810 Age-related osteoporosis without current pathological fracture: Secondary | ICD-10-CM | POA: Diagnosis not present

## 2021-09-04 DIAGNOSIS — Z87891 Personal history of nicotine dependence: Secondary | ICD-10-CM

## 2021-09-04 DIAGNOSIS — E785 Hyperlipidemia, unspecified: Secondary | ICD-10-CM | POA: Diagnosis not present

## 2021-09-04 DIAGNOSIS — Z1231 Encounter for screening mammogram for malignant neoplasm of breast: Secondary | ICD-10-CM | POA: Diagnosis not present

## 2021-09-04 DIAGNOSIS — I700 Atherosclerosis of aorta: Secondary | ICD-10-CM

## 2021-09-04 DIAGNOSIS — J449 Chronic obstructive pulmonary disease, unspecified: Secondary | ICD-10-CM

## 2021-09-04 DIAGNOSIS — Z0000 Encounter for general adult medical examination without abnormal findings: Secondary | ICD-10-CM | POA: Diagnosis not present

## 2021-09-04 LAB — COMPREHENSIVE METABOLIC PANEL
ALT: 8 U/L (ref 0–35)
AST: 17 U/L (ref 0–37)
Albumin: 4.6 g/dL (ref 3.5–5.2)
Alkaline Phosphatase: 81 U/L (ref 39–117)
BUN: 15 mg/dL (ref 6–23)
CO2: 30 mEq/L (ref 19–32)
Calcium: 9.9 mg/dL (ref 8.4–10.5)
Chloride: 103 mEq/L (ref 96–112)
Creatinine, Ser: 0.78 mg/dL (ref 0.40–1.20)
GFR: 76.1 mL/min (ref >60.00)
Glucose, Bld: 98 mg/dL (ref 70–99)
Potassium: 5.4 mEq/L — ABNORMAL HIGH (ref 3.5–5.1)
Sodium: 141 mEq/L (ref 135–145)
Total Bilirubin: 0.7 mg/dL (ref 0.2–1.2)
Total Protein: 7.3 g/dL (ref 6.0–8.3)

## 2021-09-04 LAB — CBC WITH DIFFERENTIAL/PLATELET
Basophils Absolute: 0 10*3/uL (ref 0.0–0.1)
Basophils Relative: 0.4 % (ref 0.0–3.0)
Eosinophils Absolute: 0.1 10*3/uL (ref 0.0–0.7)
Eosinophils Relative: 0.8 % (ref 0.0–5.0)
HCT: 45.4 % (ref 36.0–46.0)
Hemoglobin: 15.1 g/dL — ABNORMAL HIGH (ref 12.0–15.0)
Lymphocytes Relative: 24.2 % (ref 12.0–46.0)
Lymphs Abs: 1.8 10*3/uL (ref 0.7–4.0)
MCHC: 33.2 g/dL (ref 30.0–36.0)
MCV: 83.7 fl (ref 78.0–100.0)
Monocytes Absolute: 0.4 10*3/uL (ref 0.1–1.0)
Monocytes Relative: 5.9 % (ref 3.0–12.0)
Neutro Abs: 5 10*3/uL (ref 1.4–7.7)
Neutrophils Relative %: 68.7 % (ref 43.0–77.0)
Platelets: 305 10*3/uL (ref 150.0–400.0)
RBC: 5.43 Mil/uL — ABNORMAL HIGH (ref 3.87–5.11)
RDW: 13.8 % (ref 11.5–15.5)
WBC: 7.3 10*3/uL (ref 4.0–10.5)

## 2021-09-04 LAB — POC URINALSYSI DIPSTICK (AUTOMATED)
Bilirubin, UA: NEGATIVE
Blood, UA: NEGATIVE
Glucose, UA: NEGATIVE
Ketones, UA: NEGATIVE
Leukocytes, UA: NEGATIVE
Nitrite, UA: NEGATIVE
Protein, UA: NEGATIVE
Spec Grav, UA: 1.025 (ref 1.010–1.025)
Urobilinogen, UA: 0.2 E.U./dL
pH, UA: 6 (ref 5.0–8.0)

## 2021-09-04 LAB — LDL CHOLESTEROL, DIRECT: Direct LDL: 104 mg/dL

## 2021-09-04 NOTE — Progress Notes (Signed)
Phone [phone]   Subjective:  Patient presents today for their annual physical. Chief complaint-noted.   See problem oriented charting- ROS- full  review of systems was completed and negative except for: anxiety at times wakes up around 2 or 3 going to bed around 10 or 11, still with back pain  The following were reviewed and entered/updated in epic: Past Medical History:  Diagnosis Date   Complication of anesthesia    per pt, "hard to wake up past sedation".   COPD (chronic obstructive pulmonary disease) (HCC)    Depression    previous rx, not in years   Hyperlipidemia    Lung cancer ([facility]) 2010   Left upper lung- lobectomy   Osteoporosis    Patient Active Problem List   Diagnosis Date Noted   Coronary artery calcification 10/31/2012    Priority: High   Aortic atherosclerosis ([facility]) 06/05/2020    Priority: Medium    White coat syndrome without diagnosis of hypertension 03/13/2015    Priority: Medium    Osteoporosis 09/05/2014    Priority: Medium    Chronic low back pain 09/05/2014    Priority: Medium    COPD (chronic obstructive pulmonary disease) ([facility]) 06/06/2009    Priority: Medium    Encounter for follow-up surveillance of lung cancer 01/05/2009    Priority: Medium    Hyperlipidemia 06/29/2006    Priority: Medium    History of adenomatous polyp of colon 04/08/2018    Priority: Low   Ganglion cyst of finger of right hand 09/23/2016    Priority: Low   Former smoker 03/13/2015    Priority: Low   COPD, frequent exacerbations ([place] [facility] [place]) 03/29/2011    Priority: Low   Pulmonary nodule, right 03/29/2011    Priority: Low   Acute non-recurrent sinusitis 02/20/2017   Past Surgical History:  Procedure Laterality Date   CHOLECYSTECTOMY     COLONOSCOPY  2019   Dr.Gupta   LUNG CANCER SURGERY  2010   Left upper   POLYPECTOMY     VAGINAL HYSTERECTOMY     including ovaries and fallopian tubes    Family History  Problem Relation Age of Onset   Epilepsy Mother         hurt at birth, birth trauma reltaed   Hypertension Father    CVA Father        hemorrhagic   Other Brother        49 possible heart attack   Lung cancer Brother    Breast cancer Neg Hx    Colon cancer Neg Hx    Esophageal cancer Neg Hx    Stomach cancer Neg Hx     Medications- reviewed and updated Current Outpatient Medications  Medication Sig Dispense Refill   acetaminophen (TYLENOL) 500 MG tablet Take 500 mg by mouth every 6 (six) hours as needed.     albuterol (PROAIR HFA) 108 (90 Base) MCG/ACT inhaler Inhale 1-2 puffs into the lungs every 6 (six) hours as needed for wheezing or shortness of breath. Reported on 02/21/2016 18 g 5   alendronate (FOSAMAX) 70 MG tablet TAKE 1 TABLET BY MOUTH EVERY 7 DAYS ON AN EMPTY STOMACH AND WITH A FULL GLASS OF WATER 8 tablet 1   atorvastatin (LIPITOR) 40 MG tablet TAKE 1 TABLET(40 MG) BY MOUTH DAILY 90 tablet 3   Calcium-Magnesium-Vitamin D 600-40-500 MG-MG-UNIT TB24 Take by mouth.     meloxicam (MOBIC) 15 MG tablet Take 1 tablet (15 mg total) by mouth daily as needed. 90 tablet 1  Tiotropium Bromide-Olodaterol (STIOLTO RESPIMAT) 2.5-2.5 MCG/ACT AERS Inhale 2 puffs into the lungs daily. Pt needs appt for further refills. 4 g 11   No current facility-administered medications for this visit.    Allergies-reviewed and updated Allergies  Allergen Reactions   Codeine     REACTION: nausea   Penicillins     REACTION: rash    Social History   Social History Narrative   Married (husband Truman Hayward also Dr. Yong Channel patient) 1987. 3 children (2 living, son died 12/09/2019 unfortunately). 3 grandchildren.       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       Hobbies: shopping (husband will go with her) for grandkids.    Objective  Objective:  BP 130/78    Pulse 82    Temp 97.9 F (36.6 C)    Ht _0  (1.6 m)    Wt 138 lb 6.4 oz (62.8 kg)    SpO2 98%    BMI 24.52 kg/m  Gen: NAD, resting comfortably HEENT: Mucous  membranes are moist. Oropharynx normal Neck: no thyromegaly CV: RRR no murmurs rubs or gallops Lungs: CTAB no crackles, wheeze, rhonchi Abdomen: soft/nontender/nondistended/normal bowel sounds. No rebound or guarding.  Ext: no edema Skin: warm, dry Neuro: grossly normal, moves all extremities, PERRLA   Assessment and Plan   71 y.o. female presenting for annual physical.  Health Maintenance counseling: 1. Anticipatory guidance: Patient counseled regarding regular dental exams -q6 months advised , eye exams -yearly advised,  avoiding smoking and second hand smoke, limiting alcohol to 1 beverage per day-none , no illicit drugs.   2. Risk factor reduction:  Advised patient of need for regular exercise and diet rich and fruits and vegetables to reduce risk of heart attack and stroke.  Exercise- strongly encouraged weight bearing exercise for osteoporosis- advied 30 mins a day  Diet/weight management-weight largely stable Wt Readings from Last 3 Encounters:  09/04/21 138 lb 6.4 oz (62.8 kg)  07/17/21 139 lb (63 kg)  06/25/21 139 lb (63 kg)  3. Immunizations/screenings/ancillary studies- discussed Shingrix-recommended at pharmacy and Omicron/Bivalent booster-plans to do this soon- send [place] ddate- otherwise immunizations are up-to-date. Immunization History  Administered Date(s) Administered   Fluad Quad(high Dose 65+) 07/04/2020   Influenza Split 05/20/2012   Influenza Whole 06/06/2009, 04/15/2012   Influenza, High Dose Seasonal PF 05/26/2018, 04/19/2019, 04/19/2019, 07/02/2021   Influenza-Unspecified 06/15/2013, 08/09/2014, 07/21/2015, 07/16/2016, 07/14/2017, 05/26/2018   Moderna SARS-COV2 Booster Vaccination 08/24/2020, 01/08/2021   Moderna Sars-Covid-2 Vaccination 11/10/2019, 12/08/2019, 08/24/2020   PNEUMOCOCCAL CONJUGATE-20 10/31/2020   Pneumococcal Conjugate-13 09/22/2014   Pneumococcal Polysaccharide-23 02/21/2016   Tdap 09/05/2014  4. Cervical cancer screening- no history abnormal  pap smear. Past age based screening recommendations. No blood or discharge noted from vagina . 5. Breast cancer screening-  breast exam- prefers self exams and mammogram last done 06/12/2020 and yearly recommended so slightly overdue- ordered today  6. Colon cancer screening - last colonoscopy 07/17/2021 and was told no more colonoscopies needed due to age-Per guidelines-has had prior polyps 7. Skin cancer screening- follows with Dr. Tarri Glenn in [place] as has history of skin cancer. Advised regular sunscreen use. Denies worrisome, changing, or new skin lesions.  8. Birth control/STD check- monogamous/postmenopausal 9. Osteoporosis screening at 41- see discussion  below. 10. Smoking associated screening - former smoker- 40 pack years and quit in 2010. Enrolled in lung cancer screening - will get UA today with labs  Status of chronic or acute concerns   #  social update- will be seeing Toby Mac soon! Excited for this  # Osteoporosis S: Last DEXA: 09/07/2018. consider repeat 08/2021  Medication (bisphosphonate or prolia): She has been on Fosamax since February 2018. Prior severe back pain with reclast. Plan 5 years therapy.   Calcium: 1284m (through diet ok) recommended - taking Vitamin D: 1000 units a day recommended -taking A/P: We will update bone density-since she started medication 5 years ago likely hold medication for at least 5 years if possible. She will finish her last fosamax last week of January   #COPD/emphysema S: Compliant with Anoro--> stiolto due to expense. Using albuterol- very sparingly- inhaler goes out of date A/P: doing well- continue current meds   #Hyperlipidemia/coronary artery calcification #Aortic atherosclerosis-has seen Dr. MMarigene Ehlersin the past S: medication: Compliant with atorvastatin 40 mg. Lab Results  Component Value Date   CHOL 160 02/26/2021   HDL 55.30 02/26/2021   LDLCALC 85 02/26/2021   LDLDIRECT 110.0 08/31/2020   TRIG 98.0 02/26/2021   CHOLHDL 3  02/26/2021  A/P: LDL close to ideal goal of 70 coronary artery calcification and aortic atherosclerosis (suspect stable) or less on last labs at 85-she has wanted to work on lifestyle and we can recheck direct LDL with labs  #Chronic low back pain S: Previously Followed with Dr. RCarloyn Manner he retired 2020- [place]ercocet through Dr. RCarloyn Mannerin past- now off As long as she is careful.   We prescribed Mobic (sparingly- otherwise takes aleve). We will follow kidney function at least every 6 months -also does some tylenol extra strength  Last visit patient was able to take some days off the meloxicam. A/P: Back pain overall stable.  Glad she has been able to tolerate without narcotics.  Monitor renal function  #History of lung cancer with lobectomy in June 2010 still does lung cancer screening most recently 06/27/2021  # anxiety with sleep/insomnia S:about once every 2 weeks will wake up around 2 or 3  AM after going to bed at 10 or 11. Sometimes simply cannot go back to bed and stays up until next morning. Lays there thinking A/P: discussed possibly using pause app and a few bible versis that may help her- faith is important to her. Also discussed no screens at least 30 minutes before bed. Could try pause app as well  Recommended follow up: Return in about 6 months (around 03/04/2022) for follow up- or sooner if needed.  Lab/Order associations: fasting   ICD-10-CM   1. Preventative health care  Z00.00     2. Chronic obstructive pulmonary disease, unspecified COPD type ([place]  J44.9     3. Aortic atherosclerosis (HCC)  I70.0     4. Hyperlipidemia, unspecified hyperlipidemia type  E78.5     5. Age related osteoporosis, unspecified pathological fracture presence  M81.0     6. Former smoker  Z87.891       No orders of the defined types were placed in this encounter.  I,Harris Phan,acting as a sEducation administratorfor SGarret Reddish MD.,have documented all relevant  documentation on the behalf of SGarret Reddish MD,as directed by  SGarret Reddish MD while in the presence of SGarret Reddish MD.   I, SGarret Reddish MD, have reviewed all documentation for this visit. The documentation on 09/04/21 for the exam, diagnosis, procedures, and orders are all accurate and complete.    Return precautions advised.  SGarret Reddish MD

## 2021-09-04 NOTE — Patient Instructions (Addendum)
Health Maintenance Due  Topic Date Due   Zoster Vaccines- Shingrix (1 of 2) -Please check with your pharmacy to see if they have the shingrix vaccine. If they do- please get this immunization and update [place] by phone call or mychart with dates you receive the vaccine.  Never done   COVID-19 Vaccine (4 - Booster for Moderna series) - Please provide [place] with the dates of your last COVID vaccinations via message on MyChart.  03/05/2021   Please stop by lab before you go If you have mychart- we will send your results within 3 business days of [place] receiving them.  If you do not have mychart- we will call you about results within 5 business days of [place] receiving them.  *please also note that you will see labs on mychart as soon as they post. I will later go in and write notes on them- will say "notes from Dr. Yong Channel"  [facility] Schedule an appointment by calling [phone] for a Bone Density and Mammogram. Continue to finish up your last Fosamax up until this last week at January.  Psalms 4:8 and Proverbs 3:24 . Consider Pause app or pray as you go before bed.   Encourage cutting back on screen time AT LEAST 30 minutes to an hour before going to bed.   Encourage getting daily exercise in to you lifestyle - try getting at least 150 minutes of regular exercise per week. Weight bearing like walking preferred for bone density  Recommended follow up: Return in about 6 months (around 03/04/2022) for follow up- or sooner if needed.

## 2021-09-05 ENCOUNTER — Other Ambulatory Visit: Payer: Self-pay

## 2021-09-05 ENCOUNTER — Encounter: Payer: Self-pay | Admitting: Family Medicine

## 2021-09-05 DIAGNOSIS — E875 Hyperkalemia: Secondary | ICD-10-CM

## 2021-09-05 MED ORDER — ATORVASTATIN CALCIUM 80 MG PO TABS: 80.0000 mg | ORAL_TABLET | Freq: Every day | ORAL | 3 refills | Status: DC

## 2021-09-06 ENCOUNTER — Other Ambulatory Visit (INDEPENDENT_AMBULATORY_CARE_PROVIDER_SITE_OTHER): Payer: Medicare PPO

## 2021-09-06 ENCOUNTER — Other Ambulatory Visit: Payer: Self-pay

## 2021-09-06 ENCOUNTER — Encounter: Payer: Self-pay | Admitting: Family Medicine

## 2021-09-06 DIAGNOSIS — E875 Hyperkalemia: Secondary | ICD-10-CM

## 2021-09-06 LAB — COMPREHENSIVE METABOLIC PANEL
ALT: 8 U/L (ref 0–35)
AST: 19 U/L (ref 0–37)
Albumin: 4.5 g/dL (ref 3.5–5.2)
Alkaline Phosphatase: 77 U/L (ref 39–117)
BUN: 11 mg/dL (ref 6–23)
CO2: 30 mEq/L (ref 19–32)
Calcium: 9.9 mg/dL (ref 8.4–10.5)
Chloride: 104 mEq/L (ref 96–112)
Creatinine, Ser: 0.81 mg/dL (ref 0.40–1.20)
GFR: 72.73 mL/min (ref >60.00)
Glucose, Bld: 92 mg/dL (ref 70–99)
Potassium: 4.5 mEq/L (ref 3.5–5.1)
Sodium: 141 mEq/L (ref 135–145)
Total Bilirubin: 0.7 mg/dL (ref 0.2–1.2)
Total Protein: 7.4 g/dL (ref 6.0–8.3)

## 2021-09-12 ENCOUNTER — Other Ambulatory Visit: Payer: Self-pay

## 2021-09-12 ENCOUNTER — Ambulatory Visit (HOSPITAL_COMMUNITY)
Admission: RE | Admit: 2021-09-12 | Discharge: 2021-09-12 | Disposition: A | Discharge status: Home / Self Care | Payer: Medicare PPO | Source: Ambulatory Visit | Attending: Family Medicine | Admitting: Family Medicine

## 2021-09-12 DIAGNOSIS — Z1231 Encounter for screening mammogram for malignant neoplasm of breast: Secondary | ICD-10-CM | POA: Diagnosis not present

## 2021-10-23 ENCOUNTER — Ambulatory Visit (INDEPENDENT_AMBULATORY_CARE_PROVIDER_SITE_OTHER): Payer: Medicare PPO

## 2021-10-23 ENCOUNTER — Other Ambulatory Visit: Payer: Self-pay

## 2021-10-23 DIAGNOSIS — Z0000 Encounter for general adult medical examination without abnormal findings: Secondary | ICD-10-CM | POA: Diagnosis not present

## 2021-10-23 NOTE — Progress Notes (Signed)
Virtual Visit via Telephone Note  I connected with  [name] on 10/23/21 at 11:45 AM EST by telephone and verified that I am speaking with the correct person using two identifiers.  Medicare Annual Wellness visit completed telephonically due to Covid-19 pandemic.   Persons participating in this call: This Health Coach and this patient.   Location: Patient: Home Provider: Office   I discussed the limitations, risks, security and privacy concerns of performing an evaluation and management service by telephone and the availability of in person appointments. The patient expressed understanding and agreed to proceed.  Unable to perform video visit due to video visit attempted and failed and/or patient does not have video capability.   Some vital signs may be absent or patient reported.   Willette Brace, LPN   Subjective:   [name] is a 71 y.o. female who presents for an Initial Medicare Annual Wellness Visit.  Review of Systems     Cardiac Risk Factors include: advanced age (>26men, >40 women);dyslipidemia;hypertension     Objective:    There were no vitals filed for this visit. There is no height or weight on file to calculate BMI.  Advanced Directives 10/23/2021  Does Patient Have a Medical Advance Directive? Yes  Does patient want to make changes to medical advance directive? Yes (MAU/Ambulatory/Procedural Areas - Information given)    Current Medications (verified) Outpatient Encounter Medications as of 10/23/2021  Medication Sig   acetaminophen (TYLENOL) 500 MG tablet Take 500 mg by mouth every 6 (six) hours as needed.   albuterol (PROAIR HFA) 108 (90 Base) MCG/ACT inhaler Inhale 1-2 puffs into the lungs every 6 (six) hours as needed for wheezing or shortness of breath. Reported on 02/21/2016   alendronate (FOSAMAX) 70 MG tablet TAKE 1 TABLET BY MOUTH EVERY 7 DAYS ON AN EMPTY STOMACH AND WITH A FULL GLASS OF WATER   atorvastatin (LIPITOR) 80 MG tablet Take 1  tablet (80 mg total) by mouth daily.   Calcium-Magnesium-Vitamin D 600-40-500 MG-MG-UNIT TB24 Take by mouth.   meloxicam (MOBIC) 15 MG tablet Take 1 tablet (15 mg total) by mouth daily as needed.   Tiotropium Bromide-Olodaterol (STIOLTO RESPIMAT) 2.5-2.5 MCG/ACT AERS Inhale 2 puffs into the lungs daily. Pt needs appt for further refills.   No facility-administered encounter medications on file as of 10/23/2021.    Allergies (verified) Codeine and Penicillins   History: Past Medical History:  Diagnosis Date   Complication of anesthesia    per pt, "hard to wake up past sedation".   COPD (chronic obstructive pulmonary disease) (HCC)    Depression    previous rx, not in years   Hyperlipidemia    Lung cancer ([place]) 2010   Left upper lung- lobectomy   Osteoporosis    Past Surgical History:  Procedure Laterality Date   CHOLECYSTECTOMY     COLONOSCOPY  2019   [facility]   LUNG CANCER SURGERY  2010   Left upper   POLYPECTOMY     VAGINAL HYSTERECTOMY     including ovaries and fallopian tubes   Family History  Problem Relation Age of Onset   Epilepsy Mother        hurt at birth, birth trauma reltaed   Hypertension Father    CVA Father        hemorrhagic   Other Brother        21 possible heart attack   Lung cancer Brother    Breast cancer Neg Hx    Colon  cancer Neg Hx    Esophageal cancer Neg Hx    Stomach cancer Neg Hx    Social History   Socioeconomic History   Marital status: Married    Spouse name: Not on file   Number of children: Not on file   Years of education: Not on file   Highest education level: Not on file  Occupational History   Not on file  Tobacco Use   Smoking status: Former    Packs/day: 1.00    Years: 40.00    Pack years: 40.00    Types: Cigarettes    Quit date: 12/17/2008    Years since quitting: 12.8   Smokeless tobacco: Never  Vaping Use   Vaping Use: Never used  Substance and Sexual Activity   Alcohol use: No   Drug use: No   Sexual  activity: Not on file  Other Topics Concern   Not on file  Social History Narrative   Married (husband Truman Hayward also Dr. Yong Channel patient) 1987. 3 children (2 living, son died 12-16-19 unfortunately). 3 grandchildren.       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       Hobbies: shopping (husband will go with her) for grandkids.    Social Determinants of Health   Financial Resource Strain: Low Risk    Difficulty of Paying Living Expenses: Not hard at all  Food Insecurity: No Food Insecurity   Worried About Charity fundraiser in the Last Year: Never true   [place] in the Last Year: Never true  Transportation Needs: No Transportation Needs   Lack of Transportation (Medical): No   Lack of Transportation (Non-Medical): No  Physical Activity: Insufficiently Active   Days of Exercise per Week: 3 days   Minutes of Exercise per Session: 30 min  Stress: No Stress Concern Present   Feeling of Stress : Not at all  Social Connections: Moderately Integrated   Frequency of Communication with Friends and Family: More than three times a week   Frequency of Social Gatherings with Friends and Family: More than three times a week   Attends Religious Services: More than 4 times per year   Active Member of [employer] or Organizations: No   Attends Music therapist: Never   Marital Status: Married    Tobacco Counseling Counseling given: Not Answered   Clinical Intake:  Pre-visit preparation completed: Yes  Pain : No/denies pain     BMI - recorded: 24.52 Nutritional Status: BMI of 19-24  Normal Nutritional Risks: None Diabetes: No  How often do you need to have someone help you when you read instructions, pamphlets, or other written materials from your doctor or pharmacy?: 1 - Never  Diabetic?No  Interpreter Needed?: No  Information entered by :: Charlott Rakes, LPN   Activities of Daily Living In your present state of health,  do you have any difficulty performing the following activities: 10/23/2021 02/26/2021  Hearing? N N  Vision? N N  Difficulty concentrating or making decisions? N N  Walking or climbing stairs? N N  Dressing or bathing? N N  Doing errands, shopping? N N  Preparing Food and eating ? N -  Using the Toilet? N -  In the past six months, have you accidently leaked urine? N -  Do you have problems with loss of bowel control? N -  Managing your Medications? N -  Managing your Finances? N -  Housekeeping  or managing your Housekeeping? N -  Some recent data might be hidden    Patient Care Team: Marin Olp, MD as PCP - General (Family Medicine)  Indicate any recent Medical Services you may have received from other than [facility] providers in the past year (date may be approximate).     Assessment:   This is a routine wellness examination for Andrianna.  Hearing/Vision screen Hearing Screening - Comments:: Pt denies any hearing issues  Vision Screening - Comments:: Pt encouraged to follow up with Provider   Dietary issues and exercise activities discussed: Current Exercise Habits: Home exercise routine, Type of exercise: walking, Time (Minutes): 30, Frequency (Times/Week): 3, Weekly Exercise (Minutes/Week): 90   Goals Addressed             This Visit's Progress    Patient Stated       None at this time       Depression Screen PHQ 2/9 Scores 10/23/2021 09/04/2021 02/26/2021 08/31/2020 02/29/2020 10/26/2018 02/04/2018  PHQ - 2 Score 0 2 0 0 3 0 0  PHQ- 9 Score - 4 - - 5 - -    Fall Risk Fall Risk  10/23/2021 02/26/2021 08/31/2020 02/29/2020 02/04/2018  Falls in the past year? 0 0 0 0 No  Number falls in past yr: 0 0 0 0 -  Injury with Fall? 0 0 0 0 -  Risk for fall due to : - No Fall Risks - - -  Follow up Falls prevention discussed Falls evaluation completed - - -    FALL RISK PREVENTION PERTAINING TO THE HOME:  Any stairs in or around the home? Yes  If so, are there any without  handrails? No  Home free of loose throw rugs in walkways, pet beds, electrical cords, etc? Yes  Adequate lighting in your home to reduce risk of falls? Yes   ASSISTIVE DEVICES UTILIZED TO PREVENT FALLS:  Life alert? No  Use of a cane, walker or w/c? No  Grab bars in the bathroom? No  Shower chair or bench in shower? No  Elevated toilet seat or a handicapped toilet? No   TIMED UP AND GO:  Was the test performed? No .   Cognitive Function:     6CIT Screen 10/23/2021  What Year? 0 points  What month? 0 points  What time? 0 points  Count back from 20 0 points  Months in reverse 0 points  Repeat phrase 0 points  Total Score 0    Immunizations Immunization History  Administered Date(s) Administered   Fluad Quad(high Dose 65+) 07/04/2020   Influenza Split 05/20/2012   Influenza Whole 06/06/2009, 04/15/2012   Influenza, High Dose Seasonal PF 05/26/2018, 04/19/2019, 04/19/2019, 07/02/2021   Influenza-Unspecified 06/15/2013, 08/09/2014, 07/21/2015, 07/16/2016, 07/14/2017, 05/26/2018   Moderna SARS-COV2 Booster Vaccination 08/24/2020, 01/08/2021   Moderna Sars-Covid-2 Vaccination 11/10/2019, 12/08/2019, 08/24/2020   PNEUMOCOCCAL CONJUGATE-20 10/31/2020   Pneumococcal Conjugate-13 09/22/2014   Pneumococcal Polysaccharide-23 02/21/2016   Tdap 09/05/2014    TDAP status: Up to date  Flu Vaccine status: Up to date  Pneumococcal vaccine status: Up to date  Covid-19 vaccine status: Completed vaccines  Qualifies for Shingles Vaccine? Yes   Zostavax completed No   Shingrix Completed?: No.    Education has been provided regarding the importance of this vaccine. Patient has been advised to call insurance company to determine out of pocket expense if they have not yet received this vaccine. Advised may also receive vaccine at local pharmacy or Health Dept. Verbalized  acceptance and understanding.  Screening Tests Health Maintenance  Topic Date Due   Zoster Vaccines- Shingrix (1 of  2) Never done   COVID-19 Vaccine (4 - Booster for Moderna series) 03/05/2021   MAMMOGRAM  09/13/2023   TETANUS/TDAP  09/05/2024   COLONOSCOPY (Pts 45-66yrs Insurance coverage will need to be confirmed)  07/18/2031   Pneumonia Vaccine 58+ Years old  Completed   INFLUENZA VACCINE  Completed   DEXA SCAN  Completed   Hepatitis C Screening  Completed   HPV VACCINES  Aged Out    Health Maintenance  Health Maintenance Due  Topic Date Due   Zoster Vaccines- Shingrix (1 of 2) Never done   COVID-19 Vaccine (4 - Booster for Moderna series) 03/05/2021    Colorectal cancer screening: Type of screening: Colonoscopy. Completed 07/17/21. Repeat every 10 years  Mammogram status: Completed 09/12/21. Repeat every year  Bone Density status: Completed 09/07/20. Results reflect: Bone density results: OSTEOPOROSIS. Repeat every 2 years.    Additional Screening:  Hepatitis C Screening:  Completed 09/18/15  Vision Screening: Recommended annual ophthalmology exams for early detection of glaucoma and other disorders of the eye. Is the patient up to date with their annual eye exam?  No  Who is the provider or what is the name of the office in which the patient attends annual eye exams? Encouraged to find provider  If pt is not established with a provider, would they like to be referred to a provider to establish care? No .   Dental Screening: Recommended annual dental exams for proper oral hygiene  Community Resource Referral / Chronic Care Management: CRR required this visit?  No   CCM required this visit?  No      Plan:     I have personally reviewed and noted the following in the patients chart:   Medical and social history Use of alcohol, tobacco or illicit drugs  Current medications and supplements including opioid prescriptions. Patient is not currently taking opioid prescriptions. Functional ability and status Nutritional status Physical activity Advanced directives List of other  physicians Hospitalizations, surgeries, and ER visits in previous 12 months Vitals Screenings to include cognitive, depression, and falls Referrals and appointments  In addition, I have reviewed and discussed with patient certain preventive protocols, quality metrics, and best practice recommendations. A written personalized care plan for preventive services as well as general preventive health recommendations were provided to patient.     Willette Brace, LPN   1/[id number]/6269   Nurse Notes: None

## 2021-10-23 NOTE — Patient Instructions (Signed)
[name] , Thank you for taking time to come for your Medicare Wellness Visit. I appreciate your ongoing commitment to your health goals. Please review the following plan we discussed and let me know if I can assist you in the future.   Screening recommendations/referrals: Colonoscopy: Done 07/17/21 repeat every 10 years  Mammogram: Done 09/12/21 repeat every year  Bone Density: Done 09/07/20 repeat every  Recommended yearly ophthalmology/optometry visit for glaucoma screening and checkup Recommended yearly dental visit for hygiene and checkup  Vaccinations: Influenza vaccine: Done 07/02/21 repeat every year  Pneumococcal vaccine: Up to date Tdap vaccine: Completed 09/05/14 repeat every 10 years  Shingles vaccine: Shingrix discussed. Please contact your pharmacy for coverage information.    Covid-19:Completed 3/24, 12/08/19, 08/24/20, 01/08/21  Advanced directives: Advance directive discussed with you today. I have provided a copy for you to complete at home and have notarized. Once this is complete please bring a copy in to our office so we can scan it into your chart.  Conditions/risks identified: non at this time  Next appointment: Follow up in one year for your annual wellness visit    Preventive Care 65 Years and Older, Female Preventive care refers to lifestyle choices and visits with your health care provider that can promote health and wellness. What does preventive care include? A yearly physical exam. This is also called an annual well check. Dental exams once or twice a year. Routine eye exams. Ask your health care provider how often you should have your eyes checked. Personal lifestyle choices, including: Daily care of your teeth and gums. Regular physical activity. Eating a healthy diet. Avoiding tobacco and drug use. Limiting alcohol use. Practicing safe sex. Taking low-dose aspirin every day. Taking vitamin and mineral supplements as recommended by your health care  provider. What happens during an annual well check? The services and screenings done by your health care provider during your annual well check will depend on your age, overall health, lifestyle risk factors, and family history of disease. Counseling  Your health care provider may ask you questions about your: Alcohol use. Tobacco use. Drug use. Emotional well-being. Home and relationship well-being. Sexual activity. Eating habits. History of falls. Memory and ability to understand (cognition). Work and work Statistician. Reproductive health. Screening  You may have the following tests or measurements: Height, weight, and BMI. Blood pressure. Lipid and cholesterol levels. These may be checked every 5 years, or more frequently if you are over 15 years old. Skin check. Lung cancer screening. You may have this screening every year starting at age 50 if you have a 30-pack-year history of smoking and currently smoke or have quit within the past 15 years. Fecal occult blood test (FOBT) of the stool. You may have this test every year starting at age 67. Flexible sigmoidoscopy or colonoscopy. You may have a sigmoidoscopy every 5 years or a colonoscopy every 10 years starting at age 72. Hepatitis C blood test. Hepatitis B blood test. Sexually transmitted disease (STD) testing. Diabetes screening. This is done by checking your blood sugar (glucose) after you have not eaten for a while (fasting). You may have this done every 1-3 years. Bone density scan. This is done to screen for osteoporosis. You may have this done starting at age 78. Mammogram. This may be done every 1-2 years. Talk to your health care provider about how often you should have regular mammograms. Talk with your health care provider about your test results, treatment options, and if necessary, the need  for more tests. Vaccines  Your health care provider may recommend certain vaccines, such as: Influenza vaccine. This is  recommended every year. Tetanus, diphtheria, and acellular pertussis (Tdap, Td) vaccine. You may need a Td booster every 10 years. Zoster vaccine. You may need this after age 76. Pneumococcal 13-valent conjugate (PCV13) vaccine. One dose is recommended after age 67. Pneumococcal polysaccharide (PPSV23) vaccine. One dose is recommended after age 24. Talk to your health care provider about which screenings and vaccines you need and how often you need them. This information is not intended to replace advice given to you by your health care provider. Make sure you discuss any questions you have with your health care provider. Document Released: 09/01/2015 Document Revised: 04/24/2016 Document Reviewed: 06/06/2015 Elsevier Interactive Patient Education  2017 [place] Prevention in the Home Falls can cause injuries. They can happen to people of all ages. There are many things you can do to make your home safe and to help prevent falls. What can I do on the outside of my home? Regularly fix the edges of walkways and driveways and fix any cracks. Remove anything that might make you trip as you walk through a door, such as a raised step or threshold. Trim any bushes or trees on the path to your home. Use bright outdoor lighting. Clear any walking paths of anything that might make someone trip, such as rocks or tools. Regularly check to see if handrails are loose or broken. Make sure that both sides of any steps have handrails. Any raised decks and porches should have guardrails on the edges. Have any leaves, snow, or ice cleared regularly. Use sand or salt on walking paths during winter. Clean up any spills in your garage right away. This includes oil or grease spills. What can I do in the bathroom? Use night lights. Install grab bars by the toilet and in the tub and shower. Do not use towel bars as grab bars. Use non-skid mats or decals in the tub or shower. If you need to sit down in  the shower, use a plastic, non-slip stool. Keep the floor dry. Clean up any water that spills on the floor as soon as it happens. Remove soap buildup in the tub or shower regularly. Attach bath mats securely with double-sided non-slip rug tape. Do not have throw rugs and other things on the floor that can make you trip. What can I do in the bedroom? Use night lights. Make sure that you have a light by your bed that is easy to reach. Do not use any sheets or blankets that are too big for your bed. They should not hang down onto the floor. Have a firm chair that has side arms. You can use this for support while you get dressed. Do not have throw rugs and other things on the floor that can make you trip. What can I do in the kitchen? Clean up any spills right away. Avoid walking on wet floors. Keep items that you use a lot in easy-to-reach places. If you need to reach something above you, use a strong step stool that has a grab bar. Keep electrical cords out of the way. Do not use floor polish or wax that makes floors slippery. If you must use wax, use non-skid floor wax. Do not have throw rugs and other things on the floor that can make you trip. What can I do with my stairs? Do not leave any items on the stairs.  Make sure that there are handrails on both sides of the stairs and use them. Fix handrails that are broken or loose. Make sure that handrails are as long as the stairways. Check any carpeting to make sure that it is firmly attached to the stairs. Fix any carpet that is loose or worn. Avoid having throw rugs at the top or bottom of the stairs. If you do have throw rugs, attach them to the floor with carpet tape. Make sure that you have a light switch at the top of the stairs and the bottom of the stairs. If you do not have them, ask someone to add them for you. What else can I do to help prevent falls? Wear shoes that: Do not have high heels. Have rubber bottoms. Are comfortable  and fit you well. Are closed at the toe. Do not wear sandals. If you use a stepladder: Make sure that it is fully opened. Do not climb a closed stepladder. Make sure that both sides of the stepladder are locked into place. Ask someone to hold it for you, if possible. Clearly mark and make sure that you can see: Any grab bars or handrails. First and last steps. Where the edge of each step is. Use tools that help you move around (mobility aids) if they are needed. These include: Canes. Walkers. Scooters. Crutches. Turn on the lights when you go into a dark area. Replace any light bulbs as soon as they burn out. Set up your furniture so you have a clear path. Avoid moving your furniture around. If any of your floors are uneven, fix them. If there are any pets around you, be aware of where they are. Review your medicines with your doctor. Some medicines can make you feel dizzy. This can increase your chance of falling. Ask your doctor what other things that you can do to help prevent falls. This information is not intended to replace advice given to you by your health care provider. Make sure you discuss any questions you have with your health care provider. Document Released: 06/01/2009 Document Revised: 01/11/2016 Document Reviewed: 09/09/2014 Elsevier Interactive Patient Education  2017 [employer].

## 2021-12-12 ENCOUNTER — Other Ambulatory Visit: Payer: Self-pay | Admitting: Internal Medicine

## 2021-12-13 ENCOUNTER — Other Ambulatory Visit: Payer: Self-pay | Admitting: Internal Medicine

## 2022-01-19 ENCOUNTER — Emergency Department (HOSPITAL_COMMUNITY)
Admission: EM | Admit: 2022-01-19 | Discharge: 2022-01-19 | Disposition: A | Discharge status: Home / Self Care | Payer: Medicare PPO | Attending: Emergency Medicine | Admitting: Emergency Medicine

## 2022-01-19 ENCOUNTER — Other Ambulatory Visit: Payer: Self-pay

## 2022-01-19 ENCOUNTER — Encounter (HOSPITAL_COMMUNITY): Payer: Self-pay

## 2022-01-19 DIAGNOSIS — S61452A Open bite of left hand, initial encounter: Secondary | ICD-10-CM | POA: Diagnosis not present

## 2022-01-19 DIAGNOSIS — S60512A Abrasion of left hand, initial encounter: Secondary | ICD-10-CM | POA: Insufficient documentation

## 2022-01-19 DIAGNOSIS — W5501XA Bitten by cat, initial encounter: Secondary | ICD-10-CM | POA: Diagnosis not present

## 2022-01-19 DIAGNOSIS — S6992XA Unspecified injury of left wrist, hand and finger(s), initial encounter: Secondary | ICD-10-CM | POA: Diagnosis present

## 2022-01-19 DIAGNOSIS — J449 Chronic obstructive pulmonary disease, unspecified: Secondary | ICD-10-CM | POA: Diagnosis not present

## 2022-01-19 DIAGNOSIS — Z23 Encounter for immunization: Secondary | ICD-10-CM | POA: Diagnosis not present

## 2022-01-19 MED ORDER — DOXYCYCLINE HYCLATE 100 MG PO TABS
100.0000 mg | ORAL_TABLET | Freq: Once | ORAL | Status: AC
Start: 2022-01-19 — End: 2022-01-19
  Administered 2022-01-19: 100 mg via ORAL
  Filled 2022-01-19: qty 1

## 2022-01-19 MED ORDER — TETANUS-DIPHTH-ACELL PERTUSSIS 5-2.5-18.5 LF-MCG/0.5 IM SUSY
0.5000 mL | PREFILLED_SYRINGE | Freq: Once | INTRAMUSCULAR | Status: AC
  Administered 2022-01-19: 0.5 mL via INTRAMUSCULAR
  Filled 2022-01-19: qty 0.5

## 2022-01-19 MED ORDER — DOXYCYCLINE HYCLATE 100 MG PO CAPS: 100.0000 mg | ORAL_CAPSULE | Freq: Two times a day (BID) | ORAL | 0 refills | Status: AC

## 2022-01-19 NOTE — ED Provider Notes (Signed)
[facility] Provider Note   CSN: [id number] Arrival date & time: 01/19/22  1700     History  Chief Complaint  Patient presents with   Animal Bite    [name] is a 71 y.o. female.  HPI  Patient with medical history including COPD, depression presents with complaints of a cat bite.  Patient states that she was playing with her cat 2 days ago and the cat accidentally bit her, states she was bit on her left hand,  has an abrasion on the anterior aspect of her proximal aspect of her first medical carpal.  States that over the last couple days it is come more red and swollen, she denies any discharge or drainage, denies any difficulty with thumb movements, she is not immunocompromise, up-to-date on all childhood vaccines, the cat is up-to-date on all tetanus shots.  This is a household cat, patient was not scratch.  Home Medications Prior to Admission medications   Medication Sig Start Date End Date Taking? Authorizing Provider  doxycycline (VIBRAMYCIN) 100 MG capsule Take 1 capsule (100 mg total) by mouth 2 (two) times daily for 7 days. 01/19/22 01/26/22 Yes Marcello Fennel, PA-C  acetaminophen (TYLENOL) 500 MG tablet Take 500 mg by mouth every 6 (six) hours as needed.    [provider]  albuterol (PROAIR HFA) 108 (90 Base) MCG/ACT inhaler Inhale 1-2 puffs into the lungs every 6 (six) hours as needed for wheezing or shortness of breath. Reported on 02/21/2016 07/04/20   Brand Males, MD  alendronate (FOSAMAX) 70 MG tablet TAKE 1 TABLET BY MOUTH EVERY 7 DAYS ON AN EMPTY STOMACH AND WITH A FULL GLASS OF WATER 05/08/21   Marin Olp, MD  atorvastatin (LIPITOR) 80 MG tablet Take 1 tablet (80 mg total) by mouth daily. 09/05/21   Marin Olp, MD  Calcium-Magnesium-Vitamin D [phone] MG-MG-UNIT TB24 Take by mouth.    [provider]  meloxicam (MOBIC) 15 MG tablet Take 1 tablet (15 mg total) by mouth daily as needed. 02/26/21   Marin Olp, MD  Tiotropium Bromide-Olodaterol (STIOLTO RESPIMAT) 2.5-2.5 MCG/ACT AERS Inhale 2 puffs into the lungs daily. Pt needs appt for further refills. 07/04/20   Brand Males, MD      Allergies    Codeine and Penicillins    Review of Systems   Review of Systems  Constitutional:  Negative for chills and fever.  Respiratory:  Negative for shortness of breath.   Cardiovascular:  Negative for chest pain.  Gastrointestinal:  Negative for abdominal pain.  Skin:  Positive for wound.  Neurological:  Negative for headaches.   Physical Exam Updated Vital Signs BP (!) 160/100   Pulse 86   Temp 97.8 F (36.6 C) (Oral)   Resp 16   Ht 5\' 3"  (1.6 m)   Wt 62.1 kg   SpO2 98%   BMI 24.27 kg/m  Physical Exam Vitals and nursing note reviewed.  Constitutional:      General: She is not in acute distress.    Appearance: She is not ill-appearing.  HENT:     Head: Normocephalic and atraumatic.     Nose: No congestion.  Eyes:     Conjunctiva/sclera: Conjunctivae normal.  Cardiovascular:     Rate and Rhythm: Normal rate and regular rhythm.     Pulses: Normal pulses.  Pulmonary:     Effort: Pulmonary effort is normal.  Skin:    General: Skin is warm and dry.  Comments: Has a small abrasion noted on the proximal aspect of the first metacarpal on the left hand, abrasion is superficial in nature, hemodynamically stable, she has surrounding erythema, no drainage or discharge present, no fluctuant induration noted.  She has full range of motion in all joints of her thumb, neurovascular fully intact, 2+ capillary refill.  Please see picture for full detail.  Neurological:     Mental Status: She is alert.  Psychiatric:        Mood and Affect: Mood normal.     ED Results / Procedures / Treatments   Labs (all labs ordered are listed, but only abnormal results are displayed) Labs Reviewed - No data to display  EKG None  Radiology No results found.  Procedures Procedures     Medications Ordered in ED Medications  doxycycline (VIBRA-TABS) tablet 100 mg (100 mg Oral Given 01/19/22 1826)  Tdap (BOOSTRIX) injection 0.5 mL (0.5 mLs Intramuscular Given 01/19/22 1827)    ED Course/ Medical Decision Making/ A&P                           Medical Decision Making Risk Prescription drug management.   This patient presents to the ED for concern of cat bite, this involves an extensive number of treatment options, and is a complaint that carries with it a high risk of complications and morbidity.  The differential diagnosis includes fracture, dislocation, infection    Additional history obtained:  Additional history obtained from husband at bedside External records from outside source obtained and reviewed including medical history, medications, PCP notes   Co morbidities that complicate the patient evaluation  N/A  Social Determinants of Health:  Geriatric    Lab Tests:  I Ordered, and personally interpreted labs.  The pertinent results include: N/A   Imaging Studies ordered:  I ordered imaging studies including N/A I independently visualized and interpreted imaging which showed N/A I agree with the radiologist interpretation   Cardiac Monitoring:  The patient was maintained on a cardiac monitor.  I personally viewed and interpreted the cardiac monitored which showed an underlying rhythm of: N/A   Medicines ordered and prescription drug management:  I ordered medication including doxycycline I have reviewed the patients home medicines and have made adjustments as needed  Critical Interventions:  N/A   Reevaluation:  Presents with a cat bites, on exam she has erythema around the area, abrasions very superficial in nature, there is no foreign body present.  Patient agreement with plan to discharge at this time.  Consultations Obtained:  N/A    Test Considered:  X-ray of left hand- will defer as my spine suspicion for fracture,  dislocation, foreign body very low at this time, low mechanism of injury, area was nontender my exam, she has full range of motion, no palpable foreign body noted on my exam    Rule out  low suspicion for ligament or tendon damage as area was palpated no gross defects noted, they had full range of motion as well as 5/5 strength.  Low suspicion for compartment syndrome as area was palpated it was soft to the touch, neurovascular fully intact.     Dispostion and problem list  After consideration of the diagnostic results and the patients response to treatment, I feel that the patent would benefit from discharge.  Cellulitis-patient started on antibiotics, up-to-date on her tetanus shot, given strict return precautions can follow-up with PCP as needed.  Final Clinical Impression(s) / ED Diagnoses Final diagnoses:  Cat bite, initial encounter    Rx / DC Orders ED Discharge Orders          Ordered    doxycycline (VIBRAMYCIN) 100 MG capsule  2 times daily        01/19/22 1834              Marcello Fennel, PA-C 01/19/22 Waunita Schooner, MD 01/21/22 1246

## 2022-01-19 NOTE — ED Triage Notes (Signed)
Reports cat bite 2 days ago to L hand. Area red and painful.  Resp even and unlabored.  Skin warm and dry.  nad

## 2022-01-19 NOTE — Discharge Instructions (Signed)
Looks like you have a skin infection, starting you on doxycycline please take as prescribed.  I recommend keeping the area clean, change of the bandages daily.  May also put bacitracin over the area.  If you notice that after 3 days of using antibiotics the swelling and redness has increased,  redness moving up your arm, or your symptoms worsen please come back in for reassessment.  May follow-up your PCP as needed.

## 2022-01-21 ENCOUNTER — Ambulatory Visit
Admission: RE | Admit: 2022-01-21 | Discharge: 2022-01-21 | Disposition: A | Discharge status: Home / Self Care | Payer: Medicare PPO | Source: Ambulatory Visit | Attending: Family Medicine | Admitting: Family Medicine

## 2022-01-21 DIAGNOSIS — M810 Age-related osteoporosis without current pathological fracture: Secondary | ICD-10-CM | POA: Diagnosis not present

## 2022-01-21 DIAGNOSIS — Z780 Asymptomatic menopausal state: Secondary | ICD-10-CM | POA: Diagnosis not present

## 2022-01-22 ENCOUNTER — Encounter: Payer: Self-pay | Admitting: Family Medicine

## 2022-01-23 ENCOUNTER — Telehealth: Payer: Self-pay | Admitting: Internal Medicine

## 2022-01-23 MED ORDER — STIOLTO RESPIMAT 2.5-2.5 MCG/ACT IN AERS: 2.0000 | INHALATION_SPRAY | Freq: Every day | RESPIRATORY_TRACT | 11 refills | Status: DC

## 2022-01-23 NOTE — Telephone Encounter (Signed)
Called patient and let her know refills sent. Nothing further

## 2022-01-24 NOTE — Telephone Encounter (Signed)
See below

## 2022-01-24 NOTE — Telephone Encounter (Signed)
Pt has been scheduled for February 13, 2022 at 10:20 am. Although this is the case, she would like to be seen prior to or on June 14th since she will be leaving for a 2 week trip that day. I see a possible opening on 01/28/22 at 1 pm but this would exceed your limits. Another option would be to see another provider if you and the pt are comfortable. Please Advise.

## 2022-01-24 NOTE — Telephone Encounter (Signed)
FYI-- I was able to schedule pt to be seen on June 12th at 1 pm and explained she would need to arrive 15 minutes early. She thanks you graciously!

## 2022-01-28 ENCOUNTER — Ambulatory Visit (INDEPENDENT_AMBULATORY_CARE_PROVIDER_SITE_OTHER): Payer: Medicare PPO | Admitting: Family Medicine

## 2022-01-28 ENCOUNTER — Encounter: Payer: Self-pay | Admitting: Family Medicine

## 2022-01-28 VITALS — BP 120/78 | HR 81 | Temp 98.1°F | Ht 63.0 in | Wt 138.0 lb

## 2022-01-28 DIAGNOSIS — I700 Atherosclerosis of aorta: Secondary | ICD-10-CM

## 2022-01-28 DIAGNOSIS — I2510 Atherosclerotic heart disease of native coronary artery without angina pectoris: Secondary | ICD-10-CM | POA: Diagnosis not present

## 2022-01-28 DIAGNOSIS — E785 Hyperlipidemia, unspecified: Secondary | ICD-10-CM | POA: Diagnosis not present

## 2022-01-28 DIAGNOSIS — I2584 Coronary atherosclerosis due to calcified coronary lesion: Secondary | ICD-10-CM | POA: Diagnosis not present

## 2022-01-28 DIAGNOSIS — R000 Tachycardia, unspecified: Secondary | ICD-10-CM

## 2022-01-28 DIAGNOSIS — R002 Palpitations: Secondary | ICD-10-CM | POA: Diagnosis not present

## 2022-01-28 LAB — CBC WITH DIFFERENTIAL/PLATELET
Basophils Absolute: 0 10*3/uL (ref 0.0–0.1)
Basophils Relative: 0.5 % (ref 0.0–3.0)
Eosinophils Absolute: 0.1 10*3/uL (ref 0.0–0.7)
Eosinophils Relative: 1.2 % (ref 0.0–5.0)
HCT: 41.5 % (ref 36.0–46.0)
Hemoglobin: 14.2 g/dL (ref 12.0–15.0)
Lymphocytes Relative: 26 % (ref 12.0–46.0)
Lymphs Abs: 1.8 10*3/uL (ref 0.7–4.0)
MCHC: 34.2 g/dL (ref 30.0–36.0)
MCV: 83.4 fl (ref 78.0–100.0)
Monocytes Absolute: 0.5 10*3/uL (ref 0.1–1.0)
Monocytes Relative: 6.6 % (ref 3.0–12.0)
Neutro Abs: 4.5 10*3/uL (ref 1.4–7.7)
Neutrophils Relative %: 65.7 % (ref 43.0–77.0)
Platelets: 284 10*3/uL (ref 150.0–400.0)
RBC: 4.98 Mil/uL (ref 3.87–5.11)
RDW: 13.5 % (ref 11.5–15.5)
WBC: 6.9 10*3/uL (ref 4.0–10.5)

## 2022-01-28 LAB — COMPREHENSIVE METABOLIC PANEL
ALT: 8 U/L (ref 0–35)
AST: 19 U/L (ref 0–37)
Albumin: 4.2 g/dL (ref 3.5–5.2)
Alkaline Phosphatase: 85 U/L (ref 39–117)
BUN: 13 mg/dL (ref 6–23)
CO2: 28 mEq/L (ref 19–32)
Calcium: 9.8 mg/dL (ref 8.4–10.5)
Chloride: 103 mEq/L (ref 96–112)
Creatinine, Ser: 0.8 mg/dL (ref 0.40–1.20)
GFR: 73.62 mL/min (ref >60.00)
Glucose, Bld: 99 mg/dL (ref 70–99)
Potassium: 4.1 mEq/L (ref 3.5–5.1)
Sodium: 140 mEq/L (ref 135–145)
Total Bilirubin: 0.5 mg/dL (ref 0.2–1.2)
Total Protein: 6.9 g/dL (ref 6.0–8.3)

## 2022-01-28 LAB — LDL CHOLESTEROL, DIRECT: Direct LDL: 72 mg/dL

## 2022-01-28 LAB — TSH: TSH: 0.72 u[IU]/mL (ref 0.35–5.50)

## 2022-01-28 MED ORDER — MELOXICAM 15 MG PO TABS: 15.0000 mg | ORAL_TABLET | Freq: Every day | ORAL | 1 refills | Status: DC | PRN

## 2022-01-28 NOTE — Patient Instructions (Addendum)
Lets try to figure out what is going on when heart rate is high  If you have recurrent chest pain or shortness of breath (or seek care immediately) - refer back to cardiology but for now focus on high heart rate  We will call you within two weeks about your referral for 2 week cardiac monitor. If you do not hear within 2 weeks, give [place] a call.    Please stop by lab before you go If you have mychart- we will send your results within 3 business days of [place] receiving them.  If you do not have mychart- we will call you about results within 5 business days of [place] receiving them.  *please also note that you will see labs on mychart as soon as they post. I will later go in and write notes on them- will say "notes from Dr. Yong Channel"   Recommended follow up: we can cancel visit in July if symptoms resolve (or if monitor not done before) and/or we have a plan in place for palpitations. Otherwise we will keep that visit for follow up -she can go ahead and schedule physical 366 days out from last physical as well  Preferred mychart only access of after visit summary

## 2022-01-28 NOTE — Progress Notes (Signed)
Phone [phone] In person visit   Subjective:   [name] is a 71 y.o. year old very pleasant female patient who presents for/with See problem oriented charting Chief Complaint  Patient presents with   Follow-up    Discuss heart rates that started toward the end of April when she experienced some tightness and slight SOB.    Past Medical History-  Patient Active Problem List   Diagnosis Date Noted   Coronary artery calcification 10/31/2012    Priority: High   Aortic atherosclerosis ([place]) 06/05/2020    Priority: Medium    White coat syndrome without diagnosis of hypertension 03/13/2015    Priority: Medium    Osteoporosis 09/05/2014    Priority: Medium    Chronic low back pain 09/05/2014    Priority: Medium    COPD (chronic obstructive pulmonary disease) ([place]) 06/06/2009    Priority: Medium    Encounter for follow-up surveillance of lung cancer 01/05/2009    Priority: Medium    Hyperlipidemia 06/29/2006    Priority: Medium    History of adenomatous polyp of colon 04/08/2018    Priority: Low   Ganglion cyst of finger of right hand 09/23/2016    Priority: Low   Former smoker 03/13/2015    Priority: Low   COPD, frequent exacerbations ([place]) 03/29/2011    Priority: Low   Pulmonary nodule, right 03/29/2011    Priority: Low   Acute non-recurrent sinusitis 02/20/2017    Medications- reviewed and updated Current Outpatient Medications  Medication Sig Dispense Refill   acetaminophen (TYLENOL) 500 MG tablet Take 500 mg by mouth every 6 (six) hours as needed.     albuterol (PROAIR HFA) 108 (90 Base) MCG/ACT inhaler Inhale 1-2 puffs into the lungs every 6 (six) hours as needed for wheezing or shortness of breath. Reported on 02/21/2016 18 g 5   alendronate (FOSAMAX) 70 MG tablet TAKE 1 TABLET BY MOUTH EVERY 7 DAYS ON AN EMPTY STOMACH AND WITH A FULL GLASS OF WATER 8 tablet 1   atorvastatin (LIPITOR) 80 MG tablet Take 1 tablet (80 mg total) by mouth daily. 90 tablet 3    Calcium-Magnesium-Vitamin D 600-40-500 MG-MG-UNIT TB24 Take by mouth.     meloxicam (MOBIC) 15 MG tablet Take 1 tablet (15 mg total) by mouth daily as needed. 90 tablet 1   Tiotropium Bromide-Olodaterol (STIOLTO RESPIMAT) 2.5-2.5 MCG/ACT AERS Inhale 2 puffs into the lungs daily. Pt needs appt for further refills. 4 g 11   No current facility-administered medications for this visit.     Objective:  BP 120/78   Pulse 81   Temp 98.1 F (36.7 C)   Ht 5\' 3"  (1.6 m)   Wt 138 lb (62.6 kg)   SpO2 99%   BMI 24.45 kg/m  Gen: NAD, resting comfortably CV: RRR no murmurs rubs or gallops Lungs: CTAB no crackles, wheeze, rhonchi Ext: no edema Skin: warm, dry, healing wound on left hand  EKG: sinus rhythm with rate 68 and incomplete bundle branch block, normal axis, normal intervals, no hypertrophy, no  st or t wave changes (nothing over 1 box). Similar to EKG for incomplete RBBB 12/30/19     Assessment and Plan    #Chest tightness and shortness of breath S: Patient reports that in late  april  (this was 2 years since her son's death- doesn't feel like it was weighing on her mind) she developed some lower chest tightness and shortness of breath (no wheeze) - at rest. Felt slightly dizzy- could  be anytime.   As of today reports some intermittent pounding palpitations without chest pain or shortness of breath- checks her HR and can get as high as 120 or even 130 and then come down back to 80s or 90s. Happens every 2-3 days - but that has improved.   Leaves Wednesday out of town for 2 weeks- conference in [place] then home for a few days then going to [place].  A/P: Back in April she experienced some chest pain and shortness of breath which was nonexertional and not relieved by rest-does have history of calcifications on coronary arteries as well as aortic atherosclerosis so does have increased cardiac risk-we considered referral to cardiology but all of her symptoms have resolved for at least a  month except for palpitations. Sounds like CP and SOB may have been related to anxiety after loss of son 2 years ago -discussed new or worsening sympotms to seek care  Palpitations started after the original events and we agreed to investigate further with blood work including thyroid testing as well as doing a 2-week event monitor.  Depending on findings may need cardiology visit as well  #Hyperlipidemia/coronary artery calcification/aortic atherosclerosis-has seen Dr. Aundra Dubin in the past S: Compliant with atorvastatin 40 mg--> 80 mg last visit Lab Results  Component Value Date   CHOL 160 02/26/2021   HDL 55.30 02/26/2021   LDLCALC 85 02/26/2021   LDLDIRECT 104.0 09/04/2021   TRIG 98.0 02/26/2021   CHOLHDL 3 02/26/2021  A/P: Cholesterol was elevated above 100 at last visit for LDL and we increased atorvastatin to 80 mg.  She started coenzyme every 10 which has been helpful for some muscle aches.  We are going to check direct LDL today with hopes this at least being under 100. - For aortic atherosclerosis presumed stable-continue to try to control cholesterol   % History of lung cancer with lobectomy in June 2010 still does lung cancer screening- last 06/27/21  # Cat bite and seen in ED 01/19/22. Reports wound improving. Was up to date on tetanus shot. Was given doxycycline by ER- much improved erythema and improving. PCN allergy so augmentin not given  Recommended follow up: we can cancel visit in July if symptoms resolve (or if monitor not done before) and/or we have a plan in place for palpitations. Otherwise we will keep that visit for follow up -she can go ahead and schedule physical 366 days out from last physical as well Future Appointments  Date Time Provider [place]  03/04/2022 10:20 AM Marin Olp, MD [facility]  10/31/2022  2:30 PM [facility] HEALTH COACH [facility]    Lab/Order associations:   ICD-10-CM   1. Palpitations  R00.2 Cardiac event monitor    CBC with  Differential/Platelet    Comprehensive metabolic panel    TSH    2. Increased heart rate  R00.0 EKG 12-Lead    3. Hyperlipidemia, unspecified hyperlipidemia type  E78.5 CBC with Differential/Platelet    Comprehensive metabolic panel    TSH    LDL cholesterol, direct    4. Aortic atherosclerosis (HCC)  I70.0       No orders of the defined types were placed in this encounter.   Return precautions advised.  Garret Reddish, MD

## 2022-02-03 IMAGING — CT CT CHEST LUNG CANCER SCREENING LOW DOSE W/O CM
1 of 2 series · 10 of 20 positions shown, 13 images · non-contrast
Comparison: 04/13/2019

CLINICAL DATA: Lung cancer screening. Forty-one pack-year history.
Former asymptomatic smoker.

EXAM:
CT CHEST WITHOUT CONTRAST LOW-DOSE FOR LUNG CANCER SCREENING
TECHNIQUE: Multidetector CT imaging of the chest was performed following the
standard protocol without IV contrast.

[ct lung segmentation data · axial · 0.57mm/px · z∈[+1131,+1131]mm · 10 of 331 frames shown]
[frame 1/331  mediastinal]
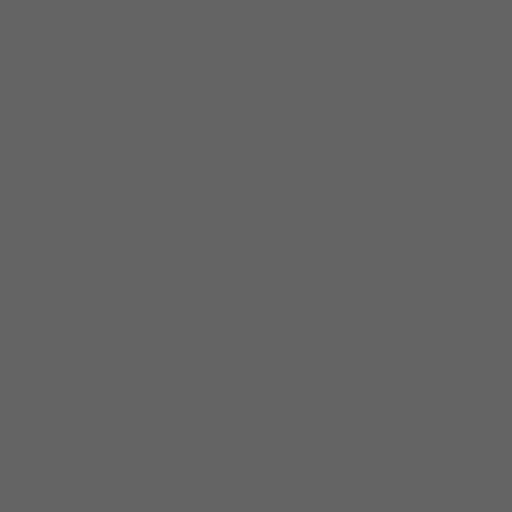
[frame 1/331  lung]
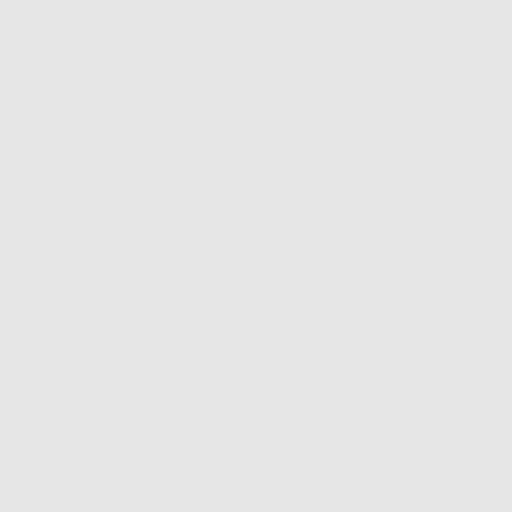
[frame 37/331  lung]
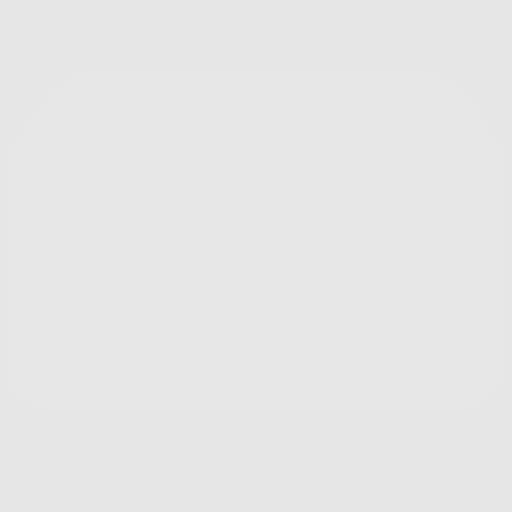
[frame 74/331  lung]
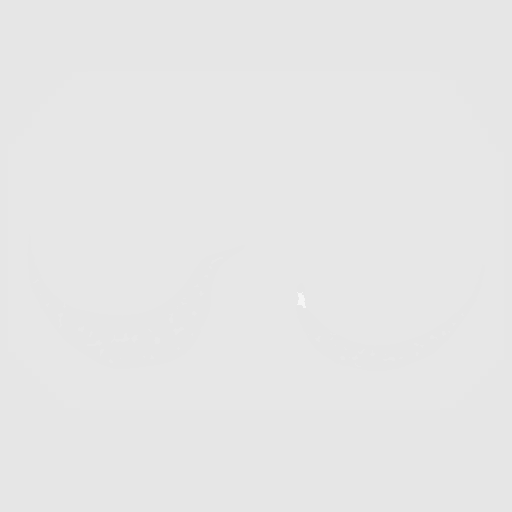
[frame 111/331  lung]
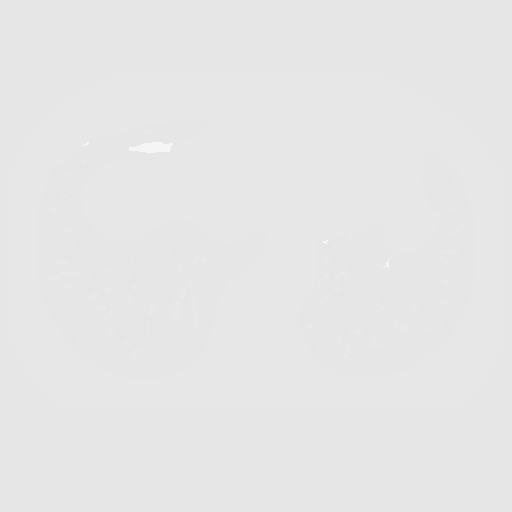
[frame 147/331  mediastinal]
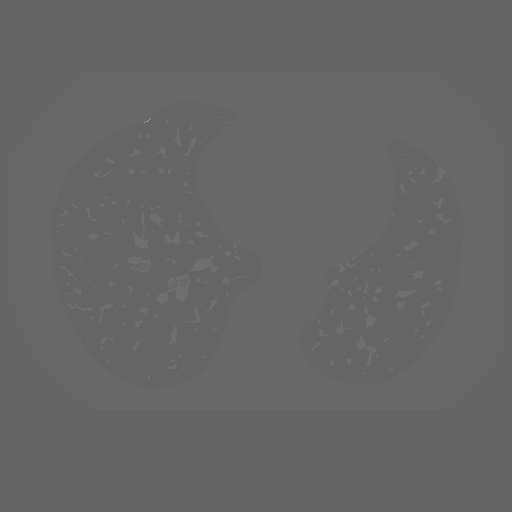
[frame 147/331  lung]
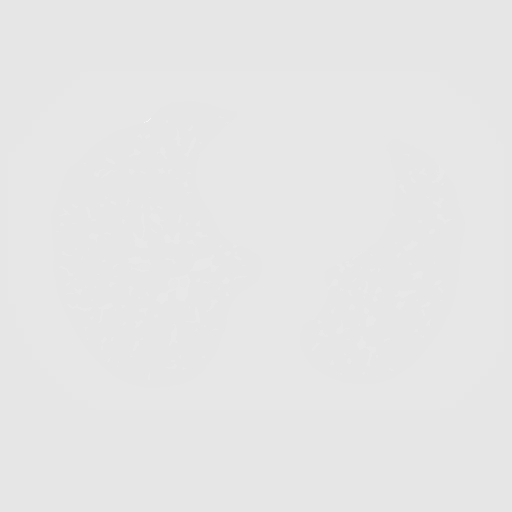
[frame 184/331  lung]
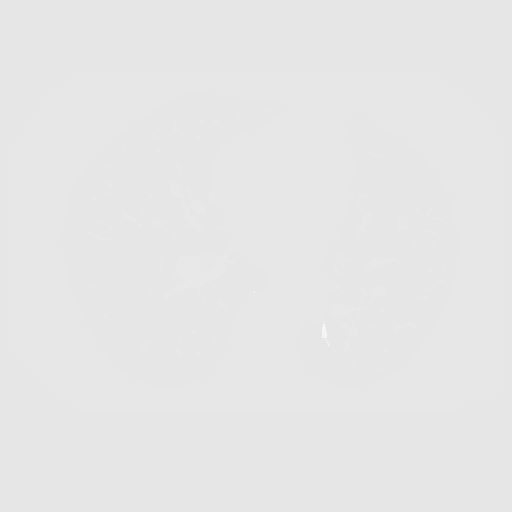
[frame 221/331  lung]
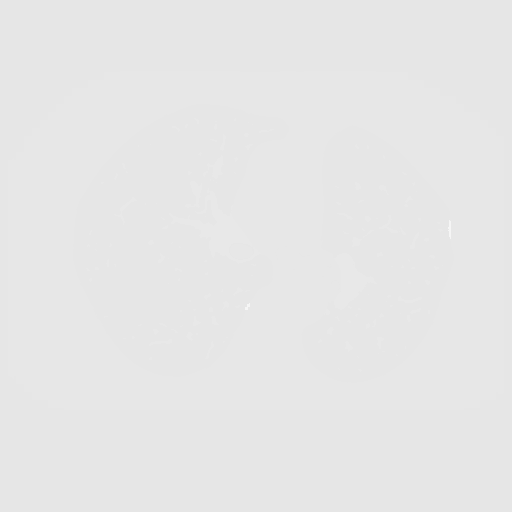
[frame 257/331  lung]
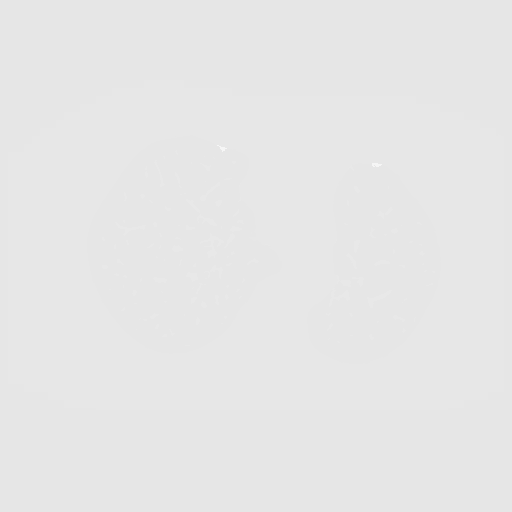
[frame 294/331  mediastinal]
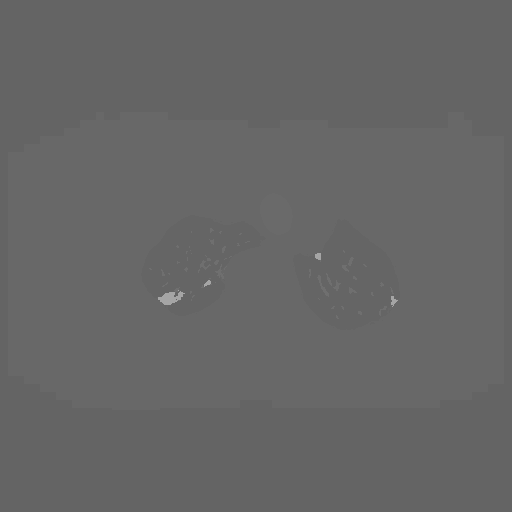
[frame 294/331  lung]
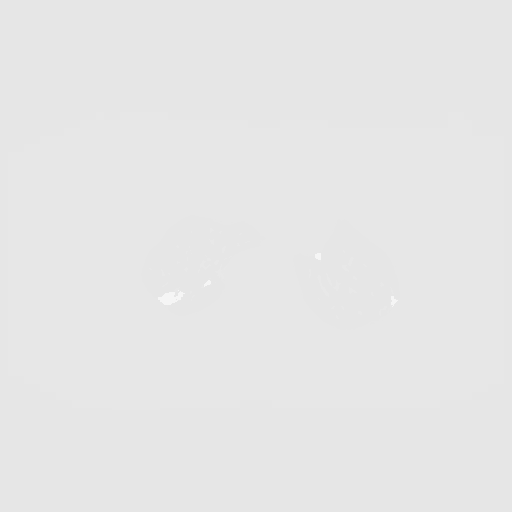
[frame 331/331  lung]
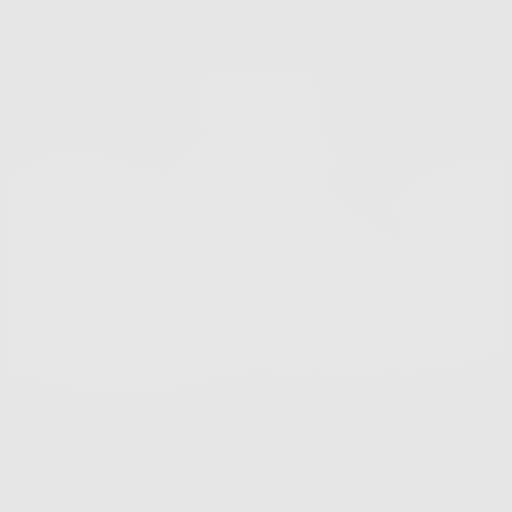

[10 of 20 positions shown; findings below may reference images not displayed]

FINDINGS: Cardiovascular: Heart size appears within normal limits. No
pericardial effusion. Aortic atherosclerosis. Coronary artery
calcifications.

Mediastinum/Nodes: No supraclavicular, axillary, mediastinal or
hilar adenopathy.

Lungs/Pleura: Previous left upper lobectomy. The previous tiny right
apical lung nodule has resolved in the interval. Two non solid
nodules are identified on today's exam which have a an equivalent
diameter of 5.2 mm. These are unchanged from previous exam. There is
a tiny nodule within the lateral right upper lobe which has an
equivalent diameter of 2.4 mm and is unchanged. No new suspicious
lung nodules identified.

Upper Abdomen: No acute abnormality within the imaged portions of
the upper abdomen. Previous cholecystectomy. No biliary ductal
dilatation. Scattered low-attenuation liver foci are again noted and
appear unchanged likely representing multiple small cysts. The
largest of these is in the right hepatic lobe posteriorly measuring
3 cm.

Musculoskeletal: Degenerative disc disease noted within the thoracic
spine. No acute or suspicious osseous abnormality.
IMPRESSION: 1. Lung-RADS 2, benign appearance or behavior. Continue annual
screening with low-dose chest CT without contrast in 12 months.
2. Coronary artery calcifications.
3. Aortic atherosclerosis.

Aortic Atherosclerosis ([email or web address]-OIE.[email or web address]).

## 2022-02-13 ENCOUNTER — Ambulatory Visit: Payer: Medicare PPO | Admitting: Family Medicine

## 2022-02-18 ENCOUNTER — Encounter: Payer: Self-pay | Admitting: Family Medicine

## 2022-02-25 ENCOUNTER — Encounter: Payer: Self-pay | Admitting: Family Medicine

## 2022-02-25 NOTE — Addendum Note (Signed)
Addended by: Marin Olp on: 02/25/2022 02:44 PM   Modules accepted: Orders

## 2022-03-04 ENCOUNTER — Ambulatory Visit: Payer: Medicare PPO | Admitting: Family Medicine

## 2022-03-19 ENCOUNTER — Ambulatory Visit (INDEPENDENT_AMBULATORY_CARE_PROVIDER_SITE_OTHER): Payer: Medicare PPO

## 2022-03-19 DIAGNOSIS — R002 Palpitations: Secondary | ICD-10-CM

## 2022-03-19 NOTE — Progress Notes (Unsigned)
[employer] 14 day cardiac event monitor serial # [id number] from office inventory applied to patient.  DOD to read.

## 2022-05-13 ENCOUNTER — Encounter: Payer: Self-pay | Admitting: *Deleted

## 2022-06-24 DIAGNOSIS — L570 Actinic keratosis: Secondary | ICD-10-CM | POA: Diagnosis not present

## 2022-06-24 DIAGNOSIS — D239 Other benign neoplasm of skin, unspecified: Secondary | ICD-10-CM | POA: Diagnosis not present

## 2022-06-24 DIAGNOSIS — Z85828 Personal history of other malignant neoplasm of skin: Secondary | ICD-10-CM | POA: Diagnosis not present

## 2022-06-24 DIAGNOSIS — Z1283 Encounter for screening for malignant neoplasm of skin: Secondary | ICD-10-CM | POA: Diagnosis not present

## 2022-06-27 ENCOUNTER — Ambulatory Visit (HOSPITAL_COMMUNITY)
Admission: RE | Admit: 2022-06-27 | Discharge: 2022-06-27 | Disposition: A | Discharge status: Home / Self Care | Payer: Medicare PPO | Source: Ambulatory Visit | Attending: Acute Care | Admitting: Acute Care

## 2022-06-27 DIAGNOSIS — Z87891 Personal history of nicotine dependence: Secondary | ICD-10-CM | POA: Diagnosis not present

## 2022-07-03 ENCOUNTER — Encounter: Payer: Self-pay | Admitting: Family Medicine

## 2022-07-09 ENCOUNTER — Other Ambulatory Visit: Payer: Self-pay

## 2022-07-09 ENCOUNTER — Telehealth: Payer: Self-pay | Admitting: Acute Care

## 2022-07-09 DIAGNOSIS — R911 Solitary pulmonary nodule: Secondary | ICD-10-CM

## 2022-07-09 DIAGNOSIS — Z87891 Personal history of nicotine dependence: Secondary | ICD-10-CM

## 2022-07-09 NOTE — Telephone Encounter (Signed)
Spoke with patient (using two pt identifiers) by phone to review results of LDCT.  Radiologist read CT as not suspicious for lung cancer.  Our pulmonary providers, Eric Form, NP and Dr. Valeta Harms also reviewed the CT. Their recommendation is to follow up in 6 months to follow the opacity and multiple small nodules, as a precaution.  If areas are growing, may need biopsy. Patient has had left lobectomy.  Other finding were emphysema and atherosclerosis, as previous. Patient is in agreement to repeat scan in 6 months per provider recommendation.  Order placed and results/plan faxed to PCP.  Patient acknowledged understanding.

## 2022-09-05 ENCOUNTER — Encounter: Payer: Self-pay | Admitting: Family Medicine

## 2022-09-05 ENCOUNTER — Ambulatory Visit (INDEPENDENT_AMBULATORY_CARE_PROVIDER_SITE_OTHER): Payer: Medicare PPO | Admitting: Family Medicine

## 2022-09-05 VITALS — BP 120/84 | HR 75 | Temp 97.2°F | Ht 63.0 in | Wt 137.0 lb

## 2022-09-05 DIAGNOSIS — Z87891 Personal history of nicotine dependence: Secondary | ICD-10-CM | POA: Diagnosis not present

## 2022-09-05 DIAGNOSIS — E785 Hyperlipidemia, unspecified: Secondary | ICD-10-CM

## 2022-09-05 DIAGNOSIS — Z0000 Encounter for general adult medical examination without abnormal findings: Secondary | ICD-10-CM

## 2022-09-05 DIAGNOSIS — J449 Chronic obstructive pulmonary disease, unspecified: Secondary | ICD-10-CM

## 2022-09-05 DIAGNOSIS — I700 Atherosclerosis of aorta: Secondary | ICD-10-CM | POA: Diagnosis not present

## 2022-09-05 DIAGNOSIS — M810 Age-related osteoporosis without current pathological fracture: Secondary | ICD-10-CM

## 2022-09-05 LAB — URINALYSIS, ROUTINE W REFLEX MICROSCOPIC
Bilirubin Urine: NEGATIVE
Hgb urine dipstick: NEGATIVE
Ketones, ur: NEGATIVE
Leukocytes,Ua: NEGATIVE
Nitrite: NEGATIVE
RBC / HPF: NONE SEEN (ref 0–?)
Specific Gravity, Urine: 1.025 (ref 1.000–1.030)
Total Protein, Urine: NEGATIVE
Urine Glucose: NEGATIVE
Urobilinogen, UA: 0.2 (ref 0.0–1.0)
pH: 6 (ref 5.0–8.0)

## 2022-09-05 LAB — COMPREHENSIVE METABOLIC PANEL
ALT: 8 U/L (ref 0–35)
AST: 17 U/L (ref 0–37)
Albumin: 4.4 g/dL (ref 3.5–5.2)
Alkaline Phosphatase: 90 U/L (ref 39–117)
BUN: 18 mg/dL (ref 6–23)
CO2: 27 mEq/L (ref 19–32)
Calcium: 9.6 mg/dL (ref 8.4–10.5)
Chloride: 105 mEq/L (ref 96–112)
Creatinine, Ser: 0.78 mg/dL (ref 0.40–1.20)
GFR: 75.57 mL/min (ref >60.00)
Glucose, Bld: 101 mg/dL — ABNORMAL HIGH (ref 70–99)
Potassium: 4.3 mEq/L (ref 3.5–5.1)
Sodium: 141 mEq/L (ref 135–145)
Total Bilirubin: 0.8 mg/dL (ref 0.2–1.2)
Total Protein: 7.1 g/dL (ref 6.0–8.3)

## 2022-09-05 LAB — CBC WITH DIFFERENTIAL/PLATELET
Basophils Absolute: 0 10*3/uL (ref 0.0–0.1)
Basophils Relative: 0.7 % (ref 0.0–3.0)
Eosinophils Absolute: 0.1 10*3/uL (ref 0.0–0.7)
Eosinophils Relative: 1.2 % (ref 0.0–5.0)
HCT: 42 % (ref 36.0–46.0)
Hemoglobin: 14.6 g/dL (ref 12.0–15.0)
Lymphocytes Relative: 29.4 % (ref 12.0–46.0)
Lymphs Abs: 1.8 10*3/uL (ref 0.7–4.0)
MCHC: 34.7 g/dL (ref 30.0–36.0)
MCV: 82.9 fl (ref 78.0–100.0)
Monocytes Absolute: 0.4 10*3/uL (ref 0.1–1.0)
Monocytes Relative: 6.9 % (ref 3.0–12.0)
Neutro Abs: 3.8 10*3/uL (ref 1.4–7.7)
Neutrophils Relative %: 61.8 % (ref 43.0–77.0)
Platelets: 292 10*3/uL (ref 150.0–400.0)
RBC: 5.06 Mil/uL (ref 3.87–5.11)
RDW: 13.8 % (ref 11.5–15.5)
WBC: 6.2 10*3/uL (ref 4.0–10.5)

## 2022-09-05 LAB — LIPID PANEL
Cholesterol: 164 mg/dL (ref 0–200)
HDL: 61.7 mg/dL (ref >39.00)
LDL Cholesterol: 83 mg/dL (ref 0–99)
NonHDL: 101.91
Total CHOL/HDL Ratio: 3
Triglycerides: 93 mg/dL (ref 0.0–149.0)
VLDL: 18.6 mg/dL (ref 0.0–40.0)

## 2022-09-05 LAB — VITAMIN D 25 HYDROXY (VIT D DEFICIENCY, FRACTURES): VITD: 36.45 ng/mL (ref 30.00–100.00)

## 2022-09-05 MED ORDER — ATORVASTATIN CALCIUM 80 MG PO TABS: 80.0000 mg | ORAL_TABLET | Freq: Every day | ORAL | 3 refills | Status: DC

## 2022-09-05 NOTE — Progress Notes (Signed)
Phone [phone]   Subjective:  Patient presents today for their annual physical. Chief complaint-noted.   See problem oriented charting- ROS- full  review of systems was completed and negative except for: some baseline shortness of breath with copd - has handicap placard but trying not to use it  The following were reviewed and entered/updated in epic: Past Medical History:  Diagnosis Date   Complication of anesthesia    per pt, "hard to wake up past sedation".   COPD (chronic obstructive pulmonary disease) (HCC)    Depression    previous rx, not in years   Hyperlipidemia    Lung cancer (HCC) 2010   Left upper lung- lobectomy   Osteoporosis    Patient Active Problem List   Diagnosis Date Noted   Coronary artery calcification 10/31/2012    Priority: High   Aortic atherosclerosis (HCC) 06/05/2020    Priority: Medium    White coat syndrome without diagnosis of hypertension 03/13/2015    Priority: Medium    Osteoporosis 09/05/2014    Priority: Medium    Chronic low back pain 09/05/2014    Priority: Medium    COPD (chronic obstructive pulmonary disease) (HCC) 06/06/2009    Priority: Medium    Encounter for follow-up surveillance of lung cancer 01/05/2009    Priority: Medium    Hyperlipidemia 06/29/2006    Priority: Medium    History of adenomatous polyp of colon 04/08/2018    Priority: Low   Ganglion cyst of finger of right hand 09/23/2016    Priority: Low   Former smoker 03/13/2015    Priority: Low   COPD, frequent exacerbations (HCC) 03/29/2011    Priority: Low   Pulmonary nodule, right 03/29/2011    Priority: Low   Acute non-recurrent sinusitis 02/20/2017   Past Surgical History:  Procedure Laterality Date   CHOLECYSTECTOMY     COLONOSCOPY  2019   Dr.Gupta   LUNG CANCER SURGERY  2010   Left upper   POLYPECTOMY     VAGINAL HYSTERECTOMY     including ovaries and fallopian tubes    Family History  Problem Relation Age of Onset   Epilepsy Mother         hurt at birth, birth trauma reltaed   Hypertension Father    CVA Father        hemorrhagic   Other Brother        22 possible heart attack   Lung cancer Brother    Breast cancer Neg Hx    Colon cancer Neg Hx    Esophageal cancer Neg Hx    Stomach cancer Neg Hx     Medications- reviewed and updated Current Outpatient Medications  Medication Sig Dispense Refill   acetaminophen (TYLENOL) 500 MG tablet Take 500 mg by mouth every 6 (six) hours as needed.     albuterol (PROAIR HFA) 108 (90 Base) MCG/ACT inhaler Inhale 1-2 puffs into the lungs every 6 (six) hours as needed for wheezing or shortness of breath. Reported on 02/21/2016 18 g 5   Calcium-Magnesium-Vitamin D 600-40-500 MG-MG-UNIT TB24 Take by mouth.     meloxicam (MOBIC) 15 MG tablet Take 1 tablet (15 mg total) by mouth daily as needed. 90 tablet 1   Tiotropium Bromide-Olodaterol (STIOLTO RESPIMAT) 2.5-2.5 MCG/ACT AERS Inhale 2 puffs into the lungs daily. Pt needs appt for further refills. 4 g 11   atorvastatin (LIPITOR) 80 MG tablet Take 1 tablet (80 mg total) by mouth daily. 90 tablet 3   No current facility-administered  medications for this visit.    Allergies-reviewed and updated Allergies  Allergen Reactions   Codeine     REACTION: nausea   Penicillins     REACTION: rash    Social History   Social History Narrative   Married (husband Nedra Hai also Dr. Durene Cal patient) 1987. 3 children (2 living, son died 04-23-21unfortunately). 3 grandchildren.       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       Hobbies: shopping (husband will go with her) for grandkids.    Objective  Objective:  BP 120/84   Pulse 75   Temp (!) 97.2 F (36.2 C)   Ht 5\' 3"  (1.6 m)   Wt 137 lb (62.1 kg)   SpO2 99%   BMI 24.27 kg/m  Gen: NAD, resting comfortably HEENT: Mucous membranes are moist. Oropharynx normal Neck: no thyromegaly CV: RRR no murmurs rubs or gallops Lungs: CTAB no crackles, wheeze,  rhonchi Abdomen: soft/nontender/nondistended/normal bowel sounds. No rebound or guarding.  Ext: no edema Skin: warm, dry Neuro: grossly normal, moves all extremities, PERRLA   Assessment and Plan   72 y.o. female presenting for annual physical.  Health Maintenance counseling: 1. Anticipatory guidance: Patient counseled regarding regular dental exams -q6 months, eye exams - needs to update,  avoiding smoking and second hand smoke , limiting alcohol to 1 beverage per day- doesn't drink , no illicit drugs .   2. Risk factor reduction:  Advised patient of need for regular exercise and diet rich and fruits and vegetables to reduce risk of heart attack and stroke.  Exercise- walking daily- getting at least 5k steps- tries to go to [employer] to get steps in- up to 10k.  Diet/weight management-weight largely stable.  Wt Readings from Last 3 Encounters:  09/05/22 137 lb (62.1 kg)  01/28/22 138 lb (62.6 kg)  01/19/22 137 lb (62.1 kg)  3. Immunizations/screenings/ancillary studies-discussed Shingrix at pharmacy again, discussed RSV and COVID-19 vaccination  Immunization History  Administered Date(s) Administered   Fluad Quad(high Dose 65+) 07/04/2020   Influenza Split 05/20/2012   Influenza Whole 06/06/2009, 04/15/2012   Influenza, High Dose Seasonal PF 05/26/2018, 04/19/2019, 04/19/2019, 07/02/2021, 07/03/2022   Influenza-Unspecified 06/15/2013, 08/09/2014, 07/21/2015, 07/16/2016, 07/14/2017, 05/26/2018   Moderna SARS-COV2 Booster Vaccination 08/24/2020, 01/08/2021   Moderna Sars-Covid-2 Vaccination 11/10/2019, 12/08/2019, 08/24/2020   PNEUMOCOCCAL CONJUGATE-20 10/31/2020   Pneumococcal Conjugate-13 09/22/2014   Pneumococcal Polysaccharide-23 02/21/2016   Tdap 09/05/2014, 01/19/2022  4. Cervical cancer screening- no history abnormal pap smear. Past age based screening recommendations. No blood or discharge noted from vagina .  5. Breast cancer screening-  breast exam- prefers self exams and  mammogram 09/12/21 and advised yearly 6. Colon cancer screening -  last colonoscopy 07/17/2021 and was told no more colonoscopies needed due to age-Per guidelines-has had prior polyps 7. Skin cancer screening-  follows with Dr. 07/19/2021 in [place] as has history of skin cancer. Advised regular sunscreen use. Denies worrisome, changing, or new skin lesions.  8. Birth control/STD check- monogamous/postmenopausal  9. Osteoporosis screening at 38- see discussion  below.  10. Smoking associated screening - former smoker - 40 pack years and quit in 2010. Enrolled in lung cancer screening - will get UA today with labs  Status of chronic or acute concerns   #social update-  a lot of death in the family- unfortunately son in law passed in last month  # Osteoporosis S: Last DEXA: 09/07/2018. repeat 01/2022 -3.0 was stable  Medication (bisphosphonate or prolia): She had been on Fosamax since February 2018 through June 2023. Prior severe back pain with reclast. Plan 5 years therapy.    Calcium: 1200mg  (through diet ok) recommended  Vitamin D: 1000 units a day recommended  A/P:suspect stable- repeat June 2025 or later   #COPD/emphysema S: Compliant with Anoro--> stiolto due to expense.  Using albuterol - almost never  A/P: doing really well- continue current medications    #Hyperlipidemia/coronary artery calcification/aortic atherosclerosis-has seen Dr. July 2025 in the past S: Compliant with atorvastatin 80 mg. Lab Results  Component Value Date   CHOL 160 02/26/2021   HDL 55.30 02/26/2021   LDLCALC 85 02/26/2021   LDLDIRECT 72.0 01/28/2022   TRIG 98.0 02/26/2021   CHOLHDL 3 02/26/2021  A/P: hopefully lipids  at least stable - prefer LDL under 70 - update today but for now continue current medications   Aortic atherosclerosis (presumed stable)- LDL goal ideally <70   # Palpitations-noted in May 2023 with cardiac event monitor 04/11/2022 showing "Sinus rhythm Rare premature atrial contractions Rare  premature ventricular contractions" -no further issues- will monitor   #Chronic low back pain S: Previously Followed with Dr. 04/13/2022- he retired 2020- going to be with Dr. 2021 in [place].  Percocet through Dr. Marikay Alar in past- now off  As long as she is careful.   We prescribed Mobic (sparingly- otherwise takes aleve).  We will follow kidney function at least every 6 months. Also some tylenol.   A few months back had some left lateral hip pain- could have been back related. Thankfully has resolved. If recurs- could get her back in with ortho. Still taking meloxicam A/P: back issues overall stable- wonder if hip issue was radiating from the back but has resolved- can refer to ortho if needed- or she can call them.    % History of lung cancer with lobectomy in June 2010-  still does lung cancer screening- q6 month follow up with nodule being followed  Recommended follow up: Return in about 1 year (around 09/06/2023) for physical or sooner if needed.Schedule b4 you leave. Future Appointments  Date Time Provider Department Center  10/31/2022  2:30 PM [facility]   Lab/Order associations: fasting   ICD-10-CM   1. Preventative health care  Z00.00     2. Age related osteoporosis, unspecified pathological fracture presence  M81.0 Vitamin D (25 hydroxy)    3. Hyperlipidemia, unspecified hyperlipidemia type  E78.5 CBC with Differential/Platelet    Comprehensive metabolic panel    Lipid panel    4. Chronic obstructive pulmonary disease, unspecified COPD type (HCC)  J44.9     5. Aortic atherosclerosis (HCC)  I70.0     6. Former smoker  Z87.891 Urinalysis, Routine w reflex microscopic      Meds ordered this encounter  Medications   atorvastatin (LIPITOR) 80 MG tablet    Sig: Take 1 tablet (80 mg total) by mouth daily.    Dispense:  90 tablet    Refill:  3   Return precautions advised.  10-11-1985, MD

## 2022-09-05 NOTE — Patient Instructions (Addendum)
Please check with your pharmacy to see if they have the shingrix vaccine. If they do- please get this immunization and update [place] by phone call or mychart with dates you receive the vaccine -consider RSV and covid at pharmacy with husband  Please stop by lab before you go If you have mychart- we will send your results within 3 business days of [place] receiving them.  If you do not have mychart- we will call you about results within 5 business days of [place] receiving them.  *please also note that you will see labs on mychart as soon as they post. I will later go in and write notes on them- will say "notes from Dr. Durene Cal"   Recommended follow up: Return in about 1 year (around 09/06/2023) for physical or sooner if needed.Schedule b4 you leave.

## 2022-09-06 ENCOUNTER — Encounter: Payer: Self-pay | Admitting: Family Medicine

## 2022-10-17 ENCOUNTER — Encounter: Payer: Self-pay | Admitting: Radiology

## 2022-10-31 ENCOUNTER — Ambulatory Visit: Payer: Medicare PPO

## 2022-11-15 ENCOUNTER — Telehealth: Payer: Self-pay | Admitting: Acute Care

## 2022-11-15 NOTE — Telephone Encounter (Signed)
Pt. Has a Ct sched. This week and she has not see a provider in over 3 years here and she is working with Elie Confer with the lung cancer screening program and now needs to know who to follow up with because she has not been into the office in a long time and we please call back and advise

## 2022-11-15 NOTE — Telephone Encounter (Signed)
Brittney Busing,  Do you want this patient to follow up with Sarah in office after the [place] that occurs in May.   I see that it is scheduled  Let me know please

## 2022-11-20 NOTE — Telephone Encounter (Signed)
Pt called the office due to it being a long time since she had seen Dr. Chase Caller. An appt was made for pt to see Dr. Chase Caller 12/03/22.  Pt is currently scheduled for the [facility] 5/20. After that, pt can be scheduled for a visit with Judson Roch if needed.

## 2022-11-20 NOTE — Telephone Encounter (Signed)
Pt. Called back and mad F/u with Dr. Chase Caller

## 2022-11-26 ENCOUNTER — Other Ambulatory Visit: Payer: Self-pay

## 2022-11-26 MED ORDER — MELOXICAM 15 MG PO TABS: 15.0000 mg | ORAL_TABLET | Freq: Every day | ORAL | 3 refills | Status: DC | PRN

## 2022-12-03 ENCOUNTER — Ambulatory Visit: Payer: Medicare PPO | Admitting: Internal Medicine

## 2022-12-03 ENCOUNTER — Telehealth: Payer: Self-pay | Admitting: Acute Care

## 2022-12-03 ENCOUNTER — Encounter: Payer: Self-pay | Admitting: Internal Medicine

## 2022-12-03 VITALS — BP 144/78 | HR 74 | Temp 98.2°F | Ht 63.0 in | Wt 138.4 lb

## 2022-12-03 DIAGNOSIS — R911 Solitary pulmonary nodule: Secondary | ICD-10-CM | POA: Diagnosis not present

## 2022-12-03 DIAGNOSIS — J439 Emphysema, unspecified: Secondary | ICD-10-CM | POA: Diagnosis not present

## 2022-12-03 DIAGNOSIS — Z7185 Encounter for immunization safety counseling: Secondary | ICD-10-CM | POA: Diagnosis not present

## 2022-12-03 DIAGNOSIS — Z87891 Personal history of nicotine dependence: Secondary | ICD-10-CM

## 2022-12-03 MED ORDER — SPIRIVA RESPIMAT 1.25 MCG/ACT IN AERS: 2.0000 | INHALATION_SPRAY | Freq: Every day | RESPIRATORY_TRACT | 0 refills | Status: DC

## 2022-12-03 MED ORDER — SPIRIVA RESPIMAT 1.25 MCG/ACT IN AERS: 2.0000 | INHALATION_SPRAY | Freq: Every day | RESPIRATORY_TRACT | 3 refills | Status: DC

## 2022-12-03 NOTE — Telephone Encounter (Signed)
Attempt to call regarding question on cost of follow up LDCT/6 months.  Left VM and call back number. Also left message for CPT code [id number] for patient to reach out to insurance for out of pocket expense for this type of CT.

## 2022-12-03 NOTE — Progress Notes (Signed)
02/21/2016 Annual Follow UP: Pt. Continues to do well on her Anoro once daily. She rarely uses her rescue inhaler. CAT score is less than 10. CT scan of the chest is unchanged from the 2016 scan. She denies fever, chest pain, orthopnea or hemoptysis.She will get the Pneumovax vaccine today in the office.  Tests CT Chest Nodule Follow Up 02/06/2016:  IMPRESSION: 1. Previously noted right upper lobe nodules are unchanged, and considered benign. No follow-up recommended. This recommendation follows the consensus statement: Guidelines for Management of Incidental Pulmonary Nodules Detected on CT Images:From the Fleischner Society 2017; published online before print (10.1148/[id number]). 2. Mild diffuse bronchial wall thickening with mild centrilobular and paraseptal emphysema; imaging findings suggestive of underlying COPD. 3. Atherosclerosis, including left main and 2 vessel coronary artery disease. Please note that although the presence of coronary artery calcium documents the presence of coronary artery disease, the severity of this disease and any potential stenosis cannot be assessed on this non-gated CT examination. Assessment for potential risk factor modification, dietary therapy or pharmacologic therapy      OV 02/20/2017  Chief Complaint  Patient presents with   Acute Visit    Pt has had either a cold or sinus problems. Pt was put on  Z-Pack and prednisone June 18 and has finished taking medicine. Pt has increased fatigue, mild cough, clear nasal drainage, and SOB.    Follow-up multiple issues  #Acute issue: A few weeks ago she called me through her daughter. She had sinus symptoms. We called in Z-Pak and prednisone. She started off with a fever for one day as is with green sputum and some drainage. Since then she's significantly better. She still has a mild residual cough and clear runny nose that is higher than baseline. She also is associated fatigue that is  mild. This is new for her. She feels her recovery phases slower and therefore she is concerned.  COPD: This is stable. COPD CAT Score is 7 today. She is on dual anticholinergic and long-acting beta agonist inhaler  Lung cancer surveillance: She needs a one year follow-up CT scan scheduled this summer 2018. This will be 7 years since she had cancer. As of now she is in complete remission.   OV 04/06/2018  Chief Complaint  Patient presents with   Follow-up    Pt states she has been doing well since last visit. States she has had some SOB with the heat, has had an occ. CP. Denies any cough.      Fu  Lung cancer post Rx surveillance and screenig - CT July 2019 with new GGO 8mm in RUL but otherwise benign CT  Emphysema: well. CAT score is 4 and better. On anoro  Overall doing reall well   CAT COPD Symptom & Quality of Life Score (GSK trademark) 0 is no burden. 5 is highest burden 02/20/2017  04/06/2018   Never Cough -> Cough all the time 1 1  No phlegm in chest -> Chest is full of phlegm 0 0  No chest tightness -> Chest feels very tight 0 0  No dyspnea for 1 flight stairs/hill -> Very dyspneic for 1 flight of stairs 0 3  No limitations for ADL at home -> Very limited with ADL at home 0 0  Confident leaving home -> Not at all confident leaving home 1 0  Sleep soundly -> Do not sleep soundly because of lung condition 0 0  Lots of Energy -> No energy at all 4 0  TOTAL Score (max 40)  7 4      has a past medical history of Complication of anesthesia, COPD (chronic obstructive pulmonary disease) (HCC), Depression, Hyperlipidemia, Lung cancer (HCC) (2010), and Osteoporosis.   reports that she quit smokin  OV 07/04/2020  Subjective:  Patient ID: [name], female , DOB: 1951/03/09 , age 72 y.o. , MRN: [id number] , ADDRESS: [address] [place] [address] PCP Shelva Majestic, MD Patient Care Team: Shelva Majestic, MD as PCP - General (Family Medicine)  This Provider for  this visit: Treatment Team:  Attending Provider: Kalman Shan, MD    07/04/2020 -   Chief Complaint  Patient presents with   Follow-up    No complaints currently     HPI [name] 72 y.o. -last seen for lung cancer surveillance and pulmonary emphysema in August 2019.  Then I took a telephone call in December 2020 for Covid.  Arrange for monoclonal antibody infusion and she survived Covid.  Currently she is feeling baseline.  She is using Stiolto.  She feels good smoking is in remission.  In October 2020 when she had lung cancer surveillance CT and shows in remission.  She has nodules.  She requires only 1 year follow-up CT.  She wants to have a high-dose flu shot today.  She said to have a Covid booster but she is had the opportunity vaccine mRNA.  She is willing to get a Covid IgG checked.  Her husband is with her today.  CT chest Oct 2021 Lungs/Pleura: Previous left upper lobectomy. The previous tiny right apical lung nodule has resolved in the interval. Two non solid nodules are identified on today's exam which have a an equivalent diameter of 5.2 mm. These are unchanged from previous exam. There is a tiny nodule within the lateral right upper lobe which has an equivalent diameter of 2.4 mm and is unchanged. No new suspicious lung nodules identified.   Upper Abdomen: No acute abnormality within the imaged portions of the upper abdomen. Previous cholecystectomy. No biliary ductal dilatation. Scattered low-attenuation liver foci are again noted and appear unchanged likely representing multiple small cysts. The largest of these is in the right hepatic lobe posteriorly measuring 3 cm.   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 No acute or suspicious osseous abnormality.   IMPRESSION: 1. Lung-RADS 2, benign appearance or behavior. Continue annual screening with low-dose chest CT without contrast in 12 months. 2. Coronary artery  calcifications. 3. Aortic atherosclerosis.   Aortic Atherosclerosis (ICD10-I70.0).     Electronically Signed   By: Signa Kell M.D.   On: 05/29/2020 15:47   OV 12/03/2022 - -new consult because it has been more than 3 years since I personally saw her.  Subjective:  Patient ID: [name], female , DOB: 05/16/1951 , age 4 y.o. , MRN: [id number] , ADDRESS: [address] [place] [address] PCP Shelva Majestic, MD Patient Care Team: Shelva Majestic, MD as PCP - General (Family Medicine)  This Provider for this visit: Treatment Team:  Attending Provider: Kalman Shan, MD    12/03/2022 -   Chief Complaint  Patient presents with   Acute Visit    Pt states developed a cold several wks ago- cough lingering since then. Her cough is occ prod with minimal brown sputum.      HPI [name] 72 y.o. -she is a lung cancer survivor status post lobectomy.  After that she has been  undergoing annual low-dose CT scans of the chest.  Last CT scan was in November 2023.  1 year follow-up was recommended by Dr. Fredirick Lathe.  But the lung cancer screening program did review that CT scan and have ordered as short-term follow-up in 6 months in May 2024.  This is now scheduled.  She is now presenting with her husband because towards the end of January 2024/February 2024 she did call me with a COPD exacerbation and given antibiotics and prednisone over the phone [not documented because this happened over the weekend].  She says she is better from that but she is left with some residual cold and cough and whistling noise.  But she is improving rapidly.  She does note that she has not been taking her Stiolto beyond more than once a week for the last few years.  This is because the Stiolto does make her throat hoarse.  I did indicate to her that other inhalers are dry powdered inhalers and the potential to make a throat even worse.  We talked about Yupelri as a nebulizer option.  However it is a  little bit more inconvenient and she rejected the idea.  Therefore we settled and took a shared decision making to do Spiriva Respimat once daily.  I did indicate to her that likely Spiriva is probably contributing to the slow resolution from her recent COPD flareup.  Reviewed her vaccines and she is in need of a shingles vaccine which she is working with on a primary care physician.  She has not had pulmonary function test in a long time and I recommended this.     CT chet LDCT 06/27/22   arrative & Impression  CLINICAL DATA:  Former smoker, quit 13 years ago, 41 pack-year history.   EXAM: CT CHEST WITHOUT CONTRAST LOW-DOSE FOR LUNG CANCER SCREENING   TECHNIQUE: Multidetector CT imaging of the chest was performed following the standard protocol without IV contrast.   RADIATION DOSE REDUCTION: This exam was performed according to the departmental dose-optimization program which includes automated exposure control, adjustment of the mA and/or kV according to patient size and/or use of iterative reconstruction technique.   COMPARISON:  06/27/2021.   FINDINGS: Cardiovascular: Atherosclerotic calcification of the aorta and coronary arteries. Heart size normal. No pericardial effusion.   Mediastinum/Nodes: No pathologically enlarged mediastinal or axillary lymph nodes. Hilar regions are difficult to definitively evaluate without IV contrast. Esophagus is unremarkable.   Lungs/Pleura: Biapical pleuroparenchymal scarring. Centrilobular emphysema. Left upper lobectomy. Solid and ground-glass nodules measure up to 11.0 mm, as before. No new or suspicious pulmonary nodules. No pleural fluid. Airway is otherwise unremarkable.   Upper Abdomen: Low-attenuation lesions in the liver measure up to 4.2 cm, indicative of cysts. Cholecystectomy. Visualized portions of the adrenal glands, kidneys, spleen, pancreas, stomach and bowel are grossly unremarkable. No upper abdominal adenopathy.    Musculoskeletal: Degenerative changes in the spine. Old left rib fractures.   IMPRESSION: 1. Lung-RADS 2, benign appearance or behavior. Continue annual screening with low-dose chest CT without contrast in 12 months. 2. Aortic atherosclerosis (ICD10-I70.0). Coronary artery calcification. 3.  Emphysema (ICD10-J43.9).     Electronically Signed   By: Leanna Battles M.D.   On: 06/30/2022 10:20              has a past medical history of Complication of anesthesia, COPD (chronic obstructive pulmonary disease), Depression, Hyperlipidemia, Lung cancer (2010), and Osteoporosis.   reports that she quit smoking about 13 years ago. Her smoking use  included cigarettes. She has a 40.00 pack-year smoking history. She has never used smokeless tobacco.  Past Surgical History:  Procedure Laterality Date   CHOLECYSTECTOMY     COLONOSCOPY  2019   Dr.Gupta   LUNG CANCER SURGERY  2010   Left upper   POLYPECTOMY     VAGINAL HYSTERECTOMY     including ovaries and fallopian tubes    Allergies  Allergen Reactions   Codeine     REACTION: nausea   Penicillins     REACTION: rash    Immunization History  Administered Date(s) Administered   Fluad Quad(high Dose 65+) 07/04/2020   Influenza Split 05/20/2012   Influenza Whole 06/06/2009, 04/15/2012   Influenza, High Dose Seasonal PF 05/26/2018, 04/19/2019, 04/19/2019, 07/02/2021, 07/03/2022   Influenza-Unspecified 06/15/2013, 08/09/2014, 07/21/2015, 07/16/2016, 07/14/2017, 05/26/2018   Moderna SARS-COV2 Booster Vaccination 08/24/2020, 01/08/2021   Moderna Sars-Covid-2 Vaccination 11/10/2019, 12/08/2019, 08/24/2020   PNEUMOCOCCAL CONJUGATE-20 10/31/2020   Pneumococcal Conjugate-13 09/22/2014   Pneumococcal Polysaccharide-23 02/21/2016   Respiratory Syncytial Virus Vaccine,Recomb Aduvanted(Arexvy) 09/06/2022   Tdap 09/05/2014, 01/19/2022    Family History  Problem Relation Age of Onset   Epilepsy Mother        hurt at birth, birth  trauma reltaed   Hypertension Father    CVA Father        hemorrhagic   Other Brother        64 possible heart attack   Lung cancer Brother    Breast cancer Neg Hx    Colon cancer Neg Hx    Esophageal cancer Neg Hx    Stomach cancer Neg Hx      Current Outpatient Medications:    acetaminophen (TYLENOL) 500 MG tablet, Take 500 mg by mouth every 6 (six) hours as needed., Disp: , Rfl:    albuterol (PROAIR HFA) 108 (90 Base) MCG/ACT inhaler, Inhale 1-2 puffs into the lungs every 6 (six) hours as needed for wheezing or shortness of breath. Reported on 02/21/2016, Disp: 18 g, Rfl: 5   atorvastatin (LIPITOR) 80 MG tablet, Take 1 tablet (80 mg total) by mouth daily., Disp: 90 tablet, Rfl: 3   Calcium-Magnesium-Vitamin D 600-40-500 MG-MG-UNIT TB24, Take by mouth., Disp: , Rfl:    Coenzyme Q10 (CO Q 10 PO), Take 1 capsule by mouth daily., Disp: , Rfl:    meloxicam (MOBIC) 15 MG tablet, Take 1 tablet (15 mg total) by mouth daily as needed., Disp: 90 tablet, Rfl: 3   Tiotropium Bromide-Olodaterol (STIOLTO RESPIMAT) 2.5-2.5 MCG/ACT AERS, Inhale 2 puffs into the lungs daily. Pt needs appt for further refills., Disp: 4 g, Rfl: 11      Objective:   Vitals:   12/03/22 1033  BP: (!) 144/78  Pulse: 74  Temp: 98.2 F (36.8 C)  TempSrc: Oral  SpO2: 96%  Weight: 138 lb 6.4 oz (62.8 kg)  Height:  (1.6 m)    Estimated body mass index is 24.52 kg/m as calculated from the following:   Height as of this encounter:  (1.6 m).   Weight as of this encounter: 138 lb 6.4 oz (62.8 kg).  @  Filed Weights   12/03/22 1033  Weight: 138 lb 6.4 oz (62.8 kg)     Physical Exam    General: No distress. Looks wel Neuro: Alert and Oriented x 3. GCS 15. Speech normal Psych: Pleasant Resp:  Barrel Chest - no.  Wheeze - no, Crackles - no, No overt respiratory distress CVS: Normal heart sounds. Murmurs - no Ext: Stigmata  of Connective Tissue Disease - no HEENT: Normal upper airway.  PEERL +. No post nasal drip        Assessment:       ICD-10-CM   1. Pulmonary emphysema, unspecified emphysema type  J43.9     2. Lung nodule  R91.1     3. Former smoker  Z87.891     4. Vaccine counseling  Z71.85          Plan:     Patient Instructions     ICD-10-CM   1. Pulmonary emphysema, unspecified emphysema type  J43.9     2. Lung nodule  R91.1     3. Former smoker  Z87.891     4. Vaccine counseling  Z71.85         Encounter for screening for malignant neoplasm of respiratory organs  - benign looking Nov 2023 but per internal lung cancer team they want CT sooner than 1 year  Plan  - May 2024 CT per Kandice Robinsons (I am double checking insurance will cover)  Pulmonary emphysema, unspecified emphysema type (HCC)  - stable but stiolto causing sore throast  Plan  -chane stiolto to spiriva respimat low dose daily   - albuterol as needed  - call in a few weeks to report if spiriva working btter than stiolto  Vaccine counseling Flu vaccine need  Plan  - Please talk to PCP Shelva Majestic, MD -  and ensure you get  shingrix (GSK) inactivated vaccine against shingles    Followup  -3 - 4 months but after PFT; 15 min visit with Ramaswam    SIGNATURE    Dr. Kalman Shan, M.D., F.C.C.P,  Pulmonary and Critical Care Medicine Staff Physician, [facility] Center Director - Interstitial Lung Disease  Program  Pulmonary Fibrosis [facility] at [facility] [place], [place], [address]  Pager: [phone], If no answer or between  15:00h - 7:00h: call [phone] Telephone: [phone]  11:08 AM 12/03/2022

## 2022-12-03 NOTE — Patient Instructions (Addendum)
ICD-10-CM   1. Pulmonary emphysema, unspecified emphysema type  J43.9     2. Lung nodule  R91.1     3. Former smoker  Z87.891     4. Vaccine counseling  Z71.85         Encounter for screening for malignant neoplasm of respiratory organs  - benign looking Nov 2023 but per internal lung cancer team they want CT sooner than 1 year  Plan  - May 2024 CT per Kandice Robinsons (I am double checking insurance will cover)  Pulmonary emphysema, unspecified emphysema type (HCC)  - stable but stiolto causing sore throast  Plan  -chane stiolto to spiriva respimat low dose daily   - albuterol as needed  - call in a few weeks to report if spiriva working btter than stiolto  Vaccine counseling Flu vaccine need  Plan  - Please talk to PCP Shelva Majestic, MD -  and ensure you get  shingrix (GSK) inactivated vaccine against shingles    Followup  -3 - 4 months but after PFT; 15 min visit with Ramaswam

## 2023-01-06 ENCOUNTER — Ambulatory Visit (HOSPITAL_COMMUNITY)
Admission: RE | Admit: 2023-01-06 | Discharge: 2023-01-06 | Disposition: A | Discharge status: Home / Self Care | Payer: Medicare PPO | Source: Ambulatory Visit | Attending: Acute Care | Admitting: Acute Care

## 2023-01-06 DIAGNOSIS — R911 Solitary pulmonary nodule: Secondary | ICD-10-CM

## 2023-01-06 DIAGNOSIS — Z87891 Personal history of nicotine dependence: Secondary | ICD-10-CM | POA: Diagnosis not present

## 2023-01-06 DIAGNOSIS — R918 Other nonspecific abnormal finding of lung field: Secondary | ICD-10-CM | POA: Diagnosis not present

## 2023-01-06 DIAGNOSIS — J439 Emphysema, unspecified: Secondary | ICD-10-CM | POA: Diagnosis not present

## 2023-01-10 ENCOUNTER — Telehealth: Payer: Self-pay | Admitting: Acute Care

## 2023-01-10 NOTE — Telephone Encounter (Signed)
I have called the patient with the results of her low dose Ct Chest. It was read as a LR  4 A.  She has a history of lung cancer , so she does not qualify for Nodify. Plan id for a 3 month follow up. I have messaged Dr. Tonia Brooms . If he wants to do something sooner we will call the patient and let her know.   He just messaged me back. Icard would like her put in a nodule spot for clinic with him to discuss. Can you all call her next week  and let her know and add her to his first available nodule slot? Thanks so much  Please fax results to PCP and let them know the plan.   Dr. Marchelle Gearing, FYI since you helped treat her 2010 lung cancer.

## 2023-01-10 NOTE — Telephone Encounter (Signed)
Also routing to lung nodule pool.

## 2023-01-10 NOTE — Telephone Encounter (Signed)
Received call report from Tiffany at [facility] for CT scan. Impression has been posted below  IMPRESSION: Lung-RADS 4A, suspicious. Follow up low-dose chest CT without contrast in 3 months (please use the following order, CT CHEST LCS NODULE FOLLOW-UP W/O CM) is recommended. Alternatively, PET may be considered when there is a solid component 8mm or larger.   Progressive 12.4 mm part solid left lower lobe nodule with new 3.4 mm solid component. Follow-up low-dose lung cancer screening CT chest is suggested in 3 months.   Aortic Atherosclerosis (ICD10-I70.0) and Emphysema (ICD10-J43.9).

## 2023-01-10 NOTE — Telephone Encounter (Signed)
Tiffany calling with call report. For CT scan.Tiffany phone number is [phone]. 

## 2023-01-10 NOTE — Telephone Encounter (Signed)
Called and left VM for pt to call back.   Can schedule with Dr. Tonia Brooms on 6/6 nodule clinic.

## 2023-01-14 NOTE — Telephone Encounter (Signed)
Pt has been scheduled for a 30 min appt with Dr. Tonia Brooms on 6/6.   Will forward to Dr. Tonia Brooms as Lorain Childes.

## 2023-01-23 ENCOUNTER — Encounter: Payer: Self-pay | Admitting: Pulmonary Disease

## 2023-01-23 ENCOUNTER — Ambulatory Visit: Payer: Medicare PPO | Admitting: Pulmonary Disease

## 2023-01-23 VITALS — BP 120/84 | HR 86 | Ht 63.0 in | Wt 139.0 lb

## 2023-01-23 DIAGNOSIS — R918 Other nonspecific abnormal finding of lung field: Secondary | ICD-10-CM

## 2023-01-23 DIAGNOSIS — R911 Solitary pulmonary nodule: Secondary | ICD-10-CM

## 2023-01-23 NOTE — Progress Notes (Signed)
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 Ofilia Neas.  She has done well with post surveillance imaging.  No issues or concern for recurrence.  She is enrolled in lung cancer screening program.  She has had some small scattered groundglass opacities within the chest.  1 of which that has enlarged almost doubled in size in the past 2 years now has a 3 mm solid component.  It is concerning for an indolent carcinoma.     Past Medical History:  Diagnosis Date   Complication of anesthesia    per pt, "hard to wake up past sedation".   COPD (chronic obstructive pulmonary disease) (HCC)    Depression    previous rx, not in years   Hyperlipidemia    Lung cancer (HCC) 2010   Left upper lung- lobectomy   Osteoporosis      Family History  Problem Relation Age of Onset   Epilepsy Mother        hurt at birth, birth trauma reltaed   Hypertension Father    CVA Father        hemorrhagic   Other Brother        70 possible heart attack   Lung cancer Brother    Breast cancer Neg Hx    Colon cancer Neg Hx    Esophageal cancer Neg Hx    Stomach cancer Neg Hx      Past Surgical History:  Procedure Laterality Date   CHOLECYSTECTOMY     COLONOSCOPY  2019   Dr.Gupta   LUNG CANCER SURGERY  2010   Left upper   POLYPECTOMY     VAGINAL HYSTERECTOMY     including ovaries and fallopian tubes    Social History   Socioeconomic History   Marital status: Married    Spouse name: Not on file   Number of children: Not on file   Years of education: Not on file   Highest education level:  Not on file  Occupational History   Not on file  Tobacco Use   Smoking status: Former    Packs/day: 1.00    Years: 40.00    Additional pack years: 0.00    Total pack years: 40.00    Types: Cigarettes    Quit date: 12/17/2008    Years since quitting: 14.1   Smokeless tobacco: Never  Vaping Use   Vaping Use: Never used  Substance and Sexual Activity   Alcohol use: No   Drug use: No   Sexual activity: Not on file  Other Topics Concern   Not on file  Social History Narrative   Married (husband Nedra Hai also Dr. Durene Cal patient) 1987. 3 children (2 living, son died 13-Apr-2021unfortunately). 3 grandchildren.       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       Hobbies: shopping (husband will go with her) for grandkids.    Social Determinants of Health   Financial Resource Strain: Low Risk  (10/23/2021)   Overall Financial Resource Strain (CARDIA)  Difficulty of Paying Living Expenses: Not hard at all  Food Insecurity: No Food Insecurity (10/23/2021)   Hunger Vital Sign    Worried About Running Out of Food in the Last Year: Never true    Ran Out of Food in the Last Year: Never true  Transportation Needs: No Transportation Needs (10/23/2021)   PRAPARE - Administrator, Civil Service (Medical): No    Lack of Transportation (Non-Medical): No  Physical Activity: Insufficiently Active (10/23/2021)   Exercise Vital Sign    Days of Exercise per Week: 3 days    Minutes of Exercise per Session: 30 min  Stress: No Stress Concern Present (10/23/2021)   [employer] of Occupational Health - Occupational Stress Questionnaire    Feeling of Stress : Not at all  Social Connections: Moderately Integrated (10/23/2021)   Social Connection and Isolation Panel [NHANES]    Frequency of Communication with Friends and Family: More than three times a week    Frequency of Social Gatherings with Friends and Family: More than three times a week    Attends  Religious Services: More than 4 times per year    Active Member of [employer] or Organizations: No    Attends Banker Meetings: Never    Marital Status: Married  Catering manager Violence: Not At Risk (10/23/2021)   Humiliation, Afraid, Rape, and Kick questionnaire    Fear of Current or Ex-Partner: No    Emotionally Abused: No    Physically Abused: No    Sexually Abused: No     Allergies  Allergen Reactions   Codeine     REACTION: nausea   Penicillins     REACTION: rash     Outpatient Medications Prior to Visit  Medication Sig Dispense Refill   acetaminophen (TYLENOL) 500 MG tablet Take 500 mg by mouth every 6 (six) hours as needed.     albuterol (PROAIR HFA) 108 (90 Base) MCG/ACT inhaler Inhale 1-2 puffs into the lungs every 6 (six) hours as needed for wheezing or shortness of breath. Reported on 02/21/2016 18 g 5   atorvastatin (LIPITOR) 80 MG tablet Take 1 tablet (80 mg total) by mouth daily. 90 tablet 3   Calcium-Magnesium-Vitamin D 600-40-500 MG-MG-UNIT TB24 Take by mouth.     Coenzyme Q10 (CO Q 10 PO) Take 1 capsule by mouth daily.     meloxicam (MOBIC) 15 MG tablet Take 1 tablet (15 mg total) by mouth daily as needed. 90 tablet 3   Tiotropium Bromide Monohydrate (SPIRIVA RESPIMAT) 1.25 MCG/ACT AERS Inhale 2 puffs into the lungs daily. 4 g 3   Tiotropium Bromide Monohydrate (SPIRIVA RESPIMAT) 1.25 MCG/ACT AERS Inhale 2 puffs into the lungs daily. 2 each 0   Tiotropium Bromide-Olodaterol (STIOLTO RESPIMAT) 2.5-2.5 MCG/ACT AERS Inhale 2 puffs into the lungs daily. Pt needs appt for further refills. 4 g 11   No facility-administered medications prior to visit.    Review of Systems  Constitutional:  Negative for chills, fever, malaise/fatigue and weight loss.  HENT:  Negative for hearing loss, sore throat and tinnitus.   Eyes:  Negative for blurred vision and double vision.  Respiratory:  Negative for cough, hemoptysis, sputum production, shortness of breath, wheezing and  stridor.   Cardiovascular:  Negative for chest pain, palpitations, orthopnea, leg swelling and PND.  Gastrointestinal:  Negative for abdominal pain, constipation, diarrhea, heartburn, nausea and vomiting.  Genitourinary:  Negative for dysuria, hematuria and urgency.  Musculoskeletal:  Negative for joint pain and myalgias.  Skin:  Negative for itching and rash.  Neurological:  Negative for dizziness, tingling, weakness and headaches.  Endo/Heme/Allergies:  Negative for environmental allergies. Does not bruise/bleed easily.  Psychiatric/Behavioral:  Negative for depression. The patient is not nervous/anxious and does not have insomnia.   All other systems reviewed and are negative.    Objective:  Physical Exam Vitals reviewed.  Constitutional:      General: She is not in acute distress.    Appearance: She is well-developed.  HENT:     Head: Normocephalic and atraumatic.  Eyes:     General: No scleral icterus.    Conjunctiva/sclera: Conjunctivae normal.     Pupils: Pupils are equal, round, and reactive to light.  Neck:     Vascular: No JVD.     Trachea: No tracheal deviation.  Cardiovascular:     Rate and Rhythm: Normal rate and regular rhythm.     Heart sounds: Normal heart sounds. No murmur heard. Pulmonary:     Effort: Pulmonary effort is normal. No tachypnea, accessory muscle usage or respiratory distress.     Breath sounds: No stridor. No wheezing, rhonchi or rales.  Abdominal:     General: There is no distension.     Palpations: Abdomen is soft.     Tenderness: There is no abdominal tenderness.  Musculoskeletal:        General: No tenderness.     Cervical back: Neck supple.  Lymphadenopathy:     Cervical: No cervical adenopathy.  Skin:    General: Skin is warm and dry.     Capillary Refill: Capillary refill takes less than 2 seconds.     Findings: No rash.  Neurological:     Mental Status: She is alert and oriented to person, place, and time.  Psychiatric:         Behavior: Behavior normal.      Vitals:   01/23/23 0857  BP: 120/84  Pulse: 86  SpO2: 97%  Weight: 139 lb (63 kg)  Height: 5\' 3"  (1.6 m)   97% on RA BMI Readings from Last 3 Encounters:  01/23/23 24.62 kg/m  12/03/22 24.52 kg/m  09/05/22 24.27 kg/m   Wt Readings from Last 3 Encounters:  01/23/23 139 lb (63 kg)  12/03/22 138 lb 6.4 oz (62.8 kg)  09/05/22 137 lb (62.1 kg)     CBC    Component Value Date/Time   WBC 6.2 09/05/2022 0939   RBC 5.06 09/05/2022 0939   HGB 14.6 09/05/2022 0939   HGB 14.3 01/10/2009 0919   HCT 42.0 09/05/2022 0939   HCT 41.5 01/10/2009 0919   PLT 292.0 09/05/2022 0939   PLT 315 01/10/2009 0919   MCV 82.9 09/05/2022 0939   MCV 85.6 01/10/2009 0919   MCH 29.4 01/10/2009 0919   MCHC 34.7 09/05/2022 0939   RDW 13.8 09/05/2022 0939   RDW 13.1 01/10/2009 0919   LYMPHSABS 1.8 09/05/2022 0939   LYMPHSABS 1.7 01/10/2009 0919   MONOABS 0.4 09/05/2022 0939   MONOABS 0.4 01/10/2009 0919   EOSABS 0.1 09/05/2022 0939   EOSABS 0.1 01/10/2009 0919   BASOSABS 0.0 09/05/2022 0939   BASOSABS 0.0 01/10/2009 0919     Chest Imaging:  May 2024 lung cancer screening nodule follow-up: CT imaging with bilateral scattered groundglass opacities largest of which is in the left upper lobe that has nearly doubled in size.  It is 12.4 mm part solid in the left lower lobe that is now developing 3.4 mm solid component.  Other  areas within the lung without any significant solid components. The patient's images have been independently reviewed by me.    Pulmonary Functions Testing Results:     No data to display          FeNO:   Pathology:   Echocardiogram:   Heart Catheterization:     Assessment & Plan:     ICD-10-CM   1. Lung nodule  R91.1 Procedural/ Surgical Case Request: ROBOTIC ASSISTED NAVIGATIONAL BRONCHOSCOPY    Ambulatory referral to Pulmonology    CT Super D Chest Wo Contrast    2. Multiple lung nodules  R91.8     3. Ground glass  opacity present on imaging of lung  R91.8       Discussion:  This is a 72 year old female, past medical history of tobacco use, history of squamous cell carcinoma of the left upper lobe now with lung cancer screening abnormality on CT imaging.  Found to have a groundglass opacity in the left lower lobe that had slowly enlarged over time now developing a more solid component. Also has other groundglass lesions concerning for potential underlying diagnosis of multifocal adenocarcinoma.  Plan: Today in the office with about the risk benefits alternatives consideration of bronchoscopy biopsy. We talked with the risk of bleeding and pneumothorax. Patient is agreeable proceed with bronchoscopy. We have selected a date for June 24. Patient on any blood thinners or antiplatelets. Will need super D CT imaging prior. She is on vacation the week prior to that and will not be able to have imaging done prior that week would need to get done the week before. I relayed this message to our schedulers.  We appreciate PCC's help with scheduling.     Current Outpatient Medications:    acetaminophen (TYLENOL) 500 MG tablet, Take 500 mg by mouth every 6 (six) hours as needed., Disp: , Rfl:    albuterol (PROAIR HFA) 108 (90 Base) MCG/ACT inhaler, Inhale 1-2 puffs into the lungs every 6 (six) hours as needed for wheezing or shortness of breath. Reported on 02/21/2016, Disp: 18 g, Rfl: 5   atorvastatin (LIPITOR) 80 MG tablet, Take 1 tablet (80 mg total) by mouth daily., Disp: 90 tablet, Rfl: 3   Calcium-Magnesium-Vitamin D 600-40-500 MG-MG-UNIT TB24, Take by mouth., Disp: , Rfl:    Coenzyme Q10 (CO Q 10 PO), Take 1 capsule by mouth daily., Disp: , Rfl:    meloxicam (MOBIC) 15 MG tablet, Take 1 tablet (15 mg total) by mouth daily as needed., Disp: 90 tablet, Rfl: 3   Tiotropium Bromide Monohydrate (SPIRIVA RESPIMAT) 1.25 MCG/ACT AERS, Inhale 2 puffs into the lungs daily., Disp: 4 g, Rfl: 3  I spent 62 minutes  dedicated to the care of this patient on the date of this encounter to include pre-visit review of records, face-to-face time with the patient discussing conditions above, post visit ordering of testing, clinical documentation with the electronic health record, making appropriate referrals as documented, and communicating necessary findings to members of the patients care team.   Josephine Igo, DO [facility] 01/23/2023 9:36 AM

## 2023-01-23 NOTE — Patient Instructions (Signed)
Thank you for visiting Dr. Tonia Brooms at [facility]. Today we recommend the following:  Orders Placed This Encounter  Procedures   Procedural/ Surgical Case Request: ROBOTIC ASSISTED NAVIGATIONAL BRONCHOSCOPY   CT Super D Chest Wo Contrast   Ambulatory referral to Pulmonology   Case on June 24th  Return in about 25 days (around 02/17/2023) for with Kandice Robinsons, NP, after Bronchoscopy.    Please do your part to reduce the spread of COVID-19.

## 2023-01-23 NOTE — H&P (View-Only) (Signed)
 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 Ed Gerhardt.  She has done well with post surveillance imaging.  No issues or concern for recurrence.  She is enrolled in lung cancer screening program.  She has had some small scattered groundglass opacities within the chest.  1 of which that has enlarged almost doubled in size in the past 2 years now has a 3 mm solid component.  It is concerning for an indolent carcinoma.     Past Medical History:  Diagnosis Date   Complication of anesthesia    per pt, "hard to wake up past sedation".   COPD (chronic obstructive pulmonary disease) (HCC)    Depression    previous rx, not in years   Hyperlipidemia    Lung cancer (HCC) 2010   Left upper lung- lobectomy   Osteoporosis      Family History  Problem Relation Age of Onset   Epilepsy Mother        hurt at birth, birth trauma reltaed   Hypertension Father    CVA Father        hemorrhagic   Other Brother        51 possible heart attack   Lung cancer Brother    Breast cancer Neg Hx    Colon cancer Neg Hx    Esophageal cancer Neg Hx    Stomach cancer Neg Hx      Past Surgical History:  Procedure Laterality Date   CHOLECYSTECTOMY     COLONOSCOPY  2019   Dr.Gupta   LUNG CANCER SURGERY  2010   Left upper   POLYPECTOMY     VAGINAL HYSTERECTOMY     including ovaries and fallopian tubes    Social History   Socioeconomic History   Marital status: Married    Spouse name: Not on file   Number of children: Not on file   Years of education: Not on file   Highest education level:  Not on file  Occupational History   Not on file  Tobacco Use   Smoking status: Former    Packs/day: 1.00    Years: 40.00    Additional pack years: 0.00    Total pack years: 40.00    Types: Cigarettes    Quit date: 12/17/2008    Years since quitting: 14.1   Smokeless tobacco: Never  Vaping Use   Vaping Use: Never used  Substance and Sexual Activity   Alcohol use: No   Drug use: No   Sexual activity: Not on file  Other Topics Concern   Not on file  Social History Narrative   Married (husband Lee also Dr. Hunter patient) 1987. 3 children (2 living, son died April 2021 unfortunately). 3 grandchildren.       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       Hobbies: shopping (husband will go with her) for grandkids.    Social Determinants of Health   Financial Resource Strain: Low Risk  (10/23/2021)   Overall Financial Resource Strain (CARDIA)      Difficulty of Paying Living Expenses: Not hard at all  Food Insecurity: No Food Insecurity (10/23/2021)   Hunger Vital Sign    Worried About Running Out of Food in the Last Year: Never true    Ran Out of Food in the Last Year: Never true  Transportation Needs: No Transportation Needs (10/23/2021)   PRAPARE - Transportation    Lack of Transportation (Medical): No    Lack of Transportation (Non-Medical): No  Physical Activity: Insufficiently Active (10/23/2021)   Exercise Vital Sign    Days of Exercise per Week: 3 days    Minutes of Exercise per Session: 30 min  Stress: No Stress Concern Present (10/23/2021)   [employer] - Occupational Stress Questionnaire    Feeling of Stress : Not at all  Social Connections: Moderately Integrated (10/23/2021)   Social Connection and Isolation Panel [NHANES]    Frequency of Communication with Friends and Family: More than three times a week    Frequency of Social Gatherings with Friends and Family: More than three times a week    Attends  Religious Services: More than 4 times per year    Active Member of Clubs or Organizations: No    Attends Club or Organization Meetings: Never    Marital Status: Married  Intimate Partner Violence: Not At Risk (10/23/2021)   Humiliation, Afraid, Rape, and Kick questionnaire    Fear of Current or Ex-Partner: No    Emotionally Abused: No    Physically Abused: No    Sexually Abused: No     Allergies  Allergen Reactions   Codeine     REACTION: nausea   Penicillins     REACTION: rash     Outpatient Medications Prior to Visit  Medication Sig Dispense Refill   acetaminophen (TYLENOL) 500 MG tablet Take 500 mg by mouth every 6 (six) hours as needed.     albuterol (PROAIR HFA) 108 (90 Base) MCG/ACT inhaler Inhale 1-2 puffs into the lungs every 6 (six) hours as needed for wheezing or shortness of breath. Reported on 02/21/2016 18 g 5   atorvastatin (LIPITOR) 80 MG tablet Take 1 tablet (80 mg total) by mouth daily. 90 tablet 3   Calcium-Magnesium-Vitamin D 600-40-500 MG-MG-UNIT TB24 Take by mouth.     Coenzyme Q10 (CO Q 10 PO) Take 1 capsule by mouth daily.     meloxicam (MOBIC) 15 MG tablet Take 1 tablet (15 mg total) by mouth daily as needed. 90 tablet 3   Tiotropium Bromide Monohydrate (SPIRIVA RESPIMAT) 1.25 MCG/ACT AERS Inhale 2 puffs into the lungs daily. 4 g 3   Tiotropium Bromide Monohydrate (SPIRIVA RESPIMAT) 1.25 MCG/ACT AERS Inhale 2 puffs into the lungs daily. 2 each 0   Tiotropium Bromide-Olodaterol (STIOLTO RESPIMAT) 2.5-2.5 MCG/ACT AERS Inhale 2 puffs into the lungs daily. Pt needs appt for further refills. 4 g 11   No facility-administered medications prior to visit.    Review of Systems  Constitutional:  Negative for chills, fever, malaise/fatigue and weight loss.  HENT:  Negative for hearing loss, sore throat and tinnitus.   Eyes:  Negative for blurred vision and double vision.  Respiratory:  Negative for cough, hemoptysis, sputum production, shortness of breath, wheezing and  stridor.   Cardiovascular:  Negative for chest pain, palpitations, orthopnea, leg swelling and PND.  Gastrointestinal:  Negative for abdominal pain, constipation, diarrhea, heartburn, nausea and vomiting.  Genitourinary:  Negative for dysuria, hematuria and urgency.  Musculoskeletal:  Negative for joint pain and myalgias.    Skin:  Negative for itching and rash.  Neurological:  Negative for dizziness, tingling, weakness and headaches.  Endo/Heme/Allergies:  Negative for environmental allergies. Does not bruise/bleed easily.  Psychiatric/Behavioral:  Negative for depression. The patient is not nervous/anxious and does not have insomnia.   All other systems reviewed and are negative.    Objective:  Physical Exam Vitals reviewed.  Constitutional:      General: She is not in acute distress.    Appearance: She is well-developed.  HENT:     Head: Normocephalic and atraumatic.  Eyes:     General: No scleral icterus.    Conjunctiva/sclera: Conjunctivae normal.     Pupils: Pupils are equal, round, and reactive to light.  Neck:     Vascular: No JVD.     Trachea: No tracheal deviation.  Cardiovascular:     Rate and Rhythm: Normal rate and regular rhythm.     Heart sounds: Normal heart sounds. No murmur heard. Pulmonary:     Effort: Pulmonary effort is normal. No tachypnea, accessory muscle usage or respiratory distress.     Breath sounds: No stridor. No wheezing, rhonchi or rales.  Abdominal:     General: There is no distension.     Palpations: Abdomen is soft.     Tenderness: There is no abdominal tenderness.  Musculoskeletal:        General: No tenderness.     Cervical back: Neck supple.  Lymphadenopathy:     Cervical: No cervical adenopathy.  Skin:    General: Skin is warm and dry.     Capillary Refill: Capillary refill takes less than 2 seconds.     Findings: No rash.  Neurological:     Mental Status: She is alert and oriented to person, place, and time.  Psychiatric:         Behavior: Behavior normal.      Vitals:   01/23/23 0857  BP: 120/84  Pulse: 86  SpO2: 97%  Weight: 139 lb (63 kg)  Height: 5' 3" (1.6 m)   97% on RA BMI Readings from Last 3 Encounters:  01/23/23 24.62 kg/m  12/03/22 24.52 kg/m  09/05/22 24.27 kg/m   Wt Readings from Last 3 Encounters:  01/23/23 139 lb (63 kg)  12/03/22 138 lb 6.4 oz (62.8 kg)  09/05/22 137 lb (62.1 kg)     CBC    Component Value Date/Time   WBC 6.2 09/05/2022 0939   RBC 5.06 09/05/2022 0939   HGB 14.6 09/05/2022 0939   HGB 14.3 01/10/2009 0919   HCT 42.0 09/05/2022 0939   HCT 41.5 01/10/2009 0919   PLT 292.0 09/05/2022 0939   PLT 315 01/10/2009 0919   MCV 82.9 09/05/2022 0939   MCV 85.6 01/10/2009 0919   MCH 29.4 01/10/2009 0919   MCHC 34.7 09/05/2022 0939   RDW 13.8 09/05/2022 0939   RDW 13.1 01/10/2009 0919   LYMPHSABS 1.8 09/05/2022 0939   LYMPHSABS 1.7 01/10/2009 0919   MONOABS 0.4 09/05/2022 0939   MONOABS 0.4 01/10/2009 0919   EOSABS 0.1 09/05/2022 0939   EOSABS 0.1 01/10/2009 0919   BASOSABS 0.0 09/05/2022 0939   BASOSABS 0.0 01/10/2009 0919     Chest Imaging:  May 2024 lung cancer screening nodule follow-up: CT imaging with bilateral scattered groundglass opacities largest of which is in the left upper lobe that has nearly doubled in size.  It is 12.4 mm part solid in the left lower lobe that is now developing 3.4 mm solid component.  Other   areas within the lung without any significant solid components. The patient's images have been independently reviewed by me.    Pulmonary Functions Testing Results:     No data to display          FeNO:   Pathology:   Echocardiogram:   Heart Catheterization:     Assessment & Plan:     ICD-10-CM   1. Lung nodule  R91.1 Procedural/ Surgical Case Request: ROBOTIC ASSISTED NAVIGATIONAL BRONCHOSCOPY    Ambulatory referral to Pulmonology    CT Super D Chest Wo Contrast    2. Multiple lung nodules  R91.8     3. Ground glass  opacity present on imaging of lung  R91.8       Discussion:  This is a 72-year-old female, past medical history of tobacco use, history of squamous cell carcinoma of the left upper lobe now with lung cancer screening abnormality on CT imaging.  Found to have a groundglass opacity in the left lower lobe that had slowly enlarged over time now developing a more solid component. Also has other groundglass lesions concerning for potential underlying diagnosis of multifocal adenocarcinoma.  Plan: Today in the office with about the risk benefits alternatives consideration of bronchoscopy biopsy. We talked with the risk of bleeding and pneumothorax. Patient is agreeable proceed with bronchoscopy. We have selected a date for June 24. Patient on any blood thinners or antiplatelets. Will need super D CT imaging prior. She is on vacation the week prior to that and will not be able to have imaging done prior that week would need to get done the week before. I relayed this message to our schedulers.  We appreciate PCC's help with scheduling.     Current Outpatient Medications:    acetaminophen (TYLENOL) 500 MG tablet, Take 500 mg by mouth every 6 (six) hours as needed., Disp: , Rfl:    albuterol (PROAIR HFA) 108 (90 Base) MCG/ACT inhaler, Inhale 1-2 puffs into the lungs every 6 (six) hours as needed for wheezing or shortness of breath. Reported on 02/21/2016, Disp: 18 g, Rfl: 5   atorvastatin (LIPITOR) 80 MG tablet, Take 1 tablet (80 mg total) by mouth daily., Disp: 90 tablet, Rfl: 3   Calcium-Magnesium-Vitamin D 600-40-500 MG-MG-UNIT TB24, Take by mouth., Disp: , Rfl:    Coenzyme Q10 (CO Q 10 PO), Take 1 capsule by mouth daily., Disp: , Rfl:    meloxicam (MOBIC) 15 MG tablet, Take 1 tablet (15 mg total) by mouth daily as needed., Disp: 90 tablet, Rfl: 3   Tiotropium Bromide Monohydrate (SPIRIVA RESPIMAT) 1.25 MCG/ACT AERS, Inhale 2 puffs into the lungs daily., Disp: 4 g, Rfl: 3  I spent 62 minutes  dedicated to the care of this patient on the date of this encounter to include pre-visit review of records, face-to-face time with the patient discussing conditions above, post visit ordering of testing, clinical documentation with the electronic health record, making appropriate referrals as documented, and communicating necessary findings to members of the patients care team.   Shem Plemmons L Sarahjane Matherly, DO [facility] 01/23/2023 9:36 AM    

## 2023-01-27 ENCOUNTER — Encounter: Payer: Self-pay | Admitting: Internal Medicine

## 2023-01-28 MED ORDER — ALBUTEROL SULFATE HFA 108 (90 BASE) MCG/ACT IN AERS: 1.0000 | INHALATION_SPRAY | Freq: Four times a day (QID) | RESPIRATORY_TRACT | 5 refills | Status: DC | PRN

## 2023-02-01 ENCOUNTER — Ambulatory Visit (HOSPITAL_BASED_OUTPATIENT_CLINIC_OR_DEPARTMENT_OTHER): Payer: Medicare PPO

## 2023-02-06 ENCOUNTER — Encounter (HOSPITAL_COMMUNITY): Payer: Self-pay | Admitting: Emergency Medicine

## 2023-02-06 ENCOUNTER — Other Ambulatory Visit: Payer: Self-pay

## 2023-02-06 NOTE — Progress Notes (Addendum)
SDW call  Patient was given pre-op instructions over the phone. Patient verbalized understanding of instructions provided.    PCP - Dr. Tana Conch Cardiologist - denies Pulmonary: Dr. Kalman Shan   PPM/ICD - denies  Chest x-ray - n/a EKG -  01/28/2022 Stress Test - ECHO -  Cardiac Cath -   Sleep Study/sleep apnea/CPAP: denies  Non-diabetic   Blood Thinner Instructions: denies Aspirin Instructions:denies   ERAS Protcol - no, NPO PRE-SURGERY Ensure or G2-    COVID TEST- n/a    Anesthesia review: No   Patient denies shortness of breath, fever, cough and chest pain over the phone call  Your procedure is scheduled on Monday February 10, 2023   Report to [facility] Main Entrance "A" at 0700 A.M., then check in with the Admitting office.  Call this number if you have problems the morning of surgery:  [phone]   If you have any questions prior to your surgery date call [phone]: Open Monday-Friday 8am-4pm If you experience any cold or flu symptoms such as cough, fever, chills, shortness of breath, etc. between now and your scheduled surgery, please notify us at the above number    Remember:  Do not eat or drink after midnight the night before your surgery   Take these medicines the morning of surgery with A SIP OF WATER:  Atorvastatin, spiriva  As needed: Tylenol, albuterol  As of today, STOP taking any Aspirin (unless otherwise instructed by your surgeon) Aleve, Naproxen, Ibuprofen, Motrin, Advil, Goody's, BC's, all herbal medications, fish oil, and all vitamins. Stop Meloxicam

## 2023-02-09 NOTE — Anesthesia Preprocedure Evaluation (Addendum)
Anesthesia Evaluation  Patient identified by MRN, date of birth, ID band Patient awake    Reviewed: Allergy & Precautions, NPO status , Patient's Chart, lab work & pertinent test results  History of Anesthesia Complications (+) history of anesthetic complications  Airway Mallampati: II  TM Distance: >3 FB Neck ROM: Full    Dental  (+) Edentulous Upper, Partial Lower, Dental Advisory Given, Missing   Pulmonary COPD, former smoker   Pulmonary exam normal breath sounds clear to auscultation       Cardiovascular hypertension, + CAD  Normal cardiovascular exam Rhythm:Regular Rate:Normal     Neuro/Psych  PSYCHIATRIC DISORDERS  Depression    negative neurological ROS     GI/Hepatic negative GI ROS, Neg liver ROS,,,  Endo/Other  negative endocrine ROS    Renal/GU negative Renal ROS     Musculoskeletal negative musculoskeletal ROS (+)    Abdominal  (+) - obese  Peds  Hematology negative hematology ROS (+)   Anesthesia Other Findings   Reproductive/Obstetrics                             Anesthesia Physical Anesthesia Plan  ASA: 3  Anesthesia Plan: General   Post-op Pain Management: Tylenol PO (pre-op)*   Induction: Intravenous  PONV Risk Score and Plan: 3 and Ondansetron, Dexamethasone, Propofol infusion, TIVA and Treatment may vary due to age or medical condition  Airway Management Planned: Oral ETT  Additional Equipment: None  Intra-op Plan:   Post-operative Plan: Extubation in OR  Informed Consent: I have reviewed the patients History and Physical, chart, labs and discussed the procedure including the risks, benefits and alternatives for the proposed anesthesia with the patient or authorized representative who has indicated his/her understanding and acceptance.     Dental advisory given  Plan Discussed with: CRNA  Anesthesia Plan Comments:        Anesthesia Quick  Evaluation

## 2023-02-10 ENCOUNTER — Ambulatory Visit (HOSPITAL_COMMUNITY): Payer: Medicare PPO

## 2023-02-10 ENCOUNTER — Inpatient Hospital Stay (HOSPITAL_COMMUNITY): Payer: Medicare PPO

## 2023-02-10 ENCOUNTER — Ambulatory Visit (HOSPITAL_COMMUNITY): Payer: Medicare PPO | Admitting: Anesthesiology

## 2023-02-10 ENCOUNTER — Inpatient Hospital Stay (HOSPITAL_COMMUNITY)
Admission: RE | Admit: 2023-02-10 | Discharge: 2023-02-11 | DRG: 987 | Disposition: A | Discharge status: Other Acute Inpatient Hospital | Payer: Medicare PPO | Attending: Internal Medicine | Admitting: Internal Medicine

## 2023-02-10 ENCOUNTER — Encounter (HOSPITAL_COMMUNITY)
Admission: RE | Disposition: A | Discharge status: Other Acute Inpatient Hospital | Payer: Self-pay | Source: Home / Self Care | Attending: Internal Medicine

## 2023-02-10 ENCOUNTER — Encounter (HOSPITAL_COMMUNITY): Payer: Self-pay | Admitting: Emergency Medicine

## 2023-02-10 DIAGNOSIS — Z823 Family history of stroke: Secondary | ICD-10-CM | POA: Diagnosis not present

## 2023-02-10 DIAGNOSIS — Z79899 Other long term (current) drug therapy: Secondary | ICD-10-CM

## 2023-02-10 DIAGNOSIS — I952 Hypotension due to drugs: Secondary | ICD-10-CM | POA: Diagnosis not present

## 2023-02-10 DIAGNOSIS — R569 Unspecified convulsions: Secondary | ICD-10-CM | POA: Diagnosis not present

## 2023-02-10 DIAGNOSIS — R918 Other nonspecific abnormal finding of lung field: Secondary | ICD-10-CM | POA: Diagnosis not present

## 2023-02-10 DIAGNOSIS — M810 Age-related osteoporosis without current pathological fracture: Secondary | ICD-10-CM | POA: Diagnosis present

## 2023-02-10 DIAGNOSIS — I6349 Cerebral infarction due to embolism of other cerebral artery: Secondary | ICD-10-CM | POA: Diagnosis not present

## 2023-02-10 DIAGNOSIS — Z820 Family history of epilepsy and other diseases of the nervous system: Secondary | ICD-10-CM

## 2023-02-10 DIAGNOSIS — Z462 Encounter for fitting and adjustment of other devices related to nervous system and special senses: Secondary | ICD-10-CM | POA: Diagnosis not present

## 2023-02-10 DIAGNOSIS — Z87891 Personal history of nicotine dependence: Secondary | ICD-10-CM

## 2023-02-10 DIAGNOSIS — T81718A Complication of other artery following a procedure, not elsewhere classified, initial encounter: Secondary | ICD-10-CM | POA: Diagnosis not present

## 2023-02-10 DIAGNOSIS — I2510 Atherosclerotic heart disease of native coronary artery without angina pectoris: Secondary | ICD-10-CM | POA: Diagnosis not present

## 2023-02-10 DIAGNOSIS — R4182 Altered mental status, unspecified: Secondary | ICD-10-CM | POA: Diagnosis not present

## 2023-02-10 DIAGNOSIS — C3412 Malignant neoplasm of upper lobe, left bronchus or lung: Secondary | ICD-10-CM | POA: Diagnosis present

## 2023-02-10 DIAGNOSIS — R911 Solitary pulmonary nodule: Secondary | ICD-10-CM | POA: Diagnosis not present

## 2023-02-10 DIAGNOSIS — C3432 Malignant neoplasm of lower lobe, left bronchus or lung: Secondary | ICD-10-CM | POA: Diagnosis not present

## 2023-02-10 DIAGNOSIS — R29729 NIHSS score 29: Secondary | ICD-10-CM | POA: Diagnosis present

## 2023-02-10 DIAGNOSIS — G9340 Encephalopathy, unspecified: Secondary | ICD-10-CM | POA: Diagnosis present

## 2023-02-10 DIAGNOSIS — Z902 Acquired absence of lung [part of]: Secondary | ICD-10-CM

## 2023-02-10 DIAGNOSIS — Z885 Allergy status to narcotic agent status: Secondary | ICD-10-CM | POA: Diagnosis not present

## 2023-02-10 DIAGNOSIS — Z880 Allergy status to penicillin: Secondary | ICD-10-CM | POA: Diagnosis not present

## 2023-02-10 DIAGNOSIS — I639 Cerebral infarction, unspecified: Secondary | ICD-10-CM

## 2023-02-10 DIAGNOSIS — Z801 Family history of malignant neoplasm of trachea, bronchus and lung: Secondary | ICD-10-CM | POA: Diagnosis not present

## 2023-02-10 DIAGNOSIS — T790XXA Air embolism (traumatic), initial encounter: Secondary | ICD-10-CM | POA: Diagnosis not present

## 2023-02-10 DIAGNOSIS — I959 Hypotension, unspecified: Secondary | ICD-10-CM | POA: Diagnosis not present

## 2023-02-10 DIAGNOSIS — J9811 Atelectasis: Secondary | ICD-10-CM | POA: Diagnosis not present

## 2023-02-10 DIAGNOSIS — G936 Cerebral edema: Secondary | ICD-10-CM | POA: Diagnosis not present

## 2023-02-10 DIAGNOSIS — J9601 Acute respiratory failure with hypoxia: Secondary | ICD-10-CM

## 2023-02-10 DIAGNOSIS — J9602 Acute respiratory failure with hypercapnia: Secondary | ICD-10-CM | POA: Diagnosis not present

## 2023-02-10 DIAGNOSIS — I10 Essential (primary) hypertension: Secondary | ICD-10-CM | POA: Diagnosis present

## 2023-02-10 DIAGNOSIS — Z66 Do not resuscitate: Secondary | ICD-10-CM | POA: Diagnosis not present

## 2023-02-10 DIAGNOSIS — J449 Chronic obstructive pulmonary disease, unspecified: Secondary | ICD-10-CM | POA: Diagnosis present

## 2023-02-10 DIAGNOSIS — R402A Nontraumatic coma due to underlying condition: Secondary | ICD-10-CM | POA: Diagnosis not present

## 2023-02-10 DIAGNOSIS — I6340 Cerebral infarction due to embolism of unspecified cerebral artery: Secondary | ICD-10-CM | POA: Diagnosis not present

## 2023-02-10 DIAGNOSIS — G9349 Other encephalopathy: Secondary | ICD-10-CM | POA: Diagnosis not present

## 2023-02-10 DIAGNOSIS — E232 Diabetes insipidus: Secondary | ICD-10-CM | POA: Diagnosis not present

## 2023-02-10 DIAGNOSIS — I472 Ventricular tachycardia, unspecified: Secondary | ICD-10-CM | POA: Diagnosis not present

## 2023-02-10 DIAGNOSIS — E785 Hyperlipidemia, unspecified: Secondary | ICD-10-CM | POA: Diagnosis present

## 2023-02-10 DIAGNOSIS — Z9911 Dependence on respirator [ventilator] status: Secondary | ICD-10-CM | POA: Diagnosis not present

## 2023-02-10 DIAGNOSIS — I700 Atherosclerosis of aorta: Secondary | ICD-10-CM | POA: Diagnosis not present

## 2023-02-10 DIAGNOSIS — G9389 Other specified disorders of brain: Secondary | ICD-10-CM | POA: Diagnosis not present

## 2023-02-10 DIAGNOSIS — Z515 Encounter for palliative care: Secondary | ICD-10-CM | POA: Diagnosis not present

## 2023-02-10 DIAGNOSIS — G935 Compression of brain: Secondary | ICD-10-CM | POA: Diagnosis not present

## 2023-02-10 DIAGNOSIS — Z8249 Family history of ischemic heart disease and other diseases of the circulatory system: Secondary | ICD-10-CM

## 2023-02-10 HISTORY — PX: BRONCHIAL NEEDLE ASPIRATION BIOPSY: SHX5106

## 2023-02-10 HISTORY — PX: BRONCHIAL BIOPSY: SHX5109

## 2023-02-10 HISTORY — PX: BRONCHIAL BRUSHINGS: SHX5108

## 2023-02-10 LAB — GLUCOSE, CAPILLARY
Glucose-Capillary: 173 mg/dL — ABNORMAL HIGH (ref 70–99)
Glucose-Capillary: 219 mg/dL — ABNORMAL HIGH (ref 70–99)
Glucose-Capillary: 146 mg/dL — ABNORMAL HIGH (ref 70–99)
Glucose-Capillary: 160 mg/dL — ABNORMAL HIGH (ref 70–99)
Glucose-Capillary: 147 mg/dL — ABNORMAL HIGH (ref 70–99)

## 2023-02-10 LAB — HEMOGLOBIN A1C
Hgb A1c MFr Bld: 5.1 % (ref 4.8–5.6)
Mean Plasma Glucose: 99.67 mg/dL

## 2023-02-10 LAB — POCT I-STAT 7, (LYTES, BLD GAS, ICA,H+H)
Acid-base deficit: 4 mmol/L — ABNORMAL HIGH (ref 0.0–2.0)
Bicarbonate: 21.4 mmol/L (ref 20.0–28.0)
Calcium, Ion: 1.09 mmol/L — ABNORMAL LOW (ref 1.15–1.40)
HCT: 41 % (ref 36.0–46.0)
Hemoglobin: 13.9 g/dL (ref 12.0–15.0)
O2 Saturation: 99 %
Patient temperature: 97.9
Potassium: 3 mmol/L — ABNORMAL LOW (ref 3.5–5.1)
Sodium: 139 mmol/L (ref 135–145)
TCO2: 23 mmol/L (ref 22–32)
pCO2 arterial: 37.9 mmHg (ref 32–48)
pH, Arterial: 7.359 (ref 7.35–7.45)
pO2, Arterial: 163 mmHg — ABNORMAL HIGH (ref 83–108)

## 2023-02-10 LAB — CBC
HCT: 42.6 % (ref 36.0–46.0)
Hemoglobin: 14.2 g/dL (ref 12.0–15.0)
MCH: 27.8 pg (ref 26.0–34.0)
MCHC: 33.3 g/dL (ref 30.0–36.0)
MCV: 83.5 fL (ref 80.0–100.0)
Platelets: 249 10*3/uL (ref 150–400)
RBC: 5.1 MIL/uL (ref 3.87–5.11)
RDW: 13 % (ref 11.5–15.5)
WBC: 6.1 10*3/uL (ref 4.0–10.5)
nRBC: 0 % (ref 0.0–0.2)

## 2023-02-10 LAB — BASIC METABOLIC PANEL
Anion gap: 9 (ref 5–15)
BUN: 12 mg/dL (ref 8–23)
CO2: 20 mmol/L — ABNORMAL LOW (ref 22–32)
Calcium: 8.8 mg/dL — ABNORMAL LOW (ref 8.9–10.3)
Chloride: 108 mmol/L (ref 98–111)
Creatinine, Ser: 0.85 mg/dL (ref 0.44–1.00)
GFR, Estimated: >60 mL/min (ref >60)
Glucose, Bld: 104 mg/dL — ABNORMAL HIGH (ref 70–99)
Potassium: 3.9 mmol/L (ref 3.5–5.1)
Sodium: 137 mmol/L (ref 135–145)

## 2023-02-10 SURGERY — BRONCHOSCOPY, WITH BIOPSY USING ELECTROMAGNETIC NAVIGATION
Anesthesia: General | Laterality: Bilateral

## 2023-02-10 MED ORDER — PROPOFOL 1000 MG/100ML IV EMUL: 0.0000 ug/kg/min | INTRAVENOUS | Status: DC

## 2023-02-10 MED ORDER — NALOXONE HCL 0.4 MG/ML IJ SOLN
0.4000 mg | INTRAMUSCULAR | Status: DC | PRN
  Administered 2023-02-10: 0.4 mg via INTRAVENOUS

## 2023-02-10 MED ORDER — ORAL CARE MOUTH RINSE: 15.0000 mL | OROMUCOSAL | 0 refills | Status: DC | PRN

## 2023-02-10 MED ORDER — FENTANYL CITRATE PF 50 MCG/ML IJ SOSY: 25.0000 ug | PREFILLED_SYRINGE | INTRAMUSCULAR | Status: DC | PRN

## 2023-02-10 MED ORDER — PROPOFOL 10 MG/ML IV BOLUS
INTRAVENOUS | Status: AC
  Filled 2023-02-10: qty 20

## 2023-02-10 MED ORDER — ORAL CARE MOUTH RINSE
15.0000 mL | OROMUCOSAL | Status: DC
  Administered 2023-02-10 – 2023-02-11 (×4): 15 mL via OROMUCOSAL

## 2023-02-10 MED ORDER — STROKE: EARLY STAGES OF RECOVERY BOOK: Freq: Once | Status: DC

## 2023-02-10 MED ORDER — POLYETHYLENE GLYCOL 3350 17 G PO PACK: 17.0000 g | PACK | Freq: Every day | ORAL | Status: DC | PRN

## 2023-02-10 MED ORDER — PANTOPRAZOLE SODIUM 40 MG IV SOLR
40.0000 mg | Freq: Every day | INTRAVENOUS | Status: DC
  Administered 2023-02-10: 40 mg via INTRAVENOUS
  Filled 2023-02-10: qty 10

## 2023-02-10 MED ORDER — ORAL CARE MOUTH RINSE: 15.0000 mL | OROMUCOSAL | 0 refills | Status: DC

## 2023-02-10 MED ORDER — LIDOCAINE 2% (20 MG/ML) 5 ML SYRINGE
INTRAMUSCULAR | Status: DC | PRN
  Administered 2023-02-10: 50 mg via INTRAVENOUS

## 2023-02-10 MED ORDER — PHENYLEPHRINE 80 MCG/ML (10ML) SYRINGE FOR IV PUSH (FOR BLOOD PRESSURE SUPPORT)
PREFILLED_SYRINGE | INTRAVENOUS | Status: DC | PRN
  Administered 2023-02-10: 80 ug via INTRAVENOUS
  Administered 2023-02-10: 40 ug via INTRAVENOUS
  Administered 2023-02-10 (×3): 80 ug via INTRAVENOUS

## 2023-02-10 MED ORDER — MEPERIDINE HCL 25 MG/ML IJ SOLN: 6.2500 mg | INTRAMUSCULAR | Status: DC | PRN

## 2023-02-10 MED ORDER — FENTANYL CITRATE PF 50 MCG/ML IJ SOSY: 25.0000 ug | PREFILLED_SYRINGE | INTRAMUSCULAR | 0 refills | Status: DC | PRN

## 2023-02-10 MED ORDER — SODIUM CHLORIDE 0.9 % IV SOLN
50.0000 mL | Freq: Once | INTRAVENOUS | Status: AC
  Administered 2023-02-10: 50 mL via INTRAVENOUS

## 2023-02-10 MED ORDER — ALTEPLASE (STROKE) FULL DOSE INFUSION
0.9000 mg/kg | Freq: Once | INTRAVENOUS | Status: AC
  Administered 2023-02-10: 50.7 mg via INTRAVENOUS
  Filled 2023-02-10: qty 100

## 2023-02-10 MED ORDER — PROPOFOL 500 MG/50ML IV EMUL
INTRAVENOUS | Status: DC | PRN
  Administered 2023-02-10: 125 ug/kg/min via INTRAVENOUS

## 2023-02-10 MED ORDER — FENTANYL CITRATE PF 50 MCG/ML IJ SOSY
25.0000 ug | PREFILLED_SYRINGE | INTRAMUSCULAR | Status: DC | PRN
  Administered 2023-02-10 (×2): 25 ug via INTRAVENOUS
  Filled 2023-02-10 (×2): qty 1

## 2023-02-10 MED ORDER — PROPOFOL 1000 MG/100ML IV EMUL
INTRAVENOUS | Status: AC
  Filled 2023-02-10: qty 100

## 2023-02-10 MED ORDER — CLEVIDIPINE BUTYRATE 0.5 MG/ML IV EMUL: 0.0000 mg/h | INTRAVENOUS | Status: DC

## 2023-02-10 MED ORDER — ACETAMINOPHEN 500 MG PO TABS
1000.0000 mg | ORAL_TABLET | Freq: Once | ORAL | Status: AC
  Administered 2023-02-10: 1000 mg via ORAL
  Filled 2023-02-10: qty 2

## 2023-02-10 MED ORDER — PANTOPRAZOLE SODIUM 40 MG IV SOLR: 40.0000 mg | Freq: Every day | INTRAVENOUS | Status: DC

## 2023-02-10 MED ORDER — EPHEDRINE SULFATE-NACL 50-0.9 MG/10ML-% IV SOSY
PREFILLED_SYRINGE | INTRAVENOUS | Status: DC | PRN
  Administered 2023-02-10: 10 mg via INTRAVENOUS

## 2023-02-10 MED ORDER — LABETALOL HCL 5 MG/ML IV SOLN: 20.0000 mg | Freq: Once | INTRAVENOUS | Status: AC

## 2023-02-10 MED ORDER — POLYETHYLENE GLYCOL 3350 17 G PO PACK: 17.0000 g | PACK | Freq: Every day | ORAL | Status: DC

## 2023-02-10 MED ORDER — FENTANYL CITRATE (PF) 100 MCG/2ML IJ SOLN: 25.0000 ug | INTRAMUSCULAR | Status: DC | PRN

## 2023-02-10 MED ORDER — CHLORHEXIDINE GLUCONATE CLOTH 2 % EX PADS
6.0000 | MEDICATED_PAD | Freq: Every day | CUTANEOUS | Status: DC
  Administered 2023-02-10: 6 via TOPICAL

## 2023-02-10 MED ORDER — ORAL CARE MOUTH RINSE: 15.0000 mL | OROMUCOSAL | Status: DC | PRN

## 2023-02-10 MED ORDER — ENOXAPARIN SODIUM 40 MG/0.4ML IJ SOSY: 40.0000 mg | PREFILLED_SYRINGE | INTRAMUSCULAR | Status: DC

## 2023-02-10 MED ORDER — DOCUSATE SODIUM 50 MG/5ML PO LIQD: 100.0000 mg | Freq: Two times a day (BID) | ORAL | 0 refills | Status: DC

## 2023-02-10 MED ORDER — DOCUSATE SODIUM 100 MG PO CAPS: 100.0000 mg | ORAL_CAPSULE | Freq: Two times a day (BID) | ORAL | 0 refills | Status: DC | PRN

## 2023-02-10 MED ORDER — SUCCINYLCHOLINE CHLORIDE 200 MG/10ML IV SOSY
PREFILLED_SYRINGE | INTRAVENOUS | Status: DC | PRN
  Administered 2023-02-10: 100 mg via INTRAVENOUS

## 2023-02-10 MED ORDER — SUGAMMADEX SODIUM 200 MG/2ML IV SOLN
INTRAVENOUS | Status: DC | PRN
  Administered 2023-02-10: 200 mg via INTRAVENOUS

## 2023-02-10 MED ORDER — UMECLIDINIUM BROMIDE 62.5 MCG/ACT IN AEPB
1.0000 | INHALATION_SPRAY | Freq: Every day | RESPIRATORY_TRACT | Status: DC
  Filled 2023-02-10: qty 7

## 2023-02-10 MED ORDER — FENTANYL CITRATE (PF) 100 MCG/2ML IJ SOLN
INTRAMUSCULAR | Status: DC | PRN
  Administered 2023-02-10 (×2): 50 ug via INTRAVENOUS

## 2023-02-10 MED ORDER — SODIUM CHLORIDE 0.9 % IV SOLN: INTRAVENOUS | Status: DC

## 2023-02-10 MED ORDER — SUGAMMADEX SODIUM 200 MG/2ML IV SOLN: INTRAVENOUS | Status: DC | PRN

## 2023-02-10 MED ORDER — NALOXONE HCL 0.4 MG/ML IJ SOLN
0.4000 mg | INTRAMUSCULAR | Status: DC | PRN
  Administered 2023-02-10 (×2): 0.08 mg via INTRAVENOUS

## 2023-02-10 MED ORDER — ESMOLOL HCL 100 MG/10ML IV SOLN
INTRAVENOUS | Status: DC | PRN
  Administered 2023-02-10: 20 mg via INTRAVENOUS
  Administered 2023-02-10: 30 mg via INTRAVENOUS

## 2023-02-10 MED ORDER — ACETAMINOPHEN 650 MG RE SUPP
650.0000 mg | Freq: Four times a day (QID) | RECTAL | Status: DC | PRN
  Administered 2023-02-11: 650 mg via RECTAL
  Filled 2023-02-10: qty 1

## 2023-02-10 MED ORDER — DOCUSATE SODIUM 50 MG/5ML PO LIQD
100.0000 mg | Freq: Two times a day (BID) | ORAL | Status: DC
  Filled 2023-02-10: qty 10

## 2023-02-10 MED ORDER — CHLORHEXIDINE GLUCONATE 0.12 % MT SOLN
OROMUCOSAL | Status: AC
  Administered 2023-02-10: 15 mL
  Filled 2023-02-10: qty 15

## 2023-02-10 MED ORDER — POLYETHYLENE GLYCOL 3350 17 G PO PACK: 17.0000 g | PACK | Freq: Every day | ORAL | 0 refills | Status: DC | PRN

## 2023-02-10 MED ORDER — ONDANSETRON HCL 4 MG/2ML IJ SOLN
INTRAMUSCULAR | Status: DC | PRN
  Administered 2023-02-10: 4 mg via INTRAVENOUS

## 2023-02-10 MED ORDER — PROPOFOL 10 MG/ML IV BOLUS
INTRAVENOUS | Status: DC | PRN
  Administered 2023-02-10: 100 mg via INTRAVENOUS
  Administered 2023-02-10: 20 mg via INTRAVENOUS
  Administered 2023-02-10: 40 mg via INTRAVENOUS

## 2023-02-10 MED ORDER — IOHEXOL 350 MG/ML SOLN
75.0000 mL | Freq: Once | INTRAVENOUS | Status: AC | PRN
  Administered 2023-02-10: 75 mL via INTRAVENOUS

## 2023-02-10 MED ORDER — NALOXONE HCL 0.4 MG/ML IJ SOLN
INTRAMUSCULAR | Status: AC
  Filled 2023-02-10: qty 1

## 2023-02-10 MED ORDER — DOCUSATE SODIUM 100 MG PO CAPS: 100.0000 mg | ORAL_CAPSULE | Freq: Two times a day (BID) | ORAL | Status: DC | PRN

## 2023-02-10 MED ORDER — LACTATED RINGERS IV SOLN: INTRAVENOUS | Status: DC

## 2023-02-10 MED ORDER — DEXAMETHASONE SODIUM PHOSPHATE 10 MG/ML IJ SOLN
INTRAMUSCULAR | Status: DC | PRN
  Administered 2023-02-10: 10 mg via INTRAVENOUS

## 2023-02-10 MED ORDER — ACETAMINOPHEN 500 MG PO TABS
500.0000 mg | ORAL_TABLET | Freq: Four times a day (QID) | ORAL | Status: DC | PRN
  Filled 2023-02-10: qty 1

## 2023-02-10 MED ORDER — LABETALOL HCL 5 MG/ML IV SOLN: 20.0000 mg | Freq: Once | INTRAVENOUS | Status: DC

## 2023-02-10 MED ORDER — ATORVASTATIN CALCIUM 80 MG PO TABS: 80.0000 mg | ORAL_TABLET | Freq: Every day | ORAL | Status: DC

## 2023-02-10 MED ORDER — PROPOFOL 1000 MG/100ML IV EMUL
5.0000 ug/kg/min | INTRAVENOUS | Status: DC
  Administered 2023-02-10: 50 ug/kg/min via INTRAVENOUS

## 2023-02-10 MED ORDER — PROPOFOL 1000 MG/100ML IV EMUL
0.0000 ug/kg/min | INTRAVENOUS | Status: DC
  Administered 2023-02-10 (×2): 40 ug/kg/min via INTRAVENOUS
  Administered 2023-02-10 (×2): 15 ug/kg/min via INTRAVENOUS
  Filled 2023-02-10 (×2): qty 100

## 2023-02-10 MED ORDER — POLYETHYLENE GLYCOL 3350 17 G PO PACK: 17.0000 g | PACK | Freq: Every day | ORAL | 0 refills | Status: DC

## 2023-02-10 MED ORDER — ROCURONIUM BROMIDE 10 MG/ML (PF) SYRINGE
PREFILLED_SYRINGE | INTRAVENOUS | Status: DC | PRN
  Administered 2023-02-10: 40 mg via INTRAVENOUS
  Administered 2023-02-10: 10 mg via INTRAVENOUS

## 2023-02-10 MED ORDER — AMISULPRIDE (ANTIEMETIC) 5 MG/2ML IV SOLN: 10.0000 mg | Freq: Once | INTRAVENOUS | Status: DC | PRN

## 2023-02-10 NOTE — Progress Notes (Signed)
Transported from PACU to CT scan without incidence. RNx2, MD, CCCM, Neuro at bedside.

## 2023-02-10 NOTE — Interval H&P Note (Signed)
History and Physical Interval Note:  02/10/2023 7:50 AM  [name]  has presented today for surgery, with the diagnosis of lung nodules.  The various methods of treatment have been discussed with the patient and family. After consideration of risks, benefits and other options for treatment, the patient has consented to  Procedure(s): ROBOTIC ASSISTED NAVIGATIONAL BRONCHOSCOPY (Bilateral) as a surgical intervention.  The patient's history has been reviewed, patient examined, no change in status, stable for surgery.  I have reviewed the patient's chart and labs.  Questions were answered to the patient's satisfaction.     Leslye Peer

## 2023-02-10 NOTE — Op Note (Signed)
Video Bronchoscopy with Robotic Assisted Bronchoscopic Navigation   Date of Operation: 02/10/2023   Pre-op Diagnosis: Enlarging left lower lobe groundglass pulmonary nodule, other groundglass pulmonary nodules right lower lobe and right upper lobe  Post-op Diagnosis: Same  Surgeon: Levy Pupa  Assistants: None  Anesthesia: General endotracheal anesthesia  Operation: Flexible video fiberoptic bronchoscopy with robotic assistance and biopsies.  Estimated Blood Loss: Minimal  Complications: Patient developed arrhythmia during the case while under general anesthesia following breath-hold, initially appeared to be AV block, suspect third-degree block.  Duration approximately 2 minutes with associated relative hypotension.  Recovered to sinus tachycardia with some widening of QRS.  Pulse was never lost.  There was a short run of nonsustained wide-complex tachycardia consistent with VT (8-10 beats) that spontaneously terminated.  Patient recovered to sinus tachycardia with normal QRS, then to NSR.  Indications and History: [name] is a 72 y.o. female with history of tobacco use, squamous cell lung cancer post left upper lobe lobectomy.  She has groundglass pulmonary nodules that have been followed with serial imaging.  Recommendation made to achieve a tissue diagnosis using robotic assisted navigational bronchoscopy. The risks, benefits, complications, treatment options and expected outcomes were discussed with the patient.  The possibilities of pneumothorax, pneumonia, reaction to medication, pulmonary aspiration, perforation of a viscus, bleeding, failure to diagnose a condition and creating a complication requiring transfusion or operation were discussed with the patient who freely signed the consent.    Description of Procedure: The patient was seen in the Preoperative Area, was examined and was deemed appropriate to proceed.  The patient was taken to [facility] endoscopy room 3, identified as  Douglass Rivers and the procedure verified as Flexible Video Fiberoptic Bronchoscopy.  A Time Out was held and the above information confirmed.   Prior to the date of the procedure a high-resolution CT scan of the chest was performed. Utilizing ION software program a virtual tracheobronchial tree was generated to allow the creation of distinct navigation pathways to the patient's parenchymal abnormalities. After being taken to the operating room general anesthesia was initiated and the patient  was orally intubated. The video fiberoptic bronchoscope was introduced via the endotracheal tube and a general inspection was performed which showed normal right lung anatomy.  The left upper lobe airway was surgically absent.  The left lower lobe airways were splayed but otherwise normal.  Aspiration of the bilateral mainstems was completed to remove any remaining secretions. Robotic catheter inserted into patient's endotracheal tube.   Target #1 left upper lobe pulmonary nodule: The distinct navigation pathways prepared prior to this procedure were then utilized to navigate to patient's lesion identified on CT scan. The robotic catheter was secured into place and the vision probe was withdrawn.  Lesion location was approximated using fluoroscopy and local registration and targeting was performed using Cios three-dimensional imaging. Under fluoroscopic guidance transbronchial needle brushings, transbronchial needle biopsies, and transbronchial forceps biopsies were performed to be sent for cytology and pathology.  As mentioned above the patient developed dysrhythmia with breath-hold performed to accomplish the biopsies and brushings described above.  She stabilized as described.  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  Given this information the case was terminated to avoid any further cardiac rhythm disturbance.  Navigation  to the right-sided pulmonary nodules was performed.  At the end of the procedure a general airway inspection was performed and there was no evidence of active bleeding. The bronchoscope was  removed. There was no significant blood loss. A post-procedural chest x-ray is pending.  An EKG will be performed in the PACU to confirm no acute abnormality persists.  Samples Target #1: 1. Transbronchial needle brushings from left lower lobe groundglass nodule 2. Transbronchial Wang needle biopsies from left lower lobe groundglass nodule 3. Transbronchial forceps biopsies from left lower lobe groundglass nodule   Plans:  The patient will be discharged from the PACU to home when recovered from anesthesia and after chest x-ray is reviewed. We will review the cytology, pathology and microbiology results with the patient when they become available. Outpatient followup will be with Dr. Tonia Brooms, Saralyn Pilar.    Levy Pupa, MD, PhD 02/10/2023, 10:09 AM [facility] [phone] or if no answer before 7:00PM call [phone] For any issues after 7:00PM please call eLink [phone]

## 2023-02-10 NOTE — Transfer of Care (Signed)
Immediate Anesthesia Transfer of Care Note  Patient: [name]  Procedure(s) Performed: ROBOTIC ASSISTED NAVIGATIONAL BRONCHOSCOPY (Bilateral) BRONCHIAL NEEDLE ASPIRATION BIOPSIES BRONCHIAL BRUSHINGS BRONCHIAL BIOPSIES  Patient Location: PACU  Anesthesia Type:General  Level of Consciousness: drowsy and responds to stimulation  Airway & Oxygen Therapy: Patient Spontanous Breathing  Post-op Assessment: Report given to RN and Post -op Vital signs reviewed and stable  Post vital signs: Reviewed and stable  Last Vitals:  Vitals Value Taken Time  BP    Temp    Pulse 84 02/10/23 1024  Resp 20 02/10/23 1024  SpO2 95 % 02/10/23 1024  Vitals shown include unvalidated device data.  Last Pain:  Vitals:   02/10/23 0728  TempSrc:   PainSc: 0-No pain         Complications: No notable events documented.

## 2023-02-10 NOTE — Progress Notes (Signed)
Pt transported to and from MRI on the ventilator without incident. 

## 2023-02-10 NOTE — Anesthesia Procedure Notes (Signed)
Procedure Name: Intubation Date/Time: 02/10/2023 9:05 AM  Performed by: Drema Pry, CRNAPre-anesthesia Checklist: Patient identified, Emergency Drugs available, Suction available and Patient being monitored Patient Re-evaluated:Patient Re-evaluated prior to induction Oxygen Delivery Method: Circle System Utilized Preoxygenation: Pre-oxygenation with 100% oxygen Induction Type: IV induction Ventilation: Mask ventilation without difficulty Laryngoscope Size: Mac and 3 Grade View: Grade I Tube type: Oral Tube size: 8.5 mm Number of attempts: 1 Airway Equipment and Method: Stylet Placement Confirmation: ETT inserted through vocal cords under direct vision, positive ETCO2 and breath sounds checked- equal and bilateral Secured at: 19 cm Tube secured with: Tape Dental Injury: Teeth and Oropharynx as per pre-operative assessment

## 2023-02-10 NOTE — Discharge Instructions (Signed)
Flexible Bronchoscopy, Care After This sheet gives you information about how to care for yourself after your test. Your doctor may also give you more specific instructions. If you have problems or questions, contact your doctor. Follow these instructions at home: Eating and drinking When your numbness is gone and your cough and gag reflexes have come back, you may: Eat only soft foods. Slowly drink liquids. The day after the test, go back to your normal diet. Driving Do not drive for 24 hours if you were given a medicine to help you relax (sedative). Do not drive or use heavy machinery while taking prescription pain medicine. General instructions  Take over-the-counter and prescription medicines only as told by your doctor. Return to your normal activities as told. Ask what activities are safe for you. Do not use any products that have nicotine or tobacco in them. This includes cigarettes and e-cigarettes. If you need help quitting, ask your doctor. Keep all follow-up visits as told by your doctor. This is important. It is very important if you had a tissue sample (biopsy) taken. Get help right away if: You have shortness of breath that gets worse. You get light-headed. You feel like you are going to pass out (faint). You have chest pain. You cough up: More than a little blood. More blood than before. Summary Do not eat or drink anything (not even water) for 2 hours after your test, or until your numbing medicine wears off. Do not use cigarettes. Do not use e-cigarettes. Get help right away if you have chest pain.  Please call our office for any questions or concerns.  [phone].  This information is not intended to replace advice given to you by your health care provider. Make sure you discuss any questions you have with your health care provider. Document Released: 06/02/2009 Document Revised: 07/18/2017 Document Reviewed: 08/23/2016 Elsevier Patient Education  2020 Elsevier  Inc.  

## 2023-02-10 NOTE — Code Documentation (Addendum)
Stroke Response Nurse Documentation Code Documentation  [name] is a 72 y.o. 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 On No antithrombotic. Code stroke was activated by PACU.   Patient in PACU where she was LKW at 0905 prior to induction for procedure and now unresponsive with upper extremity posturing following discontinuation of sedation and extubation.   Stroke team at the bedside after patient activation. Patient to CT with team. NIHSS 29, see documentation for details and code stroke times. Patient with decreased LOC, disoriented, not following commands, right gaze preference , bilateral hemianopia, bilateral arm weakness, bilateral leg weakness, Global aphasia , and dysarthria  on exam.   Pt required reintubation for airway protection prior to CT.   The following imaging was completed:  CT Head and CTA.   Patient is a candidate for IV Thrombolytic. Patient is not a candidate for IR due to imaging negative for LVO.   Care/Plan: Q15 x 2 hours, q30 x 6 hours, q1 x 16 hours until 24 hour mark  BP < 180/105   Call Neuro for:  New Headache, worsening symptoms, bleeding, nausea/vomiting, or any signs of angioedema.   Bedside handoff with RN Aggie Cosier.    Raman Featherston L Love Chowning  Rapid Response RN

## 2023-02-10 NOTE — Progress Notes (Signed)
Patient being intubated by anethesia, call stroke called.

## 2023-02-10 NOTE — H&P (Signed)
NAME:  [name], MRN:  [id number], DOB:  1950-10-04, LOS: 0 ADMISSION DATE:  02/10/2023, CONSULTATION DATE:  02/10/23  REFERRING MD:  Dr. 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 Pt has ground glass pulm nodules that have been followed with serial imaging. Recently a recommendations made to achieve a tissue diagnosis using robotic assisted navigational bronchoscopy. Today 02/10/2023 pt arrived to [place] for scheduled flexible video fiberoptic bronchoscopy with robotic assistance and biopsies. Unfortunately post procedure pt was unable to wake up, rapid response was called, pt re- intubated and code stroke called. PCCM was consulted for admission at this time.   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  Significant Hospital Events: Including procedures, antibiotic start and stop dates in addition to other pertinent events   As above in HPI and POC  Interim History / Subjective:    Objective   Blood pressure 112/81, pulse 80, temperature 97.8 F (36.6 C), resp. rate (!) 21, height 5\' 3"  (1.6 m), weight 62.6 kg, SpO2 97 %.        Intake/Output Summary (Last 24 hours) at 02/10/2023 1129 Last data filed at 02/10/2023 1020 Gross per 24 hour  Intake 800 ml  Output 2 ml  Net 798 ml   Filed Weights   02/10/23 0650  Weight: 62.6 kg    Examination:   Physical Exam Constitutional:      Appearance: She is normal weight.  HENT:     Head: Normocephalic.     Right Ear: Tympanic membrane, ear canal and external ear normal.     Left Ear: Tympanic membrane, ear canal and external ear normal.     Nose: Nose normal.     Mouth/Throat:     Comments: intuabed Eyes:     Pupils: Pupils are equal, round, and reactive to light.  Abdominal:     General: Abdomen is flat. Bowel sounds are  normal.     Palpations: Abdomen is soft.  Skin:    General: Skin is warm.     Capillary Refill: Capillary refill takes less than 2 seconds.  Neurological:     Comments: Intubated for airway protection, sedated, not following commands, withdraws to pain      Resolved Hospital Problem list   None  Assessment & Plan:  Squamous Cell lung cancer post left upper lobe lobectomy s/p navigational Bronch 02/10/2023 AMS Post Bronchoscopy, acute Hx of COPD Pt arrived to [place] 6/24 for scheduled bronchoscopy however post procedure was unable to wake up spontaneously. Concern for stroke like symptoms- code stroke called- CT head showed no acute process, multiple foci of air present along midline and left frontal. TPA given by neurology  -ICU admission -CTA head pending -Consult to neurology, appreciate recs -Tele monitoring -Frequent neuro checks -pt will require continued OP f/u with Dr. Delton Coombes   Re- Intubated for Airway protection post bronchoscopy -Maintain full vent support with SAT/SBT as tolerated -titrate Vent setting to maintain SpO2 greater than or equal to 90%. -HOB elevated 30 degrees. -Plateau pressures less than 30 cm H20.  -Follow chest x-ray, ABG prn.   -Bronchial hygiene and RT/bronchodilator protocol.   HLD -continue home statin  Best Practice (right click and "Reselect all SmartList Selections" daily)   Diet/type: NPO DVT prophylaxis: SCD GI  prophylaxis: PPI Lines: N/A Foley:  N/A Code Status:  full code Last date of multidisciplinary goals of care discussion [02/10/23 ]  Labs   CBC: Recent Labs  Lab 02/10/23 0728  WBC 6.1  HGB 14.2  HCT 42.6  MCV 83.5  PLT 249    Basic Metabolic Panel: Recent Labs  Lab 02/10/23 0728  NA 137  K 3.9  CL 108  CO2 20*  GLUCOSE 104*  BUN 12  CREATININE 0.85  CALCIUM 8.8*   GFR: Estimated Creatinine Clearance: 48.8 mL/min (by C-G formula based on SCr of 0.85 mg/dL). Recent Labs  Lab 02/10/23 0728  WBC  6.1    Liver Function Tests: No results for input(s): "AST", "ALT", "ALKPHOS", "BILITOT", "PROT", "ALBUMIN" in the last 168 hours. No results for input(s): "LIPASE", "AMYLASE" in the last 168 hours. No results for input(s): "AMMONIA" in the last 168 hours.  ABG    Component Value Date/Time   PHART 7.367 01/31/2009 0410   PCO2ART 47.1 (H) 01/31/2009 0410   PO2ART 60.9 (L) 01/31/2009 0410   HCO3 26.1 (H) 01/31/2009 0410   TCO2 27.5 01/31/2009 0410   ACIDBASEDEF 0.1 01/26/2009 1339   O2SAT 90.3 01/31/2009 0410     Coagulation Profile: No results for input(s): "INR", "PROTIME" in the last 168 hours.  Cardiac Enzymes: No results for input(s): "CKTOTAL", "CKMB", "CKMBINDEX", "TROPONINI" in the last 168 hours.  HbA1C: Hgb A1c MFr Bld  Date/Time Value Ref Range Status  10/26/2018 09:38 AM 5.5 4.6 - 6.5 % Final    Comment:    Glycemic Control Guidelines for People with Diabetes:Non Diabetic:  <6%Goal of Therapy: <7%Additional Action Suggested:  >8%     CBG: Recent Labs  Lab 02/10/23 1048  GLUCAP 173*    Review of Systems:   Unable to Obtain  Past Medical History:  She,  has a past medical history of Complication of anesthesia, COPD (chronic obstructive pulmonary disease) (HCC), Depression, Hyperlipidemia, Lung cancer (HCC) (2010), and Osteoporosis.   Surgical History:   Past Surgical History:  Procedure Laterality Date   CHOLECYSTECTOMY     COLONOSCOPY  2019   Dr.Gupta   LUNG CANCER SURGERY  2010   Left upper   POLYPECTOMY     VAGINAL HYSTERECTOMY     including ovaries and fallopian tubes     Social History:   reports that she quit smoking about 14 years ago. Her smoking use included cigarettes. She has a 40.00 pack-year smoking history. She has never used smokeless tobacco. She reports that she does not drink alcohol and does not use drugs.   Family History:  Her family history includes CVA in her father; Epilepsy in her mother; Hypertension in her father;  Lung cancer in her brother; Other in her brother. There is no history of Breast cancer, Colon cancer, Esophageal cancer, or Stomach cancer.   Allergies Allergies  Allergen Reactions   Codeine     REACTION: nausea   Penicillins     REACTION: rash     Home Medications  Prior to Admission medications   Medication Sig Start Date End Date Taking? Authorizing Provider  acetaminophen (TYLENOL) 500 MG tablet Take 500 mg by mouth every 6 (six) hours as needed.   Yes [provider]  albuterol (PROAIR HFA) 108 (90 Base) MCG/ACT inhaler Inhale 1-2 puffs into the lungs every 6 (six) hours as needed for wheezing or shortness of breath. Reported on 02/21/2016 01/28/23  Yes Kalman Shan, MD  atorvastatin (LIPITOR) 80 MG tablet  Take 1 tablet (80 mg total) by mouth daily. 09/05/22  Yes Shelva Majestic, MD  Calcium-Magnesium-Vitamin D 600-40-500 MG-MG-UNIT TB24 Take by mouth.   Yes [provider]  Coenzyme Q10 (CO Q 10 PO) Take 1 capsule by mouth daily.   Yes [provider]  meloxicam (MOBIC) 15 MG tablet Take 1 tablet (15 mg total) by mouth daily as needed. 11/26/22  Yes Shelva Majestic, MD  Tiotropium Bromide Monohydrate (SPIRIVA RESPIMAT) 1.25 MCG/ACT AERS Inhale 2 puffs into the lungs daily. 12/03/22   Kalman Shan, MD     Critical care time: 45 minutes     Janeece Riggers, AGACNP-BC  Pulmonary & Critical Care Medicine For pager details, please see AMION or use EPIC chat After 1900, please call [facility] for cross coverage needs 02/10/2023 11:45 AM

## 2023-02-10 NOTE — Progress Notes (Signed)
Upon arrival to PACU patient was reintubated. Placed on mechanical ventilation 8cc/kg.ETT secured 23 cm at lip. Tube holder placed.

## 2023-02-10 NOTE — Research (Signed)
Title: A multi-center, prospective, single-arm, observational study to evaluate real-world outcomes for the shape-sensing Ion endoluminal system  Primary Outcome: Evaluate procedure characteristics and short and long-term patient outcomes following shape-sensing robotic-assisted bronchoscopy (ssRAB) utilizing the Ion Endoluminal System for lung lesion localization or biopsy.   Protocol # / Study Name: ISI-ION-003 Clinical Trials #: [id number] Sponsor: [employer] Principal Investigator: Dr. Elige Radon Icard   Key Features of Ion Endoluminal System (referred to as "Ion") Ion is the first FDA cleared bronchoscopy system that uses fiber optic shape sensing technology to inform on location within the airways. Its catheter/tool channel has a smaller outer diameter (3.5 mm) in comparison to conventional bronchoscopes, allowing it to navigate into the smaller airways of the periphery.     Key Inclusion Criteria Subject is 18 years or older at the time of the procedure Subject is a candidate for a planned, elective RAB lung lesion localization or biopsy procedure in which the Ion Endoluminal System is planned to be utilized.  Subject  able to understand and adhere to study requirements and provide informed consent.   Key Exclusion Criteria Subject is under the care of a Museum/gallery exhibitions officer and is unable to provide informed consent on their own accord.  Subject is participating in an interventional research study or research study with investigational agents with an unknown safety profile that would interfere with participation or the results of this study.  Female subjects who are pregnant or nursing at the time of the index Ion procedure, as determined by standard site practices. Subjects that are incarcerated or institutionalized under court order, or other vulnerable populations.    Previous Clinical Trials Since receiving FDA clearance in Feb 2019, Ion has been adopted  commercially by over 226 centers in the [place], and utilized in over 40,000 procedures.  The first in-human study enrolled 73 subjects with a mean lesion size of 14.8 mm and the overall diagnostic yield was 79.3%, with no adverse events. 17 (58.6%) lesions were reported to have a bronchus sign available on CT imaging.  A multi-center study published results in 2022, with 270 lesions biopsied in 241 patients using Ion. The mean largest cardinal lesion size was 18.86.74mm, and the mean airway generation count was 7.01.6. Asymptomatic pneumothorax occurred in 3.3% of subjects, and 0.8% experienced airway bleeding.   Another study provided preliminary results in 2022, with 87% sensitivity for malignancy, a diagnostic yield of 81%, and a mean lesion size of 16 mm. 75% of biopsy cases were bronchus-sign negative. 4% of subjects experienced pneumothoraces (including those requiring intervention), and 0.8% of subjects experienced airway bleeding requiring wedging or balloon tamponade.  A single-center study captured 131 consecutive procedures of pulmonary biopsy using Ion. The navigational success rate was 98.7%, with an overall diagnostic yield of 81.7%, an overall complication rate of 3%, and a pneumothorax rate of 1.5%.    [facility] Clinical Research Coordinator note:   This visit for Subject [name] with DOB: 06/30/1951 on 02/10/2023 for the above protocol is Visit/Encounter # Pre-procedure, Intra-procedure and Post-procedure  and is for purpose of research.   The consent for this encounter is under:  Protocol Version 1.0 Investigator Brochure Version N/A Consent Version Revision A, dated 14Nov2023 and is currently IRB approved.   [name] expressed continued interest and consent in continuing as a study subject. Subject confirmed that there was no change in contact information (e.g. address, telephone, email). Subject thanked for participation in research and contribution to  science. In  this visit 02/10/2023 the subject will be evaluated by Sub-Investigator named Dr. Delton Coombes. This research coordinator has verified that the above investigator is up to date with his/her training logs.   The Subject was informed that the PI continues to have oversight of the subject's visits and course through relevant discussions, reviews, and also specifically of this visit by routing of this note to the PI.  The research study was discussed with the subject in the pre-operative room. The study was explained in detail including all the contents of the informed consent document. The subject was encouraged to ask questions. All questions were answered to their satisfaction. The IRB approved informed consent was signed, and a copy was given to the subject. After obtaining consent, the subject underwent scheduled procedure using the ion endoluminal system. Data collection was completed per protocol. Refer to paper source subject binder for further details.      Signed by  Verdene Lennert Clinical Research Coordinator / Sub-Investigator  [facility]  [place], [place] 10:24 AM 02/10/2023

## 2023-02-10 NOTE — Progress Notes (Signed)
Transported to 4N19 without incidence.

## 2023-02-10 NOTE — Progress Notes (Signed)
Rt and RN transported vent patient to CT and back to ICU. Vital signs stable through out.

## 2023-02-10 NOTE — Procedures (Signed)
Patient Name: [name]  MRN: [id number]  Epilepsy Attending: Charlsie Quest  Referring Physician/Provider: Rejeana Brock, MD  Date: 02/10/2023 Duration: 26.02 mins  Patient history: 72 y.o. female with past medical history of COPD, hyperlipidemia, squamous cell lung cancer, depression, tobacco use had an elective bronchoscopy with biopsy today.  However postprocedure she was noted to be unresponsive and unable to wake up. EEG to evaluate for seizure.  Level of alertness: comatose  AEDs during EEG study: Propofol  Technical aspects: This EEG study was done with scalp electrodes positioned according to the 10-20 International system of electrode placement. Electrical activity was reviewed with band pass filter of 1-70Hz , sensitivity of 7 uV/mm, display speed of 67mm/sec with a 60Hz  notched filter applied as appropriate. EEG data were recorded continuously and digitally stored.  Video monitoring was available and reviewed as appropriate.  Description: EEG showed continuous generalized background suppression, not reactive to stimulation. Hyperventilation and photic stimulation were not performed.     ABNORMALITY - Background suppression, generalized  IMPRESSION: This study is suggestive of profound diffuse encephalopathy, nonspecific etiology. No seizures or epileptiform discharges were seen throughout the recording.  [name]

## 2023-02-10 NOTE — Consult Note (Addendum)
Neurology Consultation  Reason for Consult: Code stroke Referring Physician: Dr. Delton Coombes  CC: Unresponsive   History is obtained from: MD  HPI: [name] is a 72 y.o. female with past medical history of COPD, hyperlipidemia, squamous cell lung cancer, depression, tobacco use had an elective bronchoscopy with biopsy today.  However postprocedure she was noted to be unresponsive and unable to wake up, Code stroke was called and patient was reintubated in PACU buy anesthesia. LKW 0900 to induction. During the case she was noted to have heart arrhythmias and hypotension.  Code stroke CT head no acute process, multiple foci of air present along midline and left frontal, aspects 10.  NIHSS 29  CT angio head and neck LVO. Delay in obtaining CTA due to no PIV access  She was deemed an appropriate candidate for thrombolytic and was given TPA due to the fact she had just had lung biopsy done.  Risks, benefits, alternatives of IV thrombolysis were discussed and husband via phone who agreed to proceed. CT imaging was reviewed by Dr. Amada Jupiter  prior to IV thrombolysis administration with no evidence of bleed.  Of note, during the procedure, she had transient cardiac abnormalities including ST elevation which then spontaneously improved.  LKW: 0900 IV thrombolysis given?: Yes @1141  EVT: No LVO Premorbid modified Rankin scale (mRS):  1-No significant post stroke disability and can perform usual duties with    ROS:  Unable to obtain due to altered mental status.   Past Medical History:  Diagnosis Date   Complication of anesthesia    per pt, "hard to wake up past sedation".   COPD (chronic obstructive pulmonary disease) (HCC)    Depression    previous rx, not in years   Hyperlipidemia    Lung cancer (HCC) 2010   Left upper lung- lobectomy   Osteoporosis      Family History  Problem Relation Age of Onset   Epilepsy Mother        hurt at birth, birth trauma reltaed   Hypertension  Father    CVA Father        hemorrhagic   Other Brother        26 possible heart attack   Lung cancer Brother    Breast cancer Neg Hx    Colon cancer Neg Hx    Esophageal cancer Neg Hx    Stomach cancer Neg Hx      Social History:   reports that she quit smoking about 14 years ago. Her smoking use included cigarettes. She has a 40.00 pack-year smoking history. She has never used smokeless tobacco. She reports that she does not drink alcohol and does not use drugs.  Medications  Current Facility-Administered Medications:    0.9 %  sodium chloride infusion, , Intravenous, Continuous, Artis Flock, Denise A, NP   alteplase (ACTIVASE) 1 mg/mL infusion SOLN 56.3 mg, 0.9 mg/kg, Intravenous, Once **FOLLOWED BY** 0.9 %  sodium chloride infusion, 50 mL, Intravenous, Once, Samella Parr, MD   acetaminophen (TYLENOL) tablet 500 mg, 500 mg, Oral, Q6H PRN, Janeece Riggers, NP   atorvastatin (LIPITOR) tablet 80 mg, 80 mg, Oral, Daily, Janeece Riggers, NP   labetalol (NORMODYNE) injection 20 mg, 20 mg, Intravenous, Once **AND** clevidipine (CLEVIPREX) infusion 0.5 mg/mL, 0-21 mg/hr, Intravenous, Continuous, Wolfe, Denise A, NP   docusate sodium (COLACE) capsule 100 mg, 100 mg, Oral, BID PRN, Janeece Riggers, NP   enoxaparin (LOVENOX) injection 40 mg, 40 mg, Subcutaneous, Q24H, Janeece Riggers, NP   naloxone (NARCAN) 0.4 MG/ML  injection, , , ,    naloxone (NARCAN) 0.4 MG/ML injection, , , ,    pantoprazole (PROTONIX) injection 40 mg, 40 mg, Intravenous, QHS, Chelsea Aus, Luke, NP   polyethylene glycol (MIRALAX / GLYCOLAX) packet 17 g, 17 g, Oral, Daily PRN, Janeece Riggers, NP   umeclidinium bromide (INCRUSE ELLIPTA) 62.5 MCG/ACT 1 puff, 1 puff, Inhalation, Daily, Janeece Riggers, NP   Exam: Current vital signs: BP 112/81 (BP Location: Left Arm)   Pulse 80   Temp 97.8 F (36.6 C)   Resp (!) 21   Ht 5\' 3"  (1.6 m)   Wt 62.6 kg   SpO2 97%   BMI 24.45 kg/m  Vital signs in last 24 hours: Temp:  [97.6 F  (36.4 C)-97.8 F (36.6 C)] 97.8 F (36.6 C) (06/24 1021) Pulse Rate:  [80-85] 80 (06/24 1045) Resp:  [18-21] 21 (06/24 1045) BP: (112-176)/(79-82) 112/81 (06/24 1045) SpO2:  [94 %-98 %] 97 % (06/24 1045) Weight:  [62.6 kg] 62.6 kg (06/24 0650)  GENERAL: Critically ill HEENT: - Normocephalic and atraumatic, dry mm, ET tube placed LUNGS - Clear to auscultation bilaterally with no wheezes CV - S1S2 RRR, no m/r/g, equal pulses bilaterally. ABDOMEN - Soft, nontender, nondistended with normoactive BS Ext: warm, well perfused, intact peripheral pulses, no edema  NEURO:  She is unresponsive, eyes closed. Does not respond to voice, does not follow commands. Right pupil slightly larger than left, both are reactive to light, Eyes with rightward gaze, no corneal reflex bilaterally, no facial droop Motor: left arm with extension posturing, right arm with some extension posturing and some movement that appears cortically driven, bilateral lowers withdraw to painful stimuli  Tone: is normal and bulk is normal Sensation- responds to noxious stimuli  Coordination:unable to assess  Gait- deferred  NIHSS 1a Level of Conscious.: 3 1b LOC Questions: 2 1c LOC Commands: 2 2 Best Gaze: 2 3 Visual: 3 4 Facial Palsy: 0 5a Motor Arm - left: 3 5b Motor Arm - Right: 3 6a Motor Leg - Left: 3 6b Motor Leg - Right: 3 7 Limb Ataxia: 0 8 Sensory: 0 9 Best Language: 3 10 Dysarthria: 2 11 Extinct. and Inatten.: 0 TOTAL: 29   Labs I have reviewed labs in epic and the results pertinent to this consultation are:  CBC    Component Value Date/Time   WBC 6.1 02/10/2023 0728   RBC 5.10 02/10/2023 0728   HGB 14.2 02/10/2023 0728   HGB 14.3 01/10/2009 0919   HCT 42.6 02/10/2023 0728   HCT 41.5 01/10/2009 0919   PLT 249 02/10/2023 0728   PLT 315 01/10/2009 0919   MCV 83.5 02/10/2023 0728   MCV 85.6 01/10/2009 0919   MCH 27.8 02/10/2023 0728   MCHC 33.3 02/10/2023 0728   RDW 13.0 02/10/2023 0728    RDW 13.1 01/10/2009 0919   LYMPHSABS 1.8 09/05/2022 0939   LYMPHSABS 1.7 01/10/2009 0919   MONOABS 0.4 09/05/2022 0939   MONOABS 0.4 01/10/2009 0919   EOSABS 0.1 09/05/2022 0939   EOSABS 0.1 01/10/2009 0919   BASOSABS 0.0 09/05/2022 0939   BASOSABS 0.0 01/10/2009 0919    CMP     Component Value Date/Time   NA 137 02/10/2023 0728   K 3.9 02/10/2023 0728   CL 108 02/10/2023 0728   CO2 20 (L) 02/10/2023 0728   GLUCOSE 104 (H) 02/10/2023 0728   GLUCOSE 86 06/30/2006 1130   BUN 12 02/10/2023 0728   CREATININE 0.85 02/10/2023 0728   CALCIUM 8.8 (L)  02/10/2023 0728   PROT 7.1 09/05/2022 0939   ALBUMIN 4.4 09/05/2022 0939   AST 17 09/05/2022 0939   ALT 8 09/05/2022 0939   ALKPHOS 90 09/05/2022 0939   BILITOT 0.8 09/05/2022 0939   GFRNONAA >60 02/10/2023 0728   GFRAA  02/02/2009 0330    >60        The eGFR has been calculated using the MDRD equation. This calculation has not been validated in all clinical situations. eGFR's persistently <60 mL/min signify possible Chronic Kidney Disease.    Lipid Panel     Component Value Date/Time   CHOL 164 09/05/2022 0939   TRIG 93.0 09/05/2022 0939   TRIG 123 06/30/2006 1130   HDL 61.70 09/05/2022 0939   CHOLHDL 3 09/05/2022 0939   VLDL 18.6 09/05/2022 0939   LDLCALC 83 09/05/2022 0939   LDLDIRECT 72.0 01/28/2022 1345      Imaging I have reviewed the images obtained:  CT-head no acute process, multiple foci of air present along midline and left frontal, aspects 10.    CT angio head and neck LVO  Assessment:  72 y.o. female with past medical history of COPD, hyperlipidemia, squamous cell lung cancer, depression, tobacco use had an elective bronchoscopy with biopsy today. After procedure she was not waking up and unresponsive and Code stroke was activated   Recommendations: - ICU admission  - HgbA1c, fasting lipid panel - MRI of the brain without contrast - 24 hr brain imaging s/p TPA scheduled for 1100 on 6/25 -  frequent neuro checks  - Echocardiogram - No Antiplatelet med for 24 hrs s/p TPA - Risk factor modification - Telemetry monitoring - PT consult, OT consult, Speech consult - Stroke team to follow  Gevena Mart DNP, ACNPC-AG  [facility]  I have seen the patient and reviewed the above note.  She has a very concerning exam with extensor posturing on the left and brainstem findings of asymmetric pupils and absent corneals.  At this point, there is some air on her head CT, of unclear significance but suggesting air embolism.  The location of this area would not explain the majority of her symptoms, however, and therefore I do think embolic stroke has to be still in consideration.  Given how devastating her deficits are, I do think IV thrombolytics would be merited.  There is some risk given a tiny biopsy site, however given how small the biopsy was, I think there is relatively low risk of life-threatening bleeding and therefore I discussed risks and benefits of IV thrombolytics with the husband who agreed with proceeding.  Given that there was some risk of bleeding, I opted to treat with tPA rather than tenecteplase given the ability to turn tPA off with a shorter half-life if there was significant bleeding.  I think an MRI will give [place] much more information about what to expect from her prognosis wise.  I do wonder if her cardiac abnormalities could  also be related to embolization with transient cardiac ischemia.  This patient is critically ill and at significant risk of neurological worsening, death and care requires constant monitoring of vital signs, hemodynamics,respiratory and cardiac monitoring, neurological assessment, discussion with family, other specialists and medical decision making of high complexity. I spent 55 minutes of neurocritical care time  in the care of  this patient. This was time spent independent of any time provided by nurse practitioner or PA.  Ritta Slot, MD [facility] [phone]  If 7pm- 7am, please page neurology on  call as listed in [facility]. 02/10/2023  2:58 PM

## 2023-02-10 NOTE — Anesthesia Postprocedure Evaluation (Signed)
Anesthesia Post Note  Patient: [name]  Procedure(s) Performed: ROBOTIC ASSISTED NAVIGATIONAL BRONCHOSCOPY (Bilateral) BRONCHIAL NEEDLE ASPIRATION BIOPSIES BRONCHIAL BRUSHINGS BRONCHIAL BIOPSIES     Patient location during evaluation: PACU Anesthesia Type: General Level of consciousness: patient remains intubated per anesthesia plan and obtunded/minimal responses Pain management: pain level controlled Vital Signs Assessment: post-procedure vital signs reviewed and stable Respiratory status: patient remains intubated per anesthesia plan Cardiovascular status: stable Anesthetic complications: no Comments: Pt reintubated in PACU for code stroke. I came to CT and helped transport to 4N-ICU. Per stroke neurologist she had air noted in her brain. Possible explanation is air entrainment during breath hold/biopsy. Given transient ST and QRS complex changes this would make sense.    No notable events documented.  Last Vitals:  Vitals:   02/10/23 1045 02/10/23 1100  BP: 112/81 (!) 175/70  Pulse: 80 (!) 101  Resp: (!) 21 18  Temp:  36.6 C  SpO2: 97% 98%    Last Pain:  Vitals:   02/10/23 1045  TempSrc:   PainSc: Asleep                 Lewie Loron

## 2023-02-10 NOTE — Progress Notes (Signed)
PHARMACIST CODE STROKE RESPONSE  Notified to mix tPA (alteplase) at 11:24 by Dr. Amada Jupiter tPA (alteplase) preparation completed at 11:28  tPA (alteplase) dose = 56.3mg  total - given as 5.63mg  IV bolus over 1 minute, followed by 50.74ml/hr over 1 hour  Issues/delays encountered (if applicable): tPA was not given to patient until Dr. Amada Jupiter confirmed CT findings with radiology. tPA was given at 11:41.   Wilburn Cornelia, PharmD, BCPS Clinical Pharmacist 02/10/2023 11:55 AM   Please refer to [facility] for pharmacy phone number

## 2023-02-10 NOTE — Discharge Summary (Cosign Needed Addendum)
Physician Discharge Summary       Patient ID: [name] MRN: [id number] DOB/AGE: 08/27/1950 72 y.o.  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 Pt has ground glass pulm nodules that have been followed with serial imaging. Recently a recommendations made to achieve a tissue diagnosis using robotic assisted navigational bronchoscopy. 02/10/2023 pt arrived to [facility] for scheduled flexible video fiberoptic bronchoscopy with robotic assistance and biopsies. Biopsied were obtained. During the case after the biopsy, she had transient cardiac abnormalities including ST elevation, NSVT,  and third degree block, both of which resolved spontaneously. Unfortunately post procedure pt was unresponsive and rapid response/Code stroke were called. LKW 0900 prior to induction. NIHSS 29. Exam concerning for extensor posturing on the left, asymmetric pupils, and absent corneal reflexes. She was reintubated in the PACU. Non-contrast CT of the head was negative for hemorrhage, but did show multiple intracranial foci of air present along the midline and left frontal parenchyma. TPA was administered under the direction of neurology. MRI showed numerous small infarcts throughout the supratentorial brain and to a lesser extent the cerebellum concerning for embolic event given the air emboli seen on CTA. Given the severity of her symptoms and presumptive diagnosis of air embolism. She as considered a possible candidate for hyperbaric treatment. The hyperbaric team at [facility] was contact and has agreed to accept her in transfer.     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   - Transfer to [facility] for hyperbaric therapy - Frequent neuro checks.   Acute hypoxemic respiratory failure secondary to obtundation - Full vent support - Continue propofol infusion and PRN fentanyl for RASS goal -1 to -2.  - Daily WUA and SBT  Lung nodule: Under surveillance for the past 2 years, during which period nodule has doubled in size. 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  CTA head neck 6/24:  Foci of air are noted in the more distal ACA territory, as well as the left ACA/MCA watershed, most likely air emboli.  MRI brain 6/24: Numerous small predominantly cortical infarcts throughout the supratentorial brain and to lesser extent the cerebellum, concerning for embolic infarcts given the air emboli seen on CTA and the history.  Consults  Neurology  Discharge Exam: BP (!) 162/91   Pulse (!) 117   Temp (!) 100.8 F (38.2 C) (Axillary)   Resp 18   Ht 5\' 3"  (1.6 m)   Wt 62.6 kg   SpO2 100%   BMI 24.45 kg/m   General:  elderly female of normal body habitus on mechanical ventilation Neuro:  Sedated RASS -4. Pupils 4mm bilaterally and responsive.  HEENT:  [place]/AT, No JVD noted, PERRL Cardiovascular:  RRR, no MRG Lungs:  Clear bilateral breath sounds Abdomen:  Soft, non-distended Musculoskeletal:  No acute deformity Skin:  Intact,  MMM   Labs at discharge Lab Results  Component Value Date   CREATININE 0.85 02/10/2023   BUN 12 02/10/2023   NA 139 02/10/2023   K 3.0 (L) 02/10/2023   CL 108 02/10/2023   CO2 20 (L) 02/10/2023   Lab Results  Component Value Date   WBC 6.1 02/10/2023   HGB 13.9  02/10/2023   HCT 41.0 02/10/2023   MCV 83.5 02/10/2023   PLT 249 02/10/2023   Lab Results  Component Value Date   ALT 8 09/05/2022   AST 17 09/05/2022   ALKPHOS 90 09/05/2022   BILITOT 0.8 09/05/2022   Lab Results  Component Value Date   INR 0.9 01/26/2009   INR 1.0 12/27/2008    Current radiology studies CT CHEST WO CONTRAST  Result Date: 02/10/2023 CLINICAL DATA:  Pulmonary nodule status post bronchoscopy EXAM: CT CHEST WITHOUT CONTRAST TECHNIQUE: Multidetector CT imaging of the chest was performed following the standard protocol without IV contrast. RADIATION DOSE REDUCTION: This exam was performed according to the departmental dose-optimization program which includes automated exposure control, adjustment of the mA and/or kV according to patient size and/or use of iterative reconstruction technique. COMPARISON:  02/10/2023, 01/06/2023 FINDINGS: Cardiovascular: Unenhanced imaging of the heart is unremarkable without pericardial effusion. Diffuse coronary artery atherosclerosis greatest in the LAD distribution. Normal caliber of the thoracic aorta. Stable atherosclerosis of the aorta. Evaluation of the vascular lumen is limited without IV contrast. Mediastinum/Nodes: Endotracheal tube identified, tip approximately 2 cm above carina. Thyroid and esophagus are unremarkable. No pathologic adenopathy. Lungs/Pleura: The left lower lobe nodule seen previously is obscured by surrounding ground-glass airspace disease, consistent with post biopsy hemorrhage. A punctate focus of gas is seen at the site of prior nodule, reference image 75/4, consistent with recent biopsy. The ground-glass nodule seen previously within the right upper and right lower lobes are not appreciably changed, measuring 10 mm and 9 mm respectively reference image 55/4. No effusion or pneumothorax. Central airways are patent. Prior left upper lobectomy. Upper Abdomen: No acute upper abdominal findings. Musculoskeletal: No acute or  destructive bony abnormalities. Reconstructed images demonstrate no additional findings. IMPRESSION: 1. Postprocedural changes from left lower lobe bronchoscopic biopsy, with a small amount of perilesional hemorrhage and punctate focus of gas at the site of biopsy. 2. No evidence of pneumothorax. 3. Stable ground-glass nodules within the right upper and right lower lobe. 4. Aortic Atherosclerosis (ICD10-I70.0). Coronary artery atherosclerosis. Electronically Signed   By: Sharlet Salina M.D.   On: 02/10/2023 20:26   DG Chest Port 1 View  Result Date: 02/10/2023 CLINICAL DATA:  Intubated EXAM: PORTABLE CHEST 1 VIEW COMPARISON:  02/10/2023 FINDINGS: Single frontal view of the chest demonstrates endotracheal tube overlying tracheal air column, tip 2.9 cm above carina. Cardiac silhouette is unremarkable. No acute airspace disease, effusion, or pneumothorax. The sub solid pulmonary nodule seen on previous chest CT are occult by x-ray. IMPRESSION: 1. No complication after intubation. 2. Sub solid pulmonary nodule seen on recent CT are not well visualized by x-ray. Electronically Signed   By: Sharlet Salina M.D.   On: 02/10/2023 16:27   MR BRAIN WO CONTRAST  Result Date: 02/10/2023 EXAM: MRI HEAD WITHOUT CONTRAST TECHNIQUE: Multiplanar, multiecho pulse sequences of the brain and surrounding structures were obtained without intravenous contrast. COMPARISON:  Same day CT head and CTA. FINDINGS: Brain: Extensive small foci of restricted diffusion which are predominantly cortical involving right greater than left cerebral cortex including ACA MCA and PCA territories. Also, to a lesser  extent there are multiple small acute infarcts in the cerebellum. Mild edema. No mass effect. Additional scattered T2/FLAIR hyperintensities in the white matter compatible with chronic microvascular ischemic disease. No evidence of acute hemorrhage, mass lesion, or hydrocephalus. Vascular: Major arterial flow voids are maintained. Skull  and upper cervical spine: Normal marrow signal. Sinuses/Orbits: Clear sinuses.  No acute orbital findings. IMPRESSION: Numerous small predominantly cortical infarcts throughout the supratentorial brain and to lesser extent the cerebellum, concerning for embolic infarcts given the air emboli seen on CTA and the history. These results will be called to the ordering clinician or representative by the Radiologist Assistant, and communication documented in the PACS or [employer]. Electronically Signed   By: Feliberto Harts M.D.   On: 02/10/2023 15:24   EEG adult  Result Date: 02/10/2023 Charlsie Quest, MD     02/10/2023  2:01 PM Patient Name: [name] MRN: [id number] Epilepsy Attending: Charlsie Quest Referring Physician/Provider: Rejeana Brock, MD Date: 02/10/2023 Duration: 26.02 mins Patient history: 72 y.o. female with past medical history of COPD, hyperlipidemia, squamous cell lung cancer, depression, tobacco use had an elective bronchoscopy with biopsy today.  However postprocedure she was noted to be unresponsive and unable to wake up. EEG to evaluate for seizure. Level of alertness: comatose AEDs during EEG study: Propofol Technical aspects: This EEG study was done with scalp electrodes positioned according to the 10-20 International system of electrode placement. Electrical activity was reviewed with band pass filter of 1-70Hz , sensitivity of 7 uV/mm, display speed of 30mm/sec with a 60Hz  notched filter applied as appropriate. EEG data were recorded continuously and digitally stored.  Video monitoring was available and reviewed as appropriate. Description: EEG showed continuous generalized background suppression, not reactive to stimulation. Hyperventilation and photic stimulation were not performed.   ABNORMALITY - Background suppression, generalized IMPRESSION: This study is suggestive of profound diffuse encephalopathy, nonspecific etiology. No seizures or epileptiform discharges  were seen throughout the recording. Priyanka Annabelle Harman   CT ANGIO HEAD NECK W WO CM (CODE STROKE)  Result Date: 02/10/2023 CLINICAL DATA:  Stroke suspected, status post bronchoscopy EXAM: CT ANGIOGRAPHY HEAD AND NECK WITH AND WITHOUT CONTRAST TECHNIQUE: Multidetector CT imaging of the head and neck was performed using the standard protocol during bolus administration of intravenous contrast. Multiplanar CT image reconstructions and MIPs were obtained to evaluate the vascular anatomy. Carotid stenosis measurements (when applicable) are obtained utilizing NASCET criteria, using the distal internal carotid diameter as the denominator. RADIATION DOSE REDUCTION: This exam was performed according to the departmental dose-optimization program which includes automated exposure control, adjustment of the mA and/or kV according to patient size and/or use of iterative reconstruction technique. CONTRAST:  75mL OMNIPAQUE IOHEXOL 350 MG/ML SOLN COMPARISON:  No prior CTA available, correlation is made with CT head 02/10/2023 FINDINGS: CT HEAD FINDINGS For noncontrast findings, please see same day CT head. CTA NECK FINDINGS Aortic arch: Standard branching. Imaged portion shows no evidence of aneurysm or dissection. No significant stenosis of the major arch vessel origins. Mild aortic atherosclerosis. Right carotid system: No evidence of dissection, occlusion, or hemodynamically significant stenosis (greater than 50%). Left carotid system: No evidence of dissection, occlusion, or hemodynamically significant stenosis (greater than 50%). Vertebral arteries: No evidence of dissection, occlusion, or hemodynamically significant stenosis (greater than 50%). Skeleton: No acute osseous abnormality. Degenerative changes in the cervical spine. Other neck: Endotracheal tube noted, which terminates less than 1 cm from the carina. A small amount of endotracheal debris is noted at the  distal aspect of the endotracheal to. Otherwise negative.  Upper chest: Emphysema. Right apical scarring. No focal pulmonary opacity or pleural effusion. Review of the MIP images confirms the above findings CTA HEAD FINDINGS Anterior circulation: Both internal carotid arteries are patent to the termini, without significant stenosis. Possible tiny focus of air medial to the left cavernous ACA (series 5, image 118), favored to be in the cavernous sinus. A1 segments patent. Normal anterior communicating artery. Anterior cerebral arteries are patent to their distal aspects without significant stenosis. Foci of air are noted in the more distal ACA territory (series 5, image 56 and 71), as well as the left ACA/MCA watershed (series 5, image 49). No M1 stenosis or occlusion. MCA branches perfused to their distal aspects without significant stenosis. Posterior circulation: Vertebral arteries patent to the vertebrobasilar junction without significant stenosis. Posterior inferior cerebellar arteries patent proximally. Basilar patent to its distal aspect without significant stenosis. Superior cerebellar arteries patent proximally. Patent P1 segments. PCAs perfused to their distal aspects without significant stenosis. The left posterior communicating arteries patent. Venous sinuses: As permitted by contrast timing, patent. Anatomic variants: None significant. Review of the MIP images confirms the above findings IMPRESSION: 1. No intracranial large vessel occlusion or significant stenosis. 2. No hemodynamically significant stenosis in the neck. 3. Foci of air are noted in the more distal ACA territory, as well as the left ACA/MCA watershed, most likely air emboli. This is favored to be secondary to recent bronchoscopy. 4. Endotracheal tube terminates less than 1 cm from the carina. A small amount of endotracheal debris is noted at the distal aspect of the endotracheal tube. 5. Aortic atherosclerosis and emphysema. Aortic Atherosclerosis (ICD10-I70.0) and Emphysema (ICD10-J43.9).  Impression #3 was communicated on 02/10/2023 at 11:52 am to provider Dr. Amada Jupiter via secure text paging. Electronically Signed   By: Wiliam Ke M.D.   On: 02/10/2023 11:53   CT HEAD CODE STROKE WO CONTRAST  Addendum Date: 02/10/2023   ADDENDUM REPORT: 02/10/2023 11:44 ADDENDUM: Multiple intracranial foci of air present along the midline and left frontal parenchyma. Please see dedicated CT angiogram for further discussion of intravascular air. Electronically Signed   By: Marin Roberts M.D.   On: 02/10/2023 11:44   Result Date: 02/10/2023 CLINICAL DATA:  Code stroke. Neuro deficit, acute, stroke suspected. Status post video bronchoscopy with robotic assisted bronchoscopic navigation EXAM: CT HEAD WITHOUT CONTRAST TECHNIQUE: Contiguous axial images were obtained from the base of the skull through the vertex without intravenous contrast. RADIATION DOSE REDUCTION: This exam was performed according to the departmental dose-optimization program which includes automated exposure control, adjustment of the mA and/or kV according to patient size and/or use of iterative reconstruction technique. COMPARISON:  MR head without contrast 01/10/2009 FINDINGS: Brain: Periventricular and subcortical white matter hypoattenuation has progressed since the prior exam. No acute cortical infarct present. No acute hemorrhage or mass lesion is present. The ventricles are of normal size. Deep brain nuclei are within normal limits. No significant extraaxial fluid collection is present. Insert normal brainstem Midline structures are within normal limits. Vascular: Atherosclerotic calcifications are present within the cavernous internal carotid arteries bilaterally. No hyperdense vessel is present. Skull: Calvarium is intact. No focal lytic or blastic lesions are present. No significant extracranial soft tissue lesion is present. Sinuses/Orbits: The paranasal sinuses and mastoid air cells are clear. The globes and orbits are  within normal limits. ASPECTS [facility] Stroke Program Early CT Score) - Ganglionic level infarction (caudate, lentiform nuclei, internal capsule, insula, M1-M3 cortex): 7/7 -  Supraganglionic infarction (M4-M6 cortex): 3/3 Total score (0-10 with 10 being normal): 10/10 IMPRESSION: 1. Progressive periventricular and subcortical white matter hypoattenuation. This likely reflects the sequela of chronic microvascular ischemia. 2. No acute intracranial abnormality. 3. Aspects is 10/10. Electronically Signed: By: Marin Roberts M.D. On: 02/10/2023 11:30   DG Chest Port 1 View  Result Date: 02/10/2023 CLINICAL DATA:  [id number] S/P bronchoscopy with biopsy [id number] EXAM: PORTABLE CHEST 1 VIEW COMPARISON:  01/06/2023 FINDINGS: The heart size and mediastinal contours are within normal limits. Aortic atherosclerosis. Mild streaky bibasilar opacities. No pleural effusion or pneumothorax. The visualized skeletal structures are unremarkable. IMPRESSION: No pneumothorax. Electronically Signed   By: Duanne Guess D.O.   On: 02/10/2023 11:29   DG C-ARM BRONCHOSCOPY  Result Date: 02/10/2023 C-ARM BRONCHOSCOPY: Fluoroscopy was utilized by the requesting physician.  No radiographic interpretation.   DG C-Arm 1-60 Min-No Report  Result Date: 02/10/2023 Fluoroscopy was utilized by the requesting physician.  No radiographic interpretation.    Disposition:  Discharge disposition: 02-Transferred to [facility]        Allergies as of 02/10/2023       Reactions   Codeine    REACTION: nausea   Penicillins    REACTION: rash        Medication List     TAKE these medications    acetaminophen 500 MG tablet Commonly known as: TYLENOL Take 500 mg by mouth every 6 (six) hours as needed.   albuterol 108 (90 Base) MCG/ACT inhaler Commonly known as: ProAir HFA Inhale 1-2 puffs into the lungs every 6 (six) hours as needed for wheezing or shortness of breath. Reported on 02/21/2016   atorvastatin 80  MG tablet Commonly known as: LIPITOR Take 1 tablet (80 mg total) by mouth daily.   Calcium-Magnesium-Vitamin D 600-40-500 MG-MG-UNIT Tb24 Take by mouth.   clevidipine 0.5 MG/ML Emul Commonly known as: CLEVIPREX Inject 0-21 mg/hr into the vein continuous.   CO Q 10 PO Take 1 capsule by mouth daily.   docusate sodium 100 MG capsule Commonly known as: COLACE Take 1 capsule (100 mg total) by mouth 2 (two) times daily as needed for mild constipation.   docusate 50 MG/5ML liquid Commonly known as: COLACE Place 10 mLs (100 mg total) into feeding tube 2 (two) times daily. Start taking on: February 11, 2023   fentaNYL 50 MCG/ML injection Commonly known as: SUBLIMAZE Inject 0.5 mLs (25 mcg total) into the vein every 15 (fifteen) minutes as needed (to achieve RASS & CPOT goal.).   fentaNYL 50 MCG/ML injection Commonly known as: SUBLIMAZE Inject 0.5-2 mLs (25-100 mcg total) into the vein every 30 (thirty) minutes as needed (to maintain RASS & CPOT goal.).   labetalol 5 MG/ML injection Commonly known as: NORMODYNE Inject 4 mLs (20 mg total) into the vein once for 1 dose. Start taking on: February 11, 2023   meloxicam 15 MG tablet Commonly known as: MOBIC Take 1 tablet (15 mg total) by mouth daily as needed.   mouth rinse Liqd solution 15 mLs by Mouth Rinse route as needed (oral care).   mouth rinse Liqd solution 15 mLs by Mouth Rinse route every 2 (two) hours. Start taking on: February 11, 2023   pantoprazole 40 MG injection Commonly known as: PROTONIX Inject 40 mg into the vein at bedtime. Start taking on: February 11, 2023   polyethylene glycol 17 g packet Commonly known as: MIRALAX / GLYCOLAX Take 17 g by mouth daily as needed for moderate constipation.  polyethylene glycol 17 g packet Commonly known as: MIRALAX / GLYCOLAX Place 17 g into feeding tube daily. Start taking on: February 11, 2023   propofol 1000 MG/100ML Emul injection Commonly known as: DIPRIVAN Inject 0-3,130 mcg/min  into the vein continuous.   Spiriva Respimat 1.25 MCG/ACT Aers Generic drug: Tiotropium Bromide Monohydrate Inhale 2 puffs into the lungs daily.         Discharged Condition: critical  46 minutes of time have been dedicated to discharge assessment, planning and discharge instructions.   Signed:  Joneen Roach, AGACNP-BC [facility]  See Amion for personal pager PCCM on call pager [phone] until 7pm. Please call Elink 7p-7a. [phone]  02/10/2023 11:54 PM

## 2023-02-10 NOTE — Anesthesia Procedure Notes (Signed)
Procedure Name: Intubation Date/Time: 02/10/2023 11:00 AM  Performed by: Lewie Loron, MDPre-anesthesia Checklist: Patient identified, Emergency Drugs available, Suction available and Patient being monitored Patient Re-evaluated:Patient Re-evaluated prior to induction Oxygen Delivery Method: Circle system utilized Preoxygenation: Pre-oxygenation with 100% oxygen Induction Type: IV induction Ventilation: Mask ventilation without difficulty Laryngoscope Size: Mac and 3 Grade View: Grade II Tube type: Oral Tube size: 7.5 mm Number of attempts: 1 Airway Equipment and Method: Stylet and Oral airway Placement Confirmation: ETT inserted through vocal cords under direct vision, positive ETCO2 and breath sounds checked- equal and bilateral Secured at: 21 cm Tube secured with: Tape Dental Injury: Teeth and Oropharynx as per pre-operative assessment

## 2023-02-10 NOTE — Progress Notes (Signed)
EEG complete - results pending 

## 2023-02-10 NOTE — Progress Notes (Addendum)
eLink Physician-Brief Progress Note Patient Name: [name] DOB: 25-Apr-1951 MRN: [id number]   Date of Service  02/10/2023  HPI/Events of Note  Notified of pupils being larger but are equal and brisk reaction to light.   Pt is intubated and sedated.  EEG showing profound diffuse encephalopathy.  eICU Interventions  Continue to monitor.         Larinda Buttery 02/10/2023, 11:26 PM

## 2023-02-11 DIAGNOSIS — J9601 Acute respiratory failure with hypoxia: Secondary | ICD-10-CM

## 2023-02-11 DIAGNOSIS — T790XXA Air embolism (traumatic), initial encounter: Secondary | ICD-10-CM

## 2023-02-11 DIAGNOSIS — R4182 Altered mental status, unspecified: Secondary | ICD-10-CM

## 2023-02-11 LAB — POCT I-STAT 7, (LYTES, BLD GAS, ICA,H+H)
Acid-base deficit: 3 mmol/L — ABNORMAL HIGH (ref 0.0–2.0)
Bicarbonate: 21.9 mmol/L (ref 20.0–28.0)
Calcium, Ion: 1.12 mmol/L — ABNORMAL LOW (ref 1.15–1.40)
HCT: 41 % (ref 36.0–46.0)
Hemoglobin: 13.9 g/dL (ref 12.0–15.0)
O2 Saturation: 100 %
Patient temperature: 98.8
Potassium: 3.5 mmol/L (ref 3.5–5.1)
Sodium: 138 mmol/L (ref 135–145)
TCO2: 23 mmol/L (ref 22–32)
pCO2 arterial: 39 mmHg (ref 32–48)
pH, Arterial: 7.358 (ref 7.35–7.45)
pO2, Arterial: 614 mmHg — ABNORMAL HIGH (ref 83–108)

## 2023-02-11 MED ORDER — FENTANYL 2500MCG IN NS 250ML (10MCG/ML) PREMIX INFUSION: 25.0000 ug/h | INTRAVENOUS | 0 refills | Status: DC

## 2023-02-11 MED ORDER — FENTANYL CITRATE PF 50 MCG/ML IJ SOSY: 25.0000 ug | PREFILLED_SYRINGE | Freq: Once | INTRAMUSCULAR | 0 refills | Status: AC

## 2023-02-11 MED ORDER — FENTANYL CITRATE PF 50 MCG/ML IJ SOSY: 25.0000 ug | PREFILLED_SYRINGE | Freq: Once | INTRAMUSCULAR | Status: DC

## 2023-02-11 MED ORDER — FENTANYL BOLUS VIA INFUSION: 25.0000 ug | INTRAVENOUS | 0 refills | Status: DC | PRN

## 2023-02-11 MED ORDER — FENTANYL BOLUS VIA INFUSION: 25.0000 ug | INTRAVENOUS | Status: DC | PRN

## 2023-02-11 MED ORDER — FENTANYL 2500MCG IN NS 250ML (10MCG/ML) PREMIX INFUSION
25.0000 ug/h | INTRAVENOUS | Status: DC
  Administered 2023-02-11: 50 ug/h via INTRAVENOUS
  Filled 2023-02-11: qty 250

## 2023-02-11 NOTE — Progress Notes (Signed)
Patient transferred with [facility].

## 2023-02-12 LAB — CYTOLOGY - NON PAP

## 2023-02-14 ENCOUNTER — Encounter (HOSPITAL_COMMUNITY): Payer: Self-pay | Admitting: Emergency Medicine

## 2023-02-17 DEATH — deceased

## 2023-02-18 ENCOUNTER — Ambulatory Visit: Payer: Medicare PPO | Admitting: Acute Care

## 2023-09-15 ENCOUNTER — Encounter: Payer: Medicare PPO | Admitting: Family Medicine
